# Patient Record
Sex: Male | Born: 1945 | Race: White | Hispanic: No | Marital: Single | Smoking: Former smoker
Health system: Southern US, Community
[De-identification: ages and names within clinical notes are randomized; demographics above are authoritative.]

## PROBLEM LIST (undated history)

## (undated) DIAGNOSIS — F329 Major depressive disorder, single episode, unspecified: Secondary | ICD-10-CM

## (undated) DIAGNOSIS — E785 Hyperlipidemia, unspecified: Secondary | ICD-10-CM

## (undated) DIAGNOSIS — R519 Headache, unspecified: Secondary | ICD-10-CM

## (undated) DIAGNOSIS — B1920 Unspecified viral hepatitis C without hepatic coma: Secondary | ICD-10-CM

## (undated) DIAGNOSIS — I639 Cerebral infarction, unspecified: Secondary | ICD-10-CM

## (undated) DIAGNOSIS — R51 Headache: Secondary | ICD-10-CM

## (undated) DIAGNOSIS — F32A Depression, unspecified: Secondary | ICD-10-CM

## (undated) HISTORY — PX: FOOT SURGERY: SHX648

## (undated) HISTORY — PX: CHOLECYSTECTOMY: SHX55

---

## 1997-06-30 ENCOUNTER — Ambulatory Visit (HOSPITAL_COMMUNITY): Admission: RE | Admit: 1997-06-30 | Discharge: 1997-06-30 | Payer: Self-pay | Admitting: Specialist

## 1997-07-12 ENCOUNTER — Ambulatory Visit (HOSPITAL_BASED_OUTPATIENT_CLINIC_OR_DEPARTMENT_OTHER): Admission: RE | Admit: 1997-07-12 | Discharge: 1997-07-12 | Payer: Self-pay | Admitting: Specialist

## 1998-02-04 ENCOUNTER — Emergency Department (HOSPITAL_COMMUNITY): Admission: EM | Admit: 1998-02-04 | Discharge: 1998-02-04 | Payer: Self-pay | Admitting: Emergency Medicine

## 1998-02-04 ENCOUNTER — Encounter: Payer: Self-pay | Admitting: Emergency Medicine

## 2005-08-29 ENCOUNTER — Encounter: Admission: RE | Admit: 2005-08-29 | Discharge: 2005-08-29 | Payer: Self-pay | Admitting: Orthopedic Surgery

## 2006-04-26 ENCOUNTER — Encounter: Admission: RE | Admit: 2006-04-26 | Discharge: 2006-04-26 | Payer: Self-pay | Admitting: Internal Medicine

## 2006-05-27 ENCOUNTER — Ambulatory Visit (HOSPITAL_COMMUNITY): Admission: RE | Admit: 2006-05-27 | Discharge: 2006-05-27 | Payer: Self-pay | Admitting: General Surgery

## 2006-06-26 ENCOUNTER — Ambulatory Visit: Payer: Self-pay | Admitting: Gastroenterology

## 2006-07-10 ENCOUNTER — Ambulatory Visit: Payer: Self-pay | Admitting: Gastroenterology

## 2006-08-01 ENCOUNTER — Encounter: Admission: RE | Admit: 2006-08-01 | Discharge: 2006-08-01 | Payer: Self-pay | Admitting: Internal Medicine

## 2007-06-17 ENCOUNTER — Encounter: Admission: RE | Admit: 2007-06-17 | Discharge: 2007-06-17 | Payer: Self-pay | Admitting: Internal Medicine

## 2008-07-14 ENCOUNTER — Ambulatory Visit (HOSPITAL_COMMUNITY): Admission: RE | Admit: 2008-07-14 | Discharge: 2008-07-14 | Payer: Self-pay | Admitting: General Surgery

## 2008-07-15 ENCOUNTER — Ambulatory Visit (HOSPITAL_COMMUNITY): Admission: RE | Admit: 2008-07-15 | Discharge: 2008-07-16 | Payer: Self-pay | Admitting: General Surgery

## 2008-07-15 ENCOUNTER — Encounter (INDEPENDENT_AMBULATORY_CARE_PROVIDER_SITE_OTHER): Payer: Self-pay | Admitting: General Surgery

## 2010-04-01 ENCOUNTER — Encounter (HOSPITAL_BASED_OUTPATIENT_CLINIC_OR_DEPARTMENT_OTHER): Payer: Self-pay | Admitting: Internal Medicine

## 2010-06-20 LAB — CBC
HCT: 42.3 % (ref 39.0–52.0)
Hemoglobin: 14.4 g/dL (ref 13.0–17.0)
MCHC: 34.1 g/dL (ref 30.0–36.0)
MCV: 83.9 fL (ref 78.0–100.0)
RBC: 5.04 MIL/uL (ref 4.22–5.81)

## 2010-06-20 LAB — DIFFERENTIAL
Basophils Absolute: 0 10*3/uL (ref 0.0–0.1)
Lymphocytes Relative: 23 % (ref 12–46)
Lymphs Abs: 1.8 10*3/uL (ref 0.7–4.0)
Neutro Abs: 4.8 10*3/uL (ref 1.7–7.7)
Neutrophils Relative %: 62 % (ref 43–77)

## 2010-06-20 LAB — COMPREHENSIVE METABOLIC PANEL
BUN: 7 mg/dL (ref 6–23)
CO2: 27 mEq/L (ref 19–32)
Calcium: 9.1 mg/dL (ref 8.4–10.5)
Chloride: 106 mEq/L (ref 96–112)
Creatinine, Ser: 0.89 mg/dL (ref 0.4–1.5)
GFR calc Af Amer: >60 mL/min (ref >60)
GFR calc non Af Amer: >60 mL/min (ref >60)
Glucose, Bld: 94 mg/dL (ref 70–99)
Total Bilirubin: 1.3 mg/dL — ABNORMAL HIGH (ref 0.3–1.2)

## 2010-06-20 LAB — PROTIME-INR
INR: 0.9 (ref 0.00–1.49)
Prothrombin Time: 12.5 seconds (ref 11.6–15.2)

## 2010-07-25 NOTE — Op Note (Signed)
NAME:  Neil Lambert, Neil Lambert NO.:  [id number]   MEDICAL RECORD NO.:  [id number]          PATIENT TYPE:  OUT   LOCATION:  CATS                         FACILITY:  [facility]   PHYSICIAN:  Neil Lambert, M.D.DATE OF BIRTH:  07-22-1945   DATE OF PROCEDURE:  07/15/2008  DATE OF DISCHARGE:  07/14/2008                               OPERATIVE REPORT   PREOPERATIVE DIAGNOSIS:  Symptomatic cholelithiasis.   POSTOPERATIVE DIAGNOSIS:  Symptomatic cholelithiasis.   PROCEDURE:  Laparoscopic cholecystectomy with intraoperative  cholangiogram.   SURGEON:  Neil Lambert, M.D.   ASSISTANT:  Neil Loron, MD   ANESTHESIA:  General.   INDICATIONS:  [name] is a 65 year old male who has known history of  gallstones.  He is been having intermittent episodes of right upper  quadrant pain at times.  He has mild elevation of some of his liver  function tests.  He does have a positive hepatitis C antibody.  Because  of this, it was felt that he may be symptomatic from his gallstones.  He  now presents for elective cholecystectomy.  We discussed the procedure,  risks, and aftercare preoperatively.   TECHNIQUE:  He was brought to the operating room, placed supine on the  operating table and general anesthetic was administered.  The abdominal  wall hair was clipped and the abdominal wall sterilely prepped and  draped.  Dilute Marcaine was infiltrated in the infraumbilical region.  A small infraumbilical incision was made through the skin, subcutaneous  tissue, fascia, and peritoneum entering the peritoneal cavity under  direct vision.  A  purse-string suture of 0 Vicryl was placed around the  fascial edges.  A Hasson trocar was introduced to the peritoneal cavity  and pneumoperitoneum was created by insufflation of CO2 gas.   Next laparoscope was introduced.  The liver did not appear to be  abnormal.  There were some adhesions between the omentum and the  gallbladder.  He  was placed in a reverse Trendelenburg position and  right side tilted slightly up.  An 11-mm trocar was placed through an  epigastric incision, and two 5-mm trocars were placed in the right upper  quadrant.  Using blunt dissection the omental adhesions were taken off  the gallbladder.  The fundus of the gallbladder was grasped and  retracted to the right shoulder.  Using blunt dissection, I mobilized  the infundibulum of the gallbladder.  I identified the cystic duct and a  cystic artery.  Using blunt dissection, windows were created around both  of these.  I then clipped the cystic duct at the cystic duct gallbladder  junction.  The cystic artery was clipped and divided.  A critical view  was achieved.  A small incision was made in the cystic duct and some  bile milked back from it.  A cholangiocath was passed to the anterior  abdominal wall and placed to the cystic duct and cholangiogram was  performed.   Under real-time fluoroscopy, dilute contrast was injected into the  cystic duct which was of moderate length.  The common hepatic, right  left hepatic, common bile ducts  were all filled and contrast drained  promptly into the duodenum.  I did not see any obvious obstruction  points.  Final reports pending radiologist's interpretation.   The cholangiocatheter was removed, the cystic duct was clipped 3 times  on the biliary side and divided.  Using electrocautery, the gallbladder  was dissected off from the liver intact and placed in Endopouch bag.   The gallbladder fossa was irrigated and bleeding points were controlled  with electrocautery.  Surgicel was placed in the gallbladder fossa.  The  gallbladder was then removed through the subumbilical port in the  Endopouch bag and the trocar replaced.   Then copiously irrigated out the pelvic area and hemostasis was  adequate.  The fluid was evacuated by way of suction.  I then removed  the Hasson trocar under direct vision, closed the  fascial defect by  tightening up and tying down the purse-string suture.  The remaining  trocars removed and pneumoperitoneum was released.   Skin incisions were closed with 4-0 Monocryl subcuticular stitches,  followed by Steri-Strips and sterile dressings.  He tolerated the  procedure without apparent complications and was taken to recovery in  satisfactory condition.      Neil Lambert, M.D.  Electronically Signed     TJR/MEDQ  D:  07/15/2008  T:  07/16/2008  Job:  [id number]   cc:   Neil Lambert. Neil Lambert, M.D.

## 2010-07-28 NOTE — Op Note (Signed)
NAME:  Neil Lambert, Neil Lambert              ACCOUNT NO.:  [id number]   MEDICAL RECORD NO.:  [id number]          PATIENT TYPE:  AMB   LOCATION:  DAY                          FACILITY:  [facility]   PHYSICIAN:  Timothy E. Earlene Plater, M.D. DATE OF BIRTH:  02-Aug-1945   DATE OF PROCEDURE:  05/27/2006  DATE OF DISCHARGE:                               OPERATIVE REPORT   PREOP DIAGNOSES:  1. Anal fissure.  2. Prolapsing hemorrhoid   PREOPERATIVE DIAGNOSES:  1. Anal fissure.  2. Prolapsing polyp.   OPERATIVE PROCEDURE:  1. Exam under anesthesia.  2. Proctoscopy.  3. Anoscopy with band ligation of polyp and repair of anal fissure.   SURGEON:  Timothy E. Earlene Plater, M.D.   ANESTHESIA:  General.   DESCRIPTION OF PROCEDURE:  Neil Lambert is 15.  He has a history of a  traumatic stroke.  He has recovered.  He is competent and fully  employed.  He lives alone.  He is stoic and takes care of his own  matters.  He has developed a painful anal condition, anal fissure, with  only partial response to diltiazem.  When seen and followed in the  office, he also had a prolapsing hemorrhoid.  His anorectal area was  tender, and difficult to examine; and so after careful explanation he  elected to proceed with this surgery.  He was seen, identified, and the  permit signed.   He was taken to the operating room.  He placed himself supine.  LMA  anesthesia provided.  He was gently raised to lithotomy.  The perianal  area was inspected, prepped and draped.  There was a prolapsing rectal  mucosal mass from the left posterior region of the anus.  The  proctoscope was introduced and advanced to 20 cm.  The rectal mucosa  being normal.  The scope removed.  Anoscope inserted and indeed the  prolapsing rectal mass was an inflammatory polyp, off to the tip and  left side of the anal fissure.   A band ligation device was applied on the rectal mucosa; and that  sufficiently reduced that mucosal mass.  The scope was turned and the  posterior anal fissure was cauterized.  It was minimal, at this point.  A left posterior internal sphincterotomy accomplished percutaneously  with a 15-blade.  Care taken to avoid the external sphincter which was  left intact.  Anal area injected round and about with Marcaine,  epinephrine, and Wydase, massaged in  well.  Gelfoam gauze and dry sterile dressing were applied.  He  tolerated it well; was removed to recovery room in good condition.  He  received a predrug counseling regards postop instructions, pain  medications, and printed information is given.  He will be followed by  phone and in the office as needed.      Timothy E. Earlene Plater, M.D.  Electronically Signed     TED/MEDQ  D:  05/27/2006  T:  05/27/2006  Job:  [id number]   cc:   Barry Dienes. Eloise Harman, M.D.  Fax: [phone]

## 2010-12-06 DIAGNOSIS — B182 Chronic viral hepatitis C: Secondary | ICD-10-CM | POA: Insufficient documentation

## 2011-03-27 ENCOUNTER — Encounter: Payer: Self-pay | Admitting: Gastroenterology

## 2013-12-15 DIAGNOSIS — G8190 Hemiplegia, unspecified affecting unspecified side: Secondary | ICD-10-CM | POA: Insufficient documentation

## 2015-10-27 ENCOUNTER — Ambulatory Visit: Payer: Self-pay | Admitting: Podiatry

## 2015-12-13 ENCOUNTER — Emergency Department (HOSPITAL_COMMUNITY): Payer: Medicare Other

## 2015-12-13 ENCOUNTER — Inpatient Hospital Stay (HOSPITAL_COMMUNITY)
Admission: EM | Admit: 2015-12-13 | Discharge: 2015-12-16 | DRG: 057 | Disposition: A | Discharge status: Skilled Nursing Facility | Payer: Medicare Other | Attending: Family Medicine | Admitting: Family Medicine

## 2015-12-13 ENCOUNTER — Encounter (HOSPITAL_COMMUNITY): Payer: Self-pay | Admitting: Emergency Medicine

## 2015-12-13 DIAGNOSIS — Z8673 Personal history of transient ischemic attack (TIA), and cerebral infarction without residual deficits: Secondary | ICD-10-CM | POA: Diagnosis not present

## 2015-12-13 DIAGNOSIS — M545 Low back pain: Secondary | ICD-10-CM | POA: Diagnosis present

## 2015-12-13 DIAGNOSIS — F32A Depression, unspecified: Secondary | ICD-10-CM

## 2015-12-13 DIAGNOSIS — G8191 Hemiplegia, unspecified affecting right dominant side: Secondary | ICD-10-CM

## 2015-12-13 DIAGNOSIS — R296 Repeated falls: Secondary | ICD-10-CM | POA: Diagnosis present

## 2015-12-13 DIAGNOSIS — F419 Anxiety disorder, unspecified: Secondary | ICD-10-CM | POA: Diagnosis present

## 2015-12-13 DIAGNOSIS — B1920 Unspecified viral hepatitis C without hepatic coma: Secondary | ICD-10-CM | POA: Diagnosis present

## 2015-12-13 DIAGNOSIS — R2681 Unsteadiness on feet: Secondary | ICD-10-CM | POA: Diagnosis not present

## 2015-12-13 DIAGNOSIS — E785 Hyperlipidemia, unspecified: Secondary | ICD-10-CM | POA: Diagnosis present

## 2015-12-13 DIAGNOSIS — Z885 Allergy status to narcotic agent status: Secondary | ICD-10-CM

## 2015-12-13 DIAGNOSIS — M4802 Spinal stenosis, cervical region: Secondary | ICD-10-CM | POA: Diagnosis present

## 2015-12-13 DIAGNOSIS — R29898 Other symptoms and signs involving the musculoskeletal system: Secondary | ICD-10-CM

## 2015-12-13 DIAGNOSIS — M25551 Pain in right hip: Secondary | ICD-10-CM | POA: Diagnosis present

## 2015-12-13 DIAGNOSIS — R740 Nonspecific elevation of levels of transaminase and lactic acid dehydrogenase [LDH]: Secondary | ICD-10-CM

## 2015-12-13 DIAGNOSIS — Z882 Allergy status to sulfonamides status: Secondary | ICD-10-CM

## 2015-12-13 DIAGNOSIS — R531 Weakness: Secondary | ICD-10-CM | POA: Diagnosis present

## 2015-12-13 DIAGNOSIS — Z66 Do not resuscitate: Secondary | ICD-10-CM | POA: Diagnosis present

## 2015-12-13 DIAGNOSIS — G8929 Other chronic pain: Secondary | ICD-10-CM | POA: Diagnosis present

## 2015-12-13 DIAGNOSIS — M479 Spondylosis, unspecified: Secondary | ICD-10-CM | POA: Diagnosis present

## 2015-12-13 DIAGNOSIS — R350 Frequency of micturition: Secondary | ICD-10-CM | POA: Diagnosis present

## 2015-12-13 DIAGNOSIS — R3915 Urgency of urination: Secondary | ICD-10-CM | POA: Diagnosis present

## 2015-12-13 DIAGNOSIS — S92514A Nondisplaced fracture of proximal phalanx of right lesser toe(s), initial encounter for closed fracture: Secondary | ICD-10-CM

## 2015-12-13 DIAGNOSIS — F329 Major depressive disorder, single episode, unspecified: Secondary | ICD-10-CM | POA: Diagnosis present

## 2015-12-13 DIAGNOSIS — Z9181 History of falling: Secondary | ICD-10-CM

## 2015-12-13 DIAGNOSIS — I69351 Hemiplegia and hemiparesis following cerebral infarction affecting right dominant side: Secondary | ICD-10-CM | POA: Diagnosis not present

## 2015-12-13 DIAGNOSIS — M21379 Foot drop, unspecified foot: Secondary | ICD-10-CM | POA: Diagnosis present

## 2015-12-13 DIAGNOSIS — Z87891 Personal history of nicotine dependence: Secondary | ICD-10-CM

## 2015-12-13 DIAGNOSIS — R7401 Elevation of levels of liver transaminase levels: Secondary | ICD-10-CM

## 2015-12-13 HISTORY — DX: Cerebral infarction, unspecified: I63.9

## 2015-12-13 HISTORY — DX: Headache, unspecified: R51.9

## 2015-12-13 HISTORY — DX: Headache: R51

## 2015-12-13 HISTORY — DX: Unspecified viral hepatitis C without hepatic coma: B19.20

## 2015-12-13 HISTORY — DX: Hyperlipidemia, unspecified: E78.5

## 2015-12-13 HISTORY — DX: Depression, unspecified: F32.A

## 2015-12-13 HISTORY — DX: Major depressive disorder, single episode, unspecified: F32.9

## 2015-12-13 LAB — COMPREHENSIVE METABOLIC PANEL
ALBUMIN: 3.2 g/dL — AB (ref 3.5–5.0)
ALT: 31 U/L (ref 17–63)
AST: 67 U/L — AB (ref 15–41)
Alkaline Phosphatase: 120 U/L (ref 38–126)
Anion gap: 12 (ref 5–15)
BUN: 6 mg/dL (ref 6–20)
CHLORIDE: 107 mmol/L (ref 101–111)
CO2: 19 mmol/L — ABNORMAL LOW (ref 22–32)
Calcium: 8.9 mg/dL (ref 8.9–10.3)
Creatinine, Ser: 0.69 mg/dL (ref 0.61–1.24)
GFR calc Af Amer: >60 mL/min (ref >60)
GFR calc non Af Amer: >60 mL/min (ref >60)
GLUCOSE: 85 mg/dL (ref 65–99)
POTASSIUM: 3.9 mmol/L (ref 3.5–5.1)
SODIUM: 138 mmol/L (ref 135–145)
Total Bilirubin: 3.2 mg/dL — ABNORMAL HIGH (ref 0.3–1.2)
Total Protein: 7.9 g/dL (ref 6.5–8.1)

## 2015-12-13 LAB — CBC
HCT: 45.6 % (ref 39.0–52.0)
HEMOGLOBIN: 15 g/dL (ref 13.0–17.0)
MCH: 32.9 pg (ref 26.0–34.0)
MCHC: 32.9 g/dL (ref 30.0–36.0)
MCV: 100 fL (ref 78.0–100.0)
Platelets: 134 10*3/uL — ABNORMAL LOW (ref 150–400)
RBC: 4.56 MIL/uL (ref 4.22–5.81)
RDW: 13.9 % (ref 11.5–15.5)
WBC: 7.9 10*3/uL (ref 4.0–10.5)

## 2015-12-13 LAB — I-STAT TROPONIN, ED: Troponin i, poc: 0 ng/mL (ref 0.00–0.08)

## 2015-12-13 LAB — DIFFERENTIAL
BASOS ABS: 0.1 10*3/uL (ref 0.0–0.1)
BASOS PCT: 1 %
EOS ABS: 0.2 10*3/uL (ref 0.0–0.7)
Eosinophils Relative: 3 %
Lymphocytes Relative: 33 %
Lymphs Abs: 2.6 10*3/uL (ref 0.7–4.0)
Monocytes Absolute: 1.2 10*3/uL — ABNORMAL HIGH (ref 0.1–1.0)
Monocytes Relative: 15 %
NEUTROS PCT: 48 %
Neutro Abs: 3.8 10*3/uL (ref 1.7–7.7)

## 2015-12-13 LAB — I-STAT CHEM 8, ED
BUN: 6 mg/dL (ref 6–20)
CHLORIDE: 106 mmol/L (ref 101–111)
Calcium, Ion: 1.04 mmol/L — ABNORMAL LOW (ref 1.15–1.40)
Creatinine, Ser: 0.7 mg/dL (ref 0.61–1.24)
GLUCOSE: 82 mg/dL (ref 65–99)
HEMATOCRIT: 49 % (ref 39.0–52.0)
Hemoglobin: 16.7 g/dL (ref 13.0–17.0)
POTASSIUM: 3.8 mmol/L (ref 3.5–5.1)
Sodium: 138 mmol/L (ref 135–145)
TCO2: 21 mmol/L (ref 0–100)

## 2015-12-13 LAB — PROTIME-INR
INR: 1.33
Prothrombin Time: 16.6 seconds — ABNORMAL HIGH (ref 11.4–15.2)

## 2015-12-13 LAB — APTT: APTT: 35 s (ref 24–36)

## 2015-12-13 MED ORDER — ESCITALOPRAM OXALATE 10 MG PO TABS
10.0000 mg | ORAL_TABLET | Freq: Every day | ORAL | Status: DC
Start: 2015-12-14 — End: 2015-12-16
  Administered 2015-12-14 – 2015-12-16 (×3): 10 mg via ORAL
  Filled 2015-12-13 (×4): qty 1

## 2015-12-13 MED ORDER — THIAMINE HCL 100 MG/ML IJ SOLN
100.0000 mg | Freq: Once | INTRAMUSCULAR | Status: AC
  Administered 2015-12-13: 100 mg via INTRAVENOUS
  Filled 2015-12-13: qty 2

## 2015-12-13 MED ORDER — ENOXAPARIN SODIUM 40 MG/0.4ML ~~LOC~~ SOLN
40.0000 mg | Freq: Every day | SUBCUTANEOUS | Status: DC
  Administered 2015-12-13 – 2015-12-15 (×3): 40 mg via SUBCUTANEOUS
  Filled 2015-12-13 (×3): qty 0.4

## 2015-12-13 MED ORDER — SODIUM CHLORIDE 0.9 % IV SOLN
INTRAVENOUS | Status: DC
  Administered 2015-12-13 – 2015-12-14 (×2): via INTRAVENOUS

## 2015-12-13 MED ORDER — ACETAMINOPHEN 650 MG RE SUPP: 650.0000 mg | Freq: Four times a day (QID) | RECTAL | Status: DC | PRN

## 2015-12-13 MED ORDER — ACETAMINOPHEN 325 MG PO TABS
650.0000 mg | ORAL_TABLET | Freq: Four times a day (QID) | ORAL | Status: DC | PRN
  Administered 2015-12-14: 650 mg via ORAL
  Filled 2015-12-13: qty 2

## 2015-12-13 NOTE — H&P (Signed)
Family Medicine [facility] Admission History and Physical Service Pager: [phone]  Patient name: Neil Lambert Medical record number: [id number] Date of birth: 08-12-1945 Age: 70 y.o. Gender: male  Primary Care Provider: Garlan FillersPATERSON,DANIEL G, MD Consultants: Neurology Code Status: DNR  Chief Complaint: weakness  Assessment and Plan: Neil Lambert is a 70 y.o. male presenting with increasing weakness. PMH is significant for depression, Hepatitis C, hyperlipidemia and stroke.    Weakness Worsening over past 3-4 months, with reported multiple falls recently. Residual R-sided weakness from prior CVA. With normal cranial nerve findings on physical exam.  2/5 weakness in right arm and right leg compared to left.  Decreased sensation over right LE. CT head with old infarcts but no acute intracranial abnormalities noted.  - Place in observation, attending Dr. Randolm IdolFletke -Neuro consulted, appreciate recs -Follow up UDS to r/o substance use that could be contributing to falls -Neuro checks Q2 x 12hours -PT eval and treat -OT eval and treat -Social work to see for possible placement given hx of falls at home/lives alone -Tylenol Q6 prn for mild pain -Vitals per unit routine  Depression -Cont home Lexapro -stable at current time  FEN/GI: Heart healthy, IVF @75cc /hr Prophylaxis: Lovenox  Disposition: Admit to med-surg for evaluation of weakness  History of Present Illness:  Neil Lambert is a 70 y.o. male presenting with increasing weakness and inability to walk over the past month.  Has been crawling in his home to get around and is afraid he will fall.  States he has residual weakness in right leg and right arm that is baseline from previous stroke 30 years ago.  Is able to ambulate but carefully/slowly with the help of holding onto objects. His fear of falling prevents him from entering his kitchen at times and patient states he fell multiple times in it.  Lives alone.  Girlfriend checks on him occasionally but does not live with him. Denies headache, blurry vision, and other focal neurologic findings. States recently he has some difficulty finding words and this is a new symptom for him so he came to ED.   Review Of Systems: Per HPI.     ROS  Patient Active Problem List   Diagnosis Date Noted  . Weakness 12/13/2015   Past Medical History: Past Medical History:  Diagnosis Date  . Depression   . HCV (hepatitis C virus)   . Headache   . Hyperlipidemia   . Stroke [facility](HCC)    Past Surgical History: Past Surgical History:  Procedure Laterality Date  . CHOLECYSTECTOMY    . FOOT SURGERY     with metal brace in but  not any more   worn  brace on rt foot for 6 yrs   Social History: Social History  Substance Use Topics  . Smoking status: Former Smoker    Types: Cigarettes  . Smokeless tobacco: Never Used  . Alcohol use 1.2 - 1.8 oz/week    2 - 3 Cans of beer per week     Comment: * packs a week   Family History: History reviewed. No pertinent family history.  Allergies and Medications: Allergies  Allergen Reactions  . Codeine Nausea And Vomiting and Other (See Comments)    Sick on my stomach  . Sulfa Antibiotics Nausea And Vomiting and Other (See Comments)    sick   No current facility-administered medications on file prior to encounter.    No current outpatient prescriptions on file prior to encounter.    Objective: BP  130/63 (BP Location: Left Arm)   Pulse 70   Temp 98 F (36.7 C) (Oral)   Resp 18   Ht 5\' 9"  (1.753 m)   Wt 145 lb 4.5 oz (65.9 kg)   SpO2 97%   BMI 21.45 kg/m  Exam: General: sitting upright in hospital bed in no acute distress Eyes: PERRLA, EOMI ENTM: MMM, tonsillar exudates Neck: supple, no lymphadenopathy Cardiovascular: RRR, no m/r/g Respiratory: CTA B/L, no wheezing noted Gastrointestinal: soft, NT, ND, no masses or organomegaly, +bs MSK: limited movement of right UE and right LE, right hand contracted  into a fist (states has bee Derm: no rashes or suspicious lesions, multiple scabs over bilateral knees secondary to recent falls Neuro: AOx3, able to follow commands, cranial nerves grossly intact, some difficulty expressing certain words, strength 5/5 on left UE/LE with good tone, strength 2/5 on right side LE/UE with slightly increased tone Psych: mood appropriate  Labs and Imaging: CBC BMET   Recent Labs Lab 12/13/15 1541 12/13/15 1556  WBC 7.9  --   HGB 15.0 16.7  HCT 45.6 49.0  PLT 134*  --     Recent Labs Lab 12/13/15 1541 12/13/15 1556  NA 138 138  K 3.9 3.8  CL 107 106  CO2 19*  --   BUN 6 6  CREATININE 0.69 0.70  GLUCOSE 85 82  CALCIUM 8.9  --      Marquette Saa, MD 12/14/2015, 12:16 AM PGY-1, [facility] FPTS Intern pager: [phone], text pages welcome  UPPER LEVEL ADDENDUM  I have read the above note and made revisions highlighted in orange.  Tarri Abernethy, MD, MPH PGY-2 [facility] Pager [phone]

## 2015-12-13 NOTE — ED Notes (Signed)
70 YO M with hx stroke with R sided deficits presents with weakness worsening over the last several months. LSW unknown. NIH 4, next neuro check due at 2200. Passed swallow screen.   [facility] Center[facility]ilie RN [phone]

## 2015-12-13 NOTE — ED Triage Notes (Signed)
Pt has been feeling weak, unable to walk over the past month- becoming increasingly weak. Pt has been crawling in his home to get around. Pt has hx of previous stroke. Pt states he has weakness in right leg and arm as baseline from previous stroke.

## 2015-12-13 NOTE — Progress Notes (Signed)
Dr Stewart at bedside.

## 2015-12-13 NOTE — ED Provider Notes (Signed)
[facility]ENCY DEPT Provider Note   CSN: [id number] Arrival date & time: 12/13/15  1450     History   Chief Complaint Chief Complaint  Patient presents with  . Weakness    HPI Neil Lambert is a 70 y.o. male.  The history is provided by the patient (girlfriend).  Weakness  Primary symptoms include focal weakness, loss of balance, speech change. The current episode started more than 1 week ago (x 1 month). The problem has not changed since onset.There was right lower extremity (worsened chronic RLE weakness to point of difficulty ambulating, now with LLE weakness as well) focality noted. There has been no fever. Associated symptoms include confusion. Pertinent negatives include no shortness of breath, no chest pain, no vomiting and no headaches. There were no medications administered prior to arrival. 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    Past Medical History:  Diagnosis Date  . Depression   . HCV (hepatitis C virus)   . Headache   . Hyperlipidemia   . Stroke [facility])     Patient Active Problem List   Diagnosis Date Noted  . Weakness 12/13/2015    Past Surgical History:  Procedure Laterality Date  . CHOLECYSTECTOMY    . FOOT SURGERY     with metal brace in but  not any more   worn  brace on rt foot for 6 yrs       Home Medications    Prior to Admission medications   Medication Sig Start Date End Date Taking? Authorizing Provider  aspirin-acetaminophen-caffeine (EXCEDRIN MIGRAINE) [id number] MG tablet Take 2 tablets by mouth every 6 (six) hours as needed for headache.   Yes Historical Provider, MD  diazepam (VALIUM) 5 MG tablet Take 5 mg by mouth every 12 (twelve) hours as needed for anxiety.    Yes Historical Provider, MD  escitalopram (LEXAPRO) 5 MG tablet Take 10 mg by mouth daily.   Yes Historical Provider, MD  oxyCODONE-acetaminophen (PERCOCET/ROXICET) 5-325 MG tablet Take 1 tablet  by mouth every 6 (six) hours as needed for severe pain.   Yes Historical Provider, MD  Probiotic Product (PROBIOTIC DAILY) CAPS Take 1 capsule by mouth daily.   Yes Historical Provider, MD    Family History History reviewed. No pertinent family history.  Social History Social History  Substance Use Topics  . Smoking status: Former Smoker    Types: Cigarettes  . Smokeless tobacco: Never Used  . Alcohol use 1.2 - 1.8 oz/week    2 - 3 Cans of beer per week     Comment: * packs a week     Allergies   Codeine and Sulfa antibiotics   Review of Systems Review of Systems  Constitutional: Positive for fatigue and unexpected weight change. Negative for diaphoresis and fever.       Lost 10 pounds in past 1 month, unable to get to kitchen for food as unable to walk. States only time he eats is when girlfriend brings him food  HENT: Negative for congestion.   Respiratory: Negative for chest tightness and shortness of breath.   Cardiovascular: Negative for chest pain.  Gastrointestinal: Negative for vomiting.  Genitourinary: Negative for flank pain.  Musculoskeletal: Positive for gait problem. Negative for back pain and neck pain.       Right hip, ankle, and foot pain  Skin: Negative for rash.  Neurological: Positive for speech change, focal weakness, speech difficulty, weakness and  loss of balance. Negative for facial asymmetry and headaches.  Psychiatric/Behavioral: Positive for confusion.     Physical Exam Updated Vital Signs BP 105/71 (BP Location: Left Arm)   Pulse 64   Temp 98.2 F (36.8 C) (Oral)   Resp 16   Ht 5\' 9"  (1.753 m)   Wt 65.9 kg   SpO2 97%   BMI 21.45 kg/m   Physical Exam  Constitutional: He is oriented to person, place, and time. He appears well-developed and well-nourished.  Disheveled appearance. Tremulous. Confused answers. Pauses mid-sentence forgetting what he was saying. Some expressive aphasia, but eventually finds correct words. Alert and oriented x  4. No slurring of speech.  HENT:  Head: Normocephalic and atraumatic.  Mildly dry mucous membranes  Eyes: Conjunctivae and EOM are normal. Pupils are equal, round, and reactive to light. No scleral icterus.  No nystagmus  Neck: Normal range of motion. Neck supple.  No c-spine TTP  Cardiovascular: Normal rate and regular rhythm.  Exam reveals no gallop and no friction rub.   Pulmonary/Chest: Effort normal and breath sounds normal. No respiratory distress. He exhibits no tenderness.  Abdominal: Soft. He exhibits no distension. There is no tenderness.  Musculoskeletal: He exhibits no edema, tenderness or deformity.  Neurological: He is alert and oriented to person, place, and time. No cranial nerve deficit. He exhibits normal muscle tone. Coordination abnormal.  Ataxia of LUE, unable to assess for ataxia of RUE 2/2 contracted RUE 2/2 old CVA. 2/5 strength RUE. 3/5 strength b/l Le's. Unable to assess dorsi and plantar flexion of RLE 2/2 orthopedic fixation from old CVA to aid ambulation.   Skin: Skin is warm and dry. Capillary refill takes less than 2 seconds. No rash noted. He is not diaphoretic.  Psychiatric: He has a normal mood and affect.  Nursing note and vitals reviewed.    ED Treatments / Results  Labs (all labs ordered are listed, but only abnormal results are displayed) Labs Reviewed  PROTIME-INR - Abnormal; Notable for the following:       Result Value   Prothrombin Time 16.6 (*)    All other components within normal limits  CBC - Abnormal; Notable for the following:    Platelets 134 (*)    All other components within normal limits  DIFFERENTIAL - Abnormal; Notable for the following:    Monocytes Absolute 1.2 (*)    All other components within normal limits  COMPREHENSIVE METABOLIC PANEL - Abnormal; Notable for the following:    CO2 19 (*)    Albumin 3.2 (*)    AST 67 (*)    Total Bilirubin 3.2 (*)    All other components within normal limits  I-STAT CHEM 8, ED -  Abnormal; Notable for the following:    Calcium, Ion 1.04 (*)    All other components within normal limits  APTT  URINE RAPID DRUG SCREEN, HOSP PERFORMED  URINALYSIS, ROUTINE W REFLEX MICROSCOPIC (NOT AT [facility])  I-STAT TROPOININ, ED  CBG MONITORING, ED    EKG  EKG Interpretation None       Radiology Dg Ankle Complete Right  Result Date: 12/13/2015 CLINICAL DATA:  Pain following recent falls EXAM: RIGHT ANKLE - COMPLETE 3+ VIEW COMPARISON:  None. FINDINGS: Frontal, oblique and lateral views obtained. There is soft tissue swelling, primarily laterally. No acute fracture or joint effusion evident. Ankle mortise appears intact. There is mild osteoarthritic change in the talonavicular joint region. No erosive change. IMPRESSION: Soft tissue swelling laterally. No fracture. Ankle mortise  appears intact. 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 Electronically Signed   By: Bretta Bang III M.D.   On: 12/13/2015 19:53   Ct Head Wo Contrast  Result Date: 12/13/2015 CLINICAL DATA:  Left-sided weakness, difficulty walking. EXAM: CT HEAD WITHOUT CONTRAST TECHNIQUE: Contiguous axial images were obtained from the base of the skull through the vertex without intravenous contrast. COMPARISON:  CT scan of Aug 01, 2006. FINDINGS: Brain: Old right frontal and left parietal infarctions are noted. No mass effect or midline shift is noted. Ventricular size is within normal limits. There is no evidence of mass lesion, hemorrhage or acute infarction. Vascular: Atherosclerosis of internal carotid arteries is noted. Skull: Bony calvarium appears intact. Sinuses/Orbits: Visualized paranasal sinuses are unremarkable. Other: None. IMPRESSION: Old right frontal and left parietal infarctions. No acute intracranial abnormality seen. Electronically Signed   By: Lupita Raider, M.D.   On: 12/13/2015 16:16   Dg Foot Complete Right  Result Date: 12/13/2015 CLINICAL DATA:  Pain following several recent  falls EXAM: RIGHT FOOT COMPLETE - 3+ VIEW COMPARISON:  None. FINDINGS: Frontal, oblique, and lateral views were obtained. There is a fracture of the proximal aspect of the fourth proximal phalanx with slight impaction at the fracture site. No other acute fracture is evident. No dislocation. There is evidence of remodeling in the cuboid bone consistent with prior trauma in this area. There is no dislocation. Joint spaces appear normal. No erosive change. IMPRESSION: Acute fracture proximal aspect fourth proximal phalanx with slight impaction at the fracture site. No other acute fracture evident. Old trauma with remodeling involving the cuboid bone. No dislocation. No appreciable joint space narrowing. Electronically Signed   By: Bretta Bang III M.D.   On: 12/13/2015 19:52   Dg Hip Unilat With Pelvis 2-3 Views Right  Result Date: 12/13/2015 CLINICAL DATA:  Several recent falls with pain EXAM: DG HIP (WITH OR WITHOUT PELVIS) 2-3V RIGHT COMPARISON:  None. FINDINGS: Frontal pelvis as well as frontal and lateral right hip images were obtained. There is no fracture or dislocation. There is mild narrowing of each hip joint. A lucency is noted in the superior left femoral head. IMPRESSION: No fracture or dislocation. Mild symmetric narrowing of both hip joints. Note that on the pelvic image, there is a lucency in the lateral left femoral head, potentially a focus of avascular necrosis. Note that MR is the imaging study of choice to assess for avascular necrosis. Electronically Signed   By: Bretta Bang III M.D.   On: 12/13/2015 19:50    Procedures Procedures (including critical care time)  Medications Ordered in ED Medications  escitalopram (LEXAPRO) tablet 10 mg (not administered)  enoxaparin (LOVENOX) injection 40 mg (40 mg Subcutaneous Given 12/13/15 2341)  0.9 %  sodium chloride infusion ( Intravenous Rate/Dose Verify 12/14/15 0014)  acetaminophen (TYLENOL) tablet 650 mg (not administered)    Or    acetaminophen (TYLENOL) suppository 650 mg (not administered)  thiamine (B-1) injection 100 mg (100 mg Intravenous Given 12/13/15 2009)     Initial Impression / Assessment and Plan / ED Course  I have reviewed the triage vital signs and the nursing notes.  Pertinent labs & imaging results that were available during my care of the patient were reviewed by me and considered in my medical decision making (see chart for details).  Clinical Course   LIEM COPENHAVER is a 70 y.o. 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 Pt had been crawling around home when his weakness of the RLE worsened beyond his baseline, but also started to experience new LLE weakness. Girlfriend states pt has had TNTC falls (daily) over the past 1 month. Pt's only pain is of right hip, ankle, and foot.   Unclear why pt waited so long for presentation, as he has been unable to crawl recently, no able to get food/water on his own without assistance. Pt has required for his girlfriend to come over to his home daily to feed him. Girlfriend reports an associated 10 pound weight loss. Noted to have h/o alcoholism, some word-finding difficulty and forgetting of speech mid-sentence on exam, some ataxia of LUE and diffuse tremors. Given thiamine IV empirically for ? Wernicke's encephalopathy. CT head without acute finding. Will need PT and OT assessment and further neurologic evaluation for new weakness and new mental status changes as an inpatient.  Pt condition, course, and admission were discussed with attending physician Dr. Margarita Grizzle.  Final Clinical Impressions(s) / ED Diagnoses   Final diagnoses:  Right hip pain  Generalized weakness    New Prescriptions Current Discharge Medication List       Horald Pollen, MD 12/14/15 1610    Margarita Grizzle, MD 12/15/15 [phone]

## 2015-12-13 NOTE — ED Notes (Signed)
Patient states his symptoms started x 3 months ago at check in.

## 2015-12-13 NOTE — Consult Note (Signed)
Admission H&P    Chief Complaint: Loss of ability to ambulate with the past 1-2 months.  HPI: Neil Lambert is an 71 y.o. 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 Patient attributes his increasing difficulty to worsening of weakness and control of his right lower extremity. He reportedly was walking independently as well as driving and able to do all of his ADLs until onset of his current problem. Patient reportedly lives alone. He has experienced some pain involving his left lower extremity but no weakness. Also experienced no weakness of his left upper extremity. His been no change in speech or swallowing. CT scan of his head showed old right frontal and left parietal infarctions. No acute changes were noted.  Past Medical History:  Diagnosis Date  . Hyperlipidemia   . Stroke [facility])     Past Surgical History:  Procedure Laterality Date  . CHOLECYSTECTOMY      History reviewed. No pertinent family history. Social History:  reports that he has quit smoking. His smoking use included Cigarettes. He has never used smokeless tobacco. He reports that he drinks alcohol. His drug history is not on file.  Allergies:  Allergies  Allergen Reactions  . Codeine Nausea And Vomiting and Other (See Comments)    Sick on my stomach  . Sulfa Antibiotics Nausea And Vomiting and Other (See Comments)    sick    Medications Prior to Admission  Medication Sig Dispense Refill  . aspirin-acetaminophen-caffeine (EXCEDRIN MIGRAINE) 250-250-65 MG tablet Take 2 tablets by mouth every 6 (six) hours as needed for headache.    . diazepam (VALIUM) 5 MG tablet Take 5 mg by mouth every 12 (twelve) hours as needed for anxiety.     Marland Kitchen escitalopram (LEXAPRO) 5 MG tablet Take 10 mg by mouth daily.    Marland Kitchen oxyCODONE-acetaminophen (PERCOCET/ROXICET) 5-325 MG  tablet Take 1 tablet by mouth every 6 (six) hours as needed for severe pain.    . Probiotic Product (PROBIOTIC DAILY) CAPS Take 1 capsule by mouth daily.      ROS: History obtained from the patient  General ROS: negative for - chills, fatigue, fever, night sweats, weight gain or weight loss Psychological ROS: negative for - behavioral disorder, hallucinations, memory difficulties, mood swings or suicidal ideation Ophthalmic ROS: negative for - blurry vision, double vision, eye pain or loss of vision ENT ROS: negative for - epistaxis, nasal discharge, oral lesions, sore throat, tinnitus or vertigo Allergy and Immunology ROS: negative for - hives or itchy/watery eyes Hematological and Lymphatic ROS: negative for - bleeding problems, bruising or swollen lymph nodes Endocrine ROS: negative for - galactorrhea, hair pattern changes, polydipsia/polyuria or temperature intolerance Respiratory ROS: negative for - cough, hemoptysis, shortness of breath or wheezing Cardiovascular ROS: negative for - chest pain, dyspnea on exertion, edema or irregular heartbeat Gastrointestinal ROS: negative for - abdominal pain, diarrhea, hematemesis, nausea/vomiting or stool incontinence Genito-Urinary ROS: negative for - dysuria, hematuria, incontinence or urinary frequency/urgency Musculoskeletal ROS: negative for - joint swelling or muscular weakness Neurological ROS: as noted in HPI Dermatological ROS: negative for rash and skin lesion changes  Physical Examination: Blood pressure 138/80, pulse 65, temperature 97.6 F (36.4 C), temperature source Oral, resp. rate 18, height '5\' 9"'$  (1.753 m), weight 65.9 kg (145 lb 4.5 oz), SpO2 100 %.  HEENT-  Normocephalic, no lesions, without obvious  abnormality.  Normal external eye and conjunctiva.  Normal TM's bilaterally.  Normal auditory canals and external ears. Normal external nose, mucus membranes and septum.  Normal pharynx. Neck supple with no masses, nodes, nodules  or enlargement. Cardiovascular - regular rate and rhythm, S1, S2 normal, no murmur, click, rub or gallop Lungs - chest clear, no wheezing, rales, normal symmetric air entry Abdomen - soft, non-tender; bowel sounds normal; no masses,  no organomegaly Extremities - no joint deformities, effusion, or inflammation  Neurologic Examination: Mental Status: Alert, oriented, thought content appropriate.  Speech fluent without evidence of aphasia. Able to follow commands without difficulty. Cranial Nerves: II-Visual fields were normal. III/IV/VI-Pupils were equal and reacted normally to light. Extraocular movements were full and conjugate.    V/VII-reduced perception of tactile sensation on the right side of the face compared to the left; mild right lower facial weakness. VIII-normal. X-normal speech and symmetrical palatal movement. XI: trapezius strength/neck flexion strength normal bilaterally XII-midline tongue extension with normal strength. Motor: Severe weakness proximally and distally of right upper and lower extremities with moderately increased tone throughout; normal strength and tone of left upper and lower extremities. Sensory: Normal throughout. Deep Tendon Reflexes: 2+ and symmetric. Plantars: Flexor on the left and mute on the right. Cerebellar: Normal finger-to-nose testing with use of left upper extremity. Carotid auscultation: Normal  Results for orders placed or performed during the hospital encounter of 12/13/15 (from the past 48 hour(s))  Protime-INR     Status: Abnormal   Collection Time: 12/13/15  3:41 PM  Result Value Ref Range   Prothrombin Time 16.6 (H) 11.4 - 15.2 seconds   INR 1.33   APTT     Status: None   Collection Time: 12/13/15  3:41 PM  Result Value Ref Range   aPTT 35 24 - 36 seconds  CBC     Status: Abnormal   Collection Time: 12/13/15  3:41 PM  Result Value Ref Range   WBC 7.9 4.0 - 10.5 K/uL   RBC 4.56 4.22 - 5.81 MIL/uL   Hemoglobin 15.0 13.0 -  17.0 g/dL   HCT 45.6 39.0 - 52.0 %   MCV 100.0 78.0 - 100.0 fL   MCH 32.9 26.0 - 34.0 pg   MCHC 32.9 30.0 - 36.0 g/dL   RDW 13.9 11.5 - 15.5 %   Platelets 134 (L) 150 - 400 K/uL  Differential     Status: Abnormal   Collection Time: 12/13/15  3:41 PM  Result Value Ref Range   Neutrophils Relative % 48 %   Neutro Abs 3.8 1.7 - 7.7 K/uL   Lymphocytes Relative 33 %   Lymphs Abs 2.6 0.7 - 4.0 K/uL   Monocytes Relative 15 %   Monocytes Absolute 1.2 (H) 0.1 - 1.0 K/uL   Eosinophils Relative 3 %   Eosinophils Absolute 0.2 0.0 - 0.7 K/uL   Basophils Relative 1 %   Basophils Absolute 0.1 0.0 - 0.1 K/uL  Comprehensive metabolic panel     Status: Abnormal   Collection Time: 12/13/15  3:41 PM  Result Value Ref Range   Sodium 138 135 - 145 mmol/L   Potassium 3.9 3.5 - 5.1 mmol/L   Chloride 107 101 - 111 mmol/L   CO2 19 (L) 22 - 32 mmol/L   Glucose, Bld 85 65 - 99 mg/dL   BUN 6 6 - 20 mg/dL   Creatinine, Ser 0.69 0.61 - 1.24 mg/dL   Calcium 8.9 8.9 - 10.3 mg/dL   Total Protein  7.9 6.5 - 8.1 g/dL   Albumin 3.2 (L) 3.5 - 5.0 g/dL   AST 67 (H) 15 - 41 U/L   ALT 31 17 - 63 U/L   Alkaline Phosphatase 120 38 - 126 U/L   Total Bilirubin 3.2 (H) 0.3 - 1.2 mg/dL   GFR calc non Af Amer >60 >60 mL/min   GFR calc Af Amer >60 >60 mL/min    Comment: (NOTE) The eGFR has been calculated using the CKD EPI equation. This calculation has not been validated in all clinical situations. eGFR's persistently <60 mL/min signify possible Chronic Kidney Disease.    Anion gap 12 5 - 15  I-stat troponin, ED     Status: None   Collection Time: 12/13/15  3:54 PM  Result Value Ref Range   Troponin i, poc 0.00 0.00 - 0.08 ng/mL   Comment 3            Comment: Due to the release kinetics of cTnI, a negative result within the first hours of the onset of symptoms does not rule out myocardial infarction with certainty. If myocardial infarction is still suspected, repeat the test at appropriate intervals.    I-Stat Chem 8, ED     Status: Abnormal   Collection Time: 12/13/15  3:56 PM  Result Value Ref Range   Sodium 138 135 - 145 mmol/L   Potassium 3.8 3.5 - 5.1 mmol/L   Chloride 106 101 - 111 mmol/L   BUN 6 6 - 20 mg/dL   Creatinine, Ser 2.80 0.61 - 1.24 mg/dL   Glucose, Bld 82 65 - 99 mg/dL   Calcium, Ion 7.22 (L) 1.15 - 1.40 mmol/L   TCO2 21 0 - 100 mmol/L   Hemoglobin 16.7 13.0 - 17.0 g/dL   HCT 69.1 98.0 - 48.7 %   Dg Ankle Complete Right  Result Date: 12/13/2015 CLINICAL DATA:  Pain following recent falls EXAM: RIGHT ANKLE - COMPLETE 3+ VIEW COMPARISON:  None. FINDINGS: Frontal, oblique and lateral views obtained. There is soft tissue swelling, primarily laterally. No acute fracture or joint effusion evident. Ankle mortise appears intact. There is mild osteoarthritic change in the talonavicular joint region. No erosive change. IMPRESSION: Soft tissue swelling laterally. No fracture. Ankle mortise appears intact. 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 Electronically Signed   By: Bretta Bang III M.D.   On: 12/13/2015 19:53   Ct Head Wo Contrast  Result Date: 12/13/2015 CLINICAL DATA:  Left-sided weakness, difficulty walking. EXAM: CT HEAD WITHOUT CONTRAST TECHNIQUE: Contiguous axial images were obtained from the base of the skull through the vertex without intravenous contrast. COMPARISON:  CT scan of Aug 01, 2006. FINDINGS: Brain: Old right frontal and left parietal infarctions are noted. No mass effect or midline shift is noted. Ventricular size is within normal limits. There is no evidence of mass lesion, hemorrhage or acute infarction. Vascular: Atherosclerosis of internal carotid arteries is noted. Skull: Bony calvarium appears intact. Sinuses/Orbits: Visualized paranasal sinuses are unremarkable. Other: None. IMPRESSION: Old right frontal and left parietal infarctions. No acute intracranial abnormality seen. Electronically Signed   By: Lupita Raider, M.D.   On:  12/13/2015 16:16   Dg Foot Complete Right  Result Date: 12/13/2015 CLINICAL DATA:  Pain following several recent falls EXAM: RIGHT FOOT COMPLETE - 3+ VIEW COMPARISON:  None. FINDINGS: Frontal, oblique, and lateral views were obtained. There is a fracture of the proximal aspect of the fourth proximal phalanx with slight impaction at the fracture site. No other  acute fracture is evident. No dislocation. There is evidence of remodeling in the cuboid bone consistent with prior trauma in this area. There is no dislocation. Joint spaces appear normal. No erosive change. IMPRESSION: Acute fracture proximal aspect fourth proximal phalanx with slight impaction at the fracture site. No other acute fracture evident. Old trauma with remodeling involving the cuboid bone. No dislocation. No appreciable joint space narrowing. Electronically Signed   By: Lowella Grip III M.D.   On: 12/13/2015 19:52   Dg Hip Unilat With Pelvis 2-3 Views Right  Result Date: 12/13/2015 CLINICAL DATA:  Several recent falls with pain EXAM: DG HIP (WITH OR WITHOUT PELVIS) 2-3V RIGHT COMPARISON:  None. FINDINGS: Frontal pelvis as well as frontal and lateral right hip images were obtained. There is no fracture or dislocation. There is mild narrowing of each hip joint. A lucency is noted in the superior left femoral head. IMPRESSION: No fracture or dislocation. Mild symmetric narrowing of both hip joints. Note that on the pelvic image, there is a lucency in the lateral left femoral head, potentially a focus of avascular necrosis. Note that MR is the imaging study of choice to assess for avascular necrosis. Electronically Signed   By: Lowella Grip III M.D.   On: 12/13/2015 19:50    Assessment/Plan 70 year old man with remote history of stroke with residual severe right hemiparesis admitted with progressive gait deterioration, etiology of which is unclear, but appears to involve increasing dysfunction of right lower extremity  primarily.  Recommendations: 1. MRI of the brain without contrast to rule out recurrent left cerebral infarction; stroke workup if acute stroke is illustrated on MRI study 2. Vitamin B-12 and folate levels 3. TSH and RPR 4. Physical therapy consult for gait evaluation and recommendations  We will continue to follow this patient with you.  C.R. Nicole Kindred, [place] [facility] [phone]  12/13/2015, 9:35 PM

## 2015-12-14 ENCOUNTER — Observation Stay (HOSPITAL_COMMUNITY): Payer: Medicare Other

## 2015-12-14 DIAGNOSIS — M21379 Foot drop, unspecified foot: Secondary | ICD-10-CM | POA: Diagnosis present

## 2015-12-14 DIAGNOSIS — M25551 Pain in right hip: Secondary | ICD-10-CM | POA: Diagnosis present

## 2015-12-14 DIAGNOSIS — G8929 Other chronic pain: Secondary | ICD-10-CM | POA: Diagnosis present

## 2015-12-14 DIAGNOSIS — E785 Hyperlipidemia, unspecified: Secondary | ICD-10-CM | POA: Diagnosis present

## 2015-12-14 DIAGNOSIS — R296 Repeated falls: Secondary | ICD-10-CM | POA: Diagnosis present

## 2015-12-14 DIAGNOSIS — R531 Weakness: Secondary | ICD-10-CM | POA: Diagnosis present

## 2015-12-14 DIAGNOSIS — R740 Nonspecific elevation of levels of transaminase and lactic acid dehydrogenase [LDH]: Secondary | ICD-10-CM

## 2015-12-14 DIAGNOSIS — R7401 Elevation of levels of liver transaminase levels: Secondary | ICD-10-CM

## 2015-12-14 DIAGNOSIS — B1920 Unspecified viral hepatitis C without hepatic coma: Secondary | ICD-10-CM | POA: Diagnosis present

## 2015-12-14 DIAGNOSIS — M545 Low back pain: Secondary | ICD-10-CM | POA: Diagnosis present

## 2015-12-14 DIAGNOSIS — G8191 Hemiplegia, unspecified affecting right dominant side: Secondary | ICD-10-CM

## 2015-12-14 DIAGNOSIS — M479 Spondylosis, unspecified: Secondary | ICD-10-CM | POA: Diagnosis present

## 2015-12-14 DIAGNOSIS — Z87891 Personal history of nicotine dependence: Secondary | ICD-10-CM | POA: Diagnosis not present

## 2015-12-14 DIAGNOSIS — F32A Depression, unspecified: Secondary | ICD-10-CM

## 2015-12-14 DIAGNOSIS — Z885 Allergy status to narcotic agent status: Secondary | ICD-10-CM | POA: Diagnosis not present

## 2015-12-14 DIAGNOSIS — M4802 Spinal stenosis, cervical region: Secondary | ICD-10-CM | POA: Diagnosis present

## 2015-12-14 DIAGNOSIS — Z66 Do not resuscitate: Secondary | ICD-10-CM | POA: Diagnosis present

## 2015-12-14 DIAGNOSIS — I69351 Hemiplegia and hemiparesis following cerebral infarction affecting right dominant side: Secondary | ICD-10-CM | POA: Diagnosis not present

## 2015-12-14 DIAGNOSIS — F329 Major depressive disorder, single episode, unspecified: Secondary | ICD-10-CM | POA: Diagnosis present

## 2015-12-14 DIAGNOSIS — Z9181 History of falling: Secondary | ICD-10-CM | POA: Diagnosis not present

## 2015-12-14 DIAGNOSIS — R350 Frequency of micturition: Secondary | ICD-10-CM | POA: Diagnosis present

## 2015-12-14 DIAGNOSIS — R3915 Urgency of urination: Secondary | ICD-10-CM | POA: Diagnosis present

## 2015-12-14 DIAGNOSIS — S92514A Nondisplaced fracture of proximal phalanx of right lesser toe(s), initial encounter for closed fracture: Secondary | ICD-10-CM

## 2015-12-14 DIAGNOSIS — Z8673 Personal history of transient ischemic attack (TIA), and cerebral infarction without residual deficits: Secondary | ICD-10-CM

## 2015-12-14 DIAGNOSIS — F419 Anxiety disorder, unspecified: Secondary | ICD-10-CM | POA: Diagnosis present

## 2015-12-14 DIAGNOSIS — Z882 Allergy status to sulfonamides status: Secondary | ICD-10-CM | POA: Diagnosis not present

## 2015-12-14 LAB — URINALYSIS, ROUTINE W REFLEX MICROSCOPIC
GLUCOSE, UA: NEGATIVE mg/dL
HGB URINE DIPSTICK: NEGATIVE
KETONES UR: NEGATIVE mg/dL
LEUKOCYTES UA: NEGATIVE
Nitrite: NEGATIVE
PROTEIN: NEGATIVE mg/dL
Specific Gravity, Urine: 1.019 (ref 1.005–1.030)
pH: 6 (ref 5.0–8.0)

## 2015-12-14 LAB — LIPID PANEL
Cholesterol: 126 mg/dL (ref 0–200)
HDL: 31 mg/dL — ABNORMAL LOW (ref >40)
LDL CALC: 85 mg/dL (ref 0–99)
TRIGLYCERIDES: 49 mg/dL (ref <150)
Total CHOL/HDL Ratio: 4.1 RATIO
VLDL: 10 mg/dL (ref 0–40)

## 2015-12-14 LAB — TSH: TSH: 2.523 u[IU]/mL (ref 0.350–4.500)

## 2015-12-14 LAB — FOLATE: FOLATE: 14.3 ng/mL (ref >5.9)

## 2015-12-14 LAB — VITAMIN B12: Vitamin B-12: 716 pg/mL (ref 180–914)

## 2015-12-14 LAB — RPR: RPR: NONREACTIVE

## 2015-12-14 MED ORDER — ENSURE ENLIVE PO LIQD
237.0000 mL | Freq: Three times a day (TID) | ORAL | Status: DC
  Administered 2015-12-14 – 2015-12-16 (×7): 237 mL via ORAL

## 2015-12-14 MED ORDER — SENNOSIDES-DOCUSATE SODIUM 8.6-50 MG PO TABS: 1.0000 | ORAL_TABLET | Freq: Once | ORAL | Status: DC

## 2015-12-14 MED ORDER — OXYCODONE-ACETAMINOPHEN 5-325 MG PO TABS
1.0000 | ORAL_TABLET | Freq: Four times a day (QID) | ORAL | Status: DC | PRN
  Administered 2015-12-14 – 2015-12-16 (×5): 1 via ORAL
  Filled 2015-12-14 (×5): qty 1

## 2015-12-14 MED ORDER — SENNOSIDES-DOCUSATE SODIUM 8.6-50 MG PO TABS
1.0000 | ORAL_TABLET | Freq: Two times a day (BID) | ORAL | Status: DC
  Administered 2015-12-14 – 2015-12-16 (×4): 1 via ORAL
  Filled 2015-12-14 (×4): qty 1

## 2015-12-14 MED ORDER — GADOBENATE DIMEGLUMINE 529 MG/ML IV SOLN
15.0000 mL | Freq: Once | INTRAVENOUS | Status: AC
  Administered 2015-12-14: 14 mL via INTRAVENOUS

## 2015-12-14 MED ORDER — ADULT MULTIVITAMIN W/MINERALS CH
1.0000 | ORAL_TABLET | Freq: Every day | ORAL | Status: DC
  Administered 2015-12-14 – 2015-12-16 (×3): 1 via ORAL
  Filled 2015-12-14 (×3): qty 1

## 2015-12-14 MED ORDER — ASPIRIN EC 81 MG PO TBEC
81.0000 mg | DELAYED_RELEASE_TABLET | Freq: Every day | ORAL | Status: DC
  Administered 2015-12-14 – 2015-12-16 (×3): 81 mg via ORAL
  Filled 2015-12-14 (×5): qty 1

## 2015-12-14 NOTE — Progress Notes (Signed)
Discussed patient with Dr Roda ShuttersXu, who agrees with post-op shoe.  Patient to follow up with him outpatient in about 1-2 weeks.  Appreciated recommendations.  Ashly M. Nadine CountsGottschalk, DO PGY-3, [facility]

## 2015-12-14 NOTE — Progress Notes (Signed)
RN spoke with admitting Resident who was informed of patients report of home medications, chronic pain and increased urinary frequency and intermittent burning with urination. U/A ordered. Patient will be given Tylenol 650 mg tab for chronic low back pain. RN will continue to monitor.

## 2015-12-14 NOTE — Progress Notes (Addendum)
Subjective: Patient continues to have complaints of weakness that has increased on his right lower extremity. He states that he has been falling frequently. He is to the point where he is crawling on the floor. He does admit to doing less activity secondary to his weakness. He is not specific and exactly what is weak but states he just feels unsteady.  Exam: Vitals:   12/14/15 0500 12/14/15 0732  BP: (!) 110/57 123/63  Pulse: (!) 57 67  Resp: 16 18  Temp: 98.5 F (36.9 C) 98.8 F (37.1 C)    HEENT-  Normocephalic, no lesions, without obvious abnormality.  Normal external eye and conjunctiva.  Normal TM's bilaterally.  Normal auditory canals and external ears. Normal external nose, mucus membranes and septum.  Normal pharynx. Cardiovascular- S1, S2 normal, pulses palpable throughout   Lungs- chest clear, no wheezing, rales, normal symmetric air entry, Heart exam - S1, S2 normal, no murmur, no gallop, rate regular Abdomen- normal findings: bowel sounds normal Extremities- no edema    Gen: In bed, NAD MS: Patient is alert and oriented follows all commands without difficulty. CN: Pupils are equal round reactive to light and accommodating. Patient has decreased sensation on the right side of his face. He has some left lower facial weakness. Tongue is midline. Motor: Patient has 5 out of 5 strength in his left upper and lower extremity. He has significant hemiparesis of his right upper extremity with flexion contracture of his hand along with 2/5 strength in his proximal right hip and knee flexion with no dorsi or plantar flexion of his ankle. Sensory: Decreased sensation along the right arm and leg.  Pertinent Labs/Diagnostics: MRI has been obtained awaiting formal reading. B12, folate, RPR, TSH are all pending.    Impression: 70 year old man with remote history of stroke with residual severe right hemiparesis admitted with progressive gait deterioration, etiology of which is unclear,  but appears to involve increasing dysfunction of right lower extremity primarily.  Recommendations: 1. MRI of the brain shows no acute infarct however awaiting formal reading.  We'll obtain MRI cervical spine as MRI brain does not show any significant etiology for his issues. 2. Vitamin B-12 and folate levels normal 3. TSH and RPR normal 4. Physical therapy consult for gait evaluation and recommendations  Felicie MornDavid Hettie Roselli PA-C [facility] [phone]   12/14/2015, 9:39 AM

## 2015-12-14 NOTE — Progress Notes (Signed)
Family Medicine Teaching Service Daily Progress Note Intern Pager: [phone]  Patient name: Neil Lambert Chapdelaine Medical record number: [id number] Date of birth: [birth year] Age: 70 y.o. Gender: male  Primary Care Provider: Garlan FillersPATERSON,DANIEL G, MD Consultants: Neurology Code Status: DNR  Pt Overview and Major Events to Date:  Admit 10/3  Assessment and Plan: Neil Lambert Molloy is a 70 y.o. male presenting with increasing weakness. PMH is significant for depression, Hepatitis Lambert, hyperlipidemia and stroke.    Weakness Worsening over past 3-4 months, with reported multiple falls recently. Reports able to walking independently as well as drive/other ADLs until onset of his current problem.  Has known residual R-sided weakness from prior CVA 30 years ago. With normal cranial nerve findings on physical exam.  2/5 weakness in right arm and right leg compared to left.  Decreased sensation over right LE. CT head with old infarcts but no acute intracranial abnormalities noted. Placed in obs. Neuro consulted, appreciate recs -- recommended labs (folate, RPR, lipid panel, vit b12) and MRI brain to r/o recurrent left cerebral infarct.  Consider stroke workup if acute stroke suspected on MRI. Patient getting MRI this AM.  -MRI negative, ordered Lambert-spine MRI per neurology reccs -Follow up UDS to r/o substance use that could be contributing to falls -Neuro checks Q2 x 12hours -PT eval & treat for gait evaluation and recommendations -OT eval and treat -Social work to see for possible placement given hx of falls at home/lives alone -Tylenol Q6 prn for mild pain -Vitals per unit routine -once stable for discharge, consider sending with aspirin and statin medication given prior history of CVA and hyperlipidemia.  Urinary frequency -Patient reports frequency/urgency of urination and intermittent burning with urination -UA ordered -continue to monitor  Chronic low back pain -at home on percoset/roxicet 5-325 and  valium 5 mg -Tylenol 650 mg tab -continue to monitor -consider adding back home pain medications if needed  Depression -Cont home Lexapro -stable at current time  FEN/GI: Heart healthy, IVF @75cc /hr Prophylaxis: Lovenox  Disposition: Admit to med-surg for evaluation of weakness  Subjective:  Patient states feeling the same weakness as last night. Appears anxious on exam.   Going for his MRI this AM.  Objective: Temp:  [97.4 F (36.3 Lambert)-98.8 F (37.1 Lambert)] 98.8 F (37.1 Lambert) (10/04 0732) Pulse Rate:  [57-71] 67 (10/04 0732) Resp:  [13-18] 18 (10/04 0732) BP: (105-144)/(52-81) 123/63 (10/04 0732) SpO2:  [94 %-100 %] 97 % (10/04 0732) Weight:  [145 lb 4.5 oz (65.9 kg)-150 lb (68 kg)] 145 lb 4.5 oz (65.9 kg) (10/03 2055)   Physical Exam: General: sitting upright in hospital bed in no acute distress Eyes: PERRLA, EOMI ENTM: MMM, tonsillar exudates Neck: supple, no LAD Cardiovascular: RRR, no m/r/g Respiratory: CTA B/L, no wheezing noted Gastrointestinal: soft, NT, ND, no masses or organomegaly, +bs MSK: limited movement of right UE and right LE, right hand contracted into a fist (states has always been this way) Derm: no rashes or suspicious lesions, multiple scabs over bilateral knees secondary to recent falls Neuro: AOx3, able to follow commands, cranial nerves grossly intact, some difficulty expressing certain words, strength 5/5 on left UE/LE with good tone, strength 2/5 on right side LE/UE with slightly increased tone Psych: mood appropriate  Laboratory:  Recent Labs Lab 12/13/15 1541 12/13/15 1556  WBC 7.9  --   HGB 15.0 16.7  HCT 45.6 49.0  PLT 134*  --     Recent Labs Lab 12/13/15 1541 12/13/15 1556  NA 138 138  K 3.9 3.8  CL 107 106  CO2 19*  --   BUN 6 6  CREATININE 0.69 0.70  CALCIUM 8.9  --   PROT 7.9  --   BILITOT 3.2*  --   ALKPHOS 120  --   ALT 31  --   AST 67*  --   GLUCOSE 85 82   Imaging/Diagnostic Tests:  Dg Ankle Complete  Right  Result Date: 12/13/2015 CLINICAL DATA:  Pain following recent falls EXAM: RIGHT ANKLE - COMPLETE 3+ VIEW COMPARISON:  None. FINDINGS: Frontal, oblique and lateral views obtained. There is soft tissue swelling, primarily laterally. No acute fracture or joint effusion evident. Ankle mortise appears intact. There is mild osteoarthritic change in the talonavicular joint region. No erosive change. IMPRESSION: Soft tissue swelling laterally. No fracture. Ankle mortise appears intact. 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 Electronically Signed   By: Bretta Bang III M.D.   On: 12/13/2015 19:53   Ct Head Wo Contrast  Result Date: 12/13/2015 CLINICAL DATA:  Left-sided weakness, difficulty walking. EXAM: CT HEAD WITHOUT CONTRAST TECHNIQUE: Contiguous axial images were obtained from the base of the skull through the vertex without intravenous contrast. COMPARISON:  CT scan of Aug 01, 2006. FINDINGS: Brain: Old right frontal and left parietal infarctions are noted. No mass effect or midline shift is noted. Ventricular size is within normal limits. There is no evidence of mass lesion, hemorrhage or acute infarction. Vascular: Atherosclerosis of internal carotid arteries is noted. Skull: Bony calvarium appears intact. Sinuses/Orbits: Visualized paranasal sinuses are unremarkable. Other: None. IMPRESSION: Old right frontal and left parietal infarctions. No acute intracranial abnormality seen. Electronically Signed   By: Lupita Raider, M.D.   On: 12/13/2015 16:16   Dg Foot Complete Right  Result Date: 12/13/2015 CLINICAL DATA:  Pain following several recent falls EXAM: RIGHT FOOT COMPLETE - 3+ VIEW COMPARISON:  None. FINDINGS: Frontal, oblique, and lateral views were obtained. There is a fracture of the proximal aspect of the fourth proximal phalanx with slight impaction at the fracture site. No other acute fracture is evident. No dislocation. There is evidence of remodeling in the cuboid  bone consistent with prior trauma in this area. There is no dislocation. Joint spaces appear normal. No erosive change. IMPRESSION: Acute fracture proximal aspect fourth proximal phalanx with slight impaction at the fracture site. No other acute fracture evident. Old trauma with remodeling involving the cuboid bone. No dislocation. No appreciable joint space narrowing. Electronically Signed   By: Bretta Bang III M.D.   On: 12/13/2015 19:52   Dg Hip Unilat With Pelvis 2-3 Views Right  Result Date: 12/13/2015 CLINICAL DATA:  Several recent falls with pain EXAM: DG HIP (WITH OR WITHOUT PELVIS) 2-3V RIGHT COMPARISON:  None. FINDINGS: Frontal pelvis as well as frontal and lateral right hip images were obtained. There is no fracture or dislocation. There is mild narrowing of each hip joint. A lucency is noted in the superior left femoral head. IMPRESSION: No fracture or dislocation. Mild symmetric narrowing of both hip joints. Note that on the pelvic image, there is a lucency in the lateral left femoral head, potentially a focus of avascular necrosis. Note that MR is the imaging study of choice to assess for avascular necrosis. Electronically Signed   By: Bretta Bang III M.D.   On: 12/13/2015 19:50   Freddrick March, MD 12/14/2015, 9:28 AM PGY-1, [facility]PT[facility] Intern pager: [phone], text pages welcome

## 2015-12-14 NOTE — Progress Notes (Signed)
Initial Nutrition Assessment   INTERVENTION:  Provide Ensure Enlive po TID, each supplement provides 350 kcal and 20 grams of protein Provide Multivitamin with minerals daily   NUTRITION DIAGNOSIS:   Inadequate oral intake related to social / environmental circumstances, acute illness as evidenced by per patient/family report.   GOAL:   Patient will meet greater than or equal to 90% of their needs   MONITOR:   PO intake, Supplement acceptance, Labs, Weight trends, Skin, I & O's  REASON FOR ASSESSMENT:   Malnutrition Screening Tool    ASSESSMENT:   70 y.o. male presenting with increasing weakness. PMH is significant for depression, Hepatitis C, hyperlipidemia and stroke.    Pt reports eating poorly for the past 3 months due to inability to prepare food. He reports inability to walk, requiring him to call to the kitchen. He reports eating mostly crackers with the occasional meal brought to him by his girlfriend. He thinks he has lost about 10 lbs in the past 3 months. Weight loss is not significant for time frame. Pt has some mild muscle wasting per nutrition-focused physical exam. He ate about 75% of a late breakfast, but was not hungry when lunch came. RD discussed the importance of adequate nutrition intake daily. Discussed a variety of healthful read-to-eat food options and recommended intake of nutritional supplements on days when PO intake is poor.   Labs reviewed.   Diet Order:  Diet vegetarian Room service appropriate? Yes; Fluid consistency: Thin  Skin:  Reviewed, no issues  Last BM:  10/4  Height:   Ht Readings from Last 1 Encounters:  12/13/15 5\' 9"  (1.753 m)    Weight:   Wt Readings from Last 1 Encounters:  12/13/15 145 lb 4.5 oz (65.9 kg)    Ideal Body Weight:  72.7 kg  BMI:  Body mass index is 21.45 kg/m.  Estimated Nutritional Needs:   Kcal:  1800-2000  Protein:  80-90 grams  Fluid:  1.8-2L/day  EDUCATION NEEDS:   No education needs  identified at this time  Dorothea Ogleeanne Macaria Bias RD, CSP, LDN Inpatient Clinical Dietitian Pager: [phone] After Hours Pager: [phone]

## 2015-12-14 NOTE — Progress Notes (Signed)
MRI ordered per Dr. Roseanne RenoStewart. RN will continue to monitor.

## 2015-12-14 NOTE — Evaluation (Signed)
Physical Therapy Evaluation Patient Details Name: Neil Lambert MRN: [id number] DOB: Oct 15, 1945 Today's Date: 12/14/2015   History of Present Illness  70 y.o. male presenting with increasing weakness and inability to walk over the past month.  Has been crawling in his home to get around and is afraid he will fall.  States he has residual weakness in right leg and right arm that is baseline from previous stroke 30 years ago. MRI on 10/4 negative for acute infarct. 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  Clinical Impression  .Pt admitted with/for increased weakness and inability to walk.  Pt currently limited functionally due to the problems listed. ( See problems list.)   Pt will benefit from PT to maximize function and safety in order to get ready for next venue listed below. Pt is very fearful of mobilizing on his feet and needs 2 person mod assist.     Follow Up Recommendations SNF    Equipment Recommendations  None recommended by PT (TBA)    Recommendations for Other Services       Precautions / Restrictions Precautions Precautions: Fall Restrictions Weight Bearing Restrictions: No      Mobility  Bed Mobility Overal bed mobility: Needs Assistance Bed Mobility: Supine to Sit     Supine to sit: Min guard;HOB elevated     General bed mobility comments: Close min guard for safety with increased time to perform bed mobility. HOB elevated with use of bed rails to assist with bed mobility.  Transfers Overall transfer level: Needs assistance Equipment used: Rolling walker (2 wheeled);2 person hand held assist Transfers: Sit to/from [employer] Sit to Stand: Mod assist;+2 physical assistance Stand pivot transfers: Mod assist;+2 safety/equipment       General transfer comment: Multiple trials of sit to stand performed; x2 with hand held assist and x1 with RW. Pt very fearful of movement; seems to be concerns of  falls.  Ambulation/Gait             General Gait Details: unable on evaluation  Stairs            Wheelchair Mobility    Modified Rankin (Stroke Patients Only)       Balance Overall balance assessment: Needs assistance Sitting-balance support: Feet supported;No upper extremity supported Sitting balance-Leahy Scale: Fair     Standing balance support: Bilateral upper extremity supported Standing balance-Leahy Scale: Poor Standing balance comment: heavy list to the left with difficulty shifting weight back to the R LE                             Pertinent Vitals/Pain Pain Assessment: Faces Faces Pain Scale: No hurt    Home Living Family/patient expects to be discharged to:: Private residence Living Arrangements: Alone Available Help at Discharge: Friend(s);Available PRN/intermittently (girlfriend checks in on him every couple days) Type of Home: House Home Access: Stairs to enter Entrance Stairs-Rails: None Entrance Stairs-Number of Steps: 2 Home Layout: One level Home Equipment: Walker - 2 wheels;Cane - single point;Cane - quad;Wheelchair - manual      Prior Function Level of Independence: Needs assistance         Comments: Pt reports he has been crawling in the house for mobility over the past ~1 month. Has been taking sponge baths.     Hand Dominance   Dominant Hand: Left (Was R handed prior to CVA 30 yrs ago)  Extremity/Trunk Assessment   Upper Extremity Assessment: RUE deficits/detail RUE Deficits / Details: Limited ROM but does initiate movement. Poor grip strength and limited finger extension/flexion. Unable to grip RW without hand over hand assist. Pt reports deficits present PTA from prior CVA. Unsure if symptoms worse now than PTA.         Lower Extremity Assessment: RLE deficits/detail RLE Deficits / Details: moves in synergy with little isolation of movements.  Can bear weight on R, but unable to shift his weight over  onto the R LE    Cervical / Trunk Assessment: Kyphotic  Communication   Communication: No difficulties  Cognition Arousal/Alertness: Awake/alert Behavior During Therapy: Anxious Overall Cognitive Status: Impaired/Different from baseline Area of Impairment: Attention;Memory;Safety/judgement;Problem solving;Awareness   Current Attention Level: Sustained Memory: Decreased short-term memory   Safety/Judgement: Decreased awareness of safety;Decreased awareness of deficits Awareness: Emergent Problem Solving: Decreased initiation;Requires verbal cues;Requires tactile cues General Comments: Pt extremely fearful of falling. Repeating himself multiple times throughout session.    General Comments General comments (skin integrity, edema, etc.): pt very fearful of mobilizing on his feet.    Exercises     Assessment/Plan    PT Assessment Patient needs continued PT services  PT Problem List Decreased strength;Decreased activity tolerance;Decreased balance;Decreased mobility;Decreased coordination;Decreased knowledge of use of DME          PT Treatment Interventions Gait training;DME instruction;Functional mobility training;Therapeutic activities;Balance training;Patient/family education;Neuromuscular re-education    PT Goals (Current goals can be found in the Care Plan section)  Acute Rehab PT Goals Patient Stated Goal: none stated PT Goal Formulation: With patient Time For Goal Achievement: 12/21/15 Potential to Achieve Goals: Good    Frequency Min 3X/week   Barriers to discharge Decreased caregiver support Is alone and fearful of mobilizing unless crawling on the floor.    Co-evaluation               End of Session   Activity Tolerance: Patient tolerated treatment well;Other (comment) (limited by weakness and fear.) Patient left: in chair;with call bell/phone within reach Nurse Communication: Mobility status    Functional Assessment Tool Used: clinical  judgement Functional Limitation: Mobility: Walking and moving around Mobility: Walking and Moving Around Current Status ([id number]): At least 40 percent but less than 60 percent impaired, limited or restricted Mobility: Walking and Moving Around Goal Status [phone]([id number]): At least 20 percent but less than 40 percent impaired, limited or restricted    Time: [id number] PT Time Calculation (min) (ACUTE ONLY): 35 min   Charges:   PT Evaluation $PT Eval Moderate Complexity: 1 Procedure     PT G Codes:   PT G-Codes **NOT FOR INPATIENT CLASS** Functional Assessment Tool Used: clinical judgement Functional Limitation: Mobility: Walking and moving around Mobility: Walking and Moving Around Current Status ([id number](G8978): At least 40 percent but less than 60 percent impaired, limited or restricted Mobility: Walking and Moving Around Goal Status [phone]([id number]): At least 20 percent but less than 40 percent impaired, limited or restricted    Kamen Hanken, Eliseo GumKenneth V 12/14/2015, 4:06 PM 12/14/2015  [place] BingKen Frank Novelo, PT [phone]  (pager)

## 2015-12-14 NOTE — Evaluation (Signed)
Occupational Therapy Evaluation Patient Details Name: Neil Lambert MRN: [id number] DOB: Mar 13, 1945 Today's Date: 12/14/2015    History of Present Illness 70 y.o. male presenting with increasing weakness and inability to walk over the past month.  Has been crawling in his home to get around and is afraid he will fall.  States he has residual weakness in right leg and right arm that is baseline from previous stroke 30 years ago. MRI on 10/4 negative for acute infarct. 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   Clinical Impression   Pt reports he was managing ADL independently PTA; he was sponge bathing and crawling to bathroom for toileting. Currently pt mod assist +2 for basic transfers and overall max assist for ADL. Pt presenting with cognitive impairments, RUE weakness/impaired coordination, balance deficits in standing impacting his independence and safety with ADL and functional mobility. Pt reports multiple falls at home PTA and appears very fearful of falls currently. Recommending SNF for follow up to maximize independence and safety with ADL and functional mobility prior to return home. Pt would benefit from continued skilled OT to address established goals.    Follow Up Recommendations  SNF;Supervision/Assistance - 24 hour    Equipment Recommendations  Other (comment) (TBD at next venue)    Recommendations for Other Services Speech consult     Precautions / Restrictions Precautions Precautions: Fall Restrictions Weight Bearing Restrictions: No      Mobility Bed Mobility Overal bed mobility: Needs Assistance Bed Mobility: Supine to Sit     Supine to sit: Min guard;HOB elevated     General bed mobility comments: Close min guard for safety with increased time to perform bed mobility. HOB elevated with use of bed rails to assist with bed mobility.  Transfers Overall transfer level: Needs assistance Equipment used: Rolling walker (2 wheeled);2  person hand held assist Transfers: Sit to/from [employer] Sit to Stand: Mod assist;+2 physical assistance Stand pivot transfers: Mod assist;+2 safety/equipment       General transfer comment: Multiple trials of sit to stand performed; x2 with hand held assist and x1 with RW. Pt very fearful of movement; seems to be concerns of falls.    Balance Overall balance assessment: Needs assistance;History of Falls Sitting-balance support: Feet supported;Single extremity supported Sitting balance-Leahy Scale: Fair     Standing balance support: Bilateral upper extremity supported Standing balance-Leahy Scale: Poor                              ADL Overall ADL's : Needs assistance/impaired Eating/Feeding: Set up;Sitting   Grooming: Minimal assistance;Sitting   Upper Body Bathing: Minimal assitance;Sitting   Lower Body Bathing: Maximal assistance;+2 for physical assistance;Sit to/from stand   Upper Body Dressing : Minimal assistance;Sitting   Lower Body Dressing: Maximal assistance;+2 for physical assistance;Sit to/from stand Lower Body Dressing Details (indicate cue type and reason): Pt able to pull up bil socks sitting EOB Toilet Transfer: Moderate assistance;+2 for physical assistance;+2 for safety/equipment;Stand-pivot;BSC Toilet Transfer Details (indicate cue type and reason): Simulated by sit to stand from EOB with stand pivot to chair. Toileting- Clothing Manipulation and Hygiene: Maximal assistance;+2 for physical assistance;Sit to/from stand       Functional mobility during ADLs: Moderate assistance;+2 for physical assistance;Rolling walker;+2 for safety/equipment;Cueing for sequencing (for 2 steps forward/backward and stand pivot) General ADL Comments: Pt very fearful of falls; seems to be limiting participation in functional mobility. Pt  with difficulty attending to tasks; requires max verbal and visual cues for sequencing and initiation.      Vision Additional Comments: Difficult to assess due to impaired cognition. Needs further assessment functionally.   Perception     Praxis      Pertinent Vitals/Pain Pain Assessment: Faces Faces Pain Scale: No hurt     Hand Dominance Left (Was R handed prior to CVA 30 yrs ago)   Extremity/Trunk Assessment Upper Extremity Assessment Upper Extremity Assessment: RUE deficits/detail RUE Deficits / Details: Limited ROM but does initiate movement. Poor grip strength and limited finger extension/flexion. Unable to grip RW without hand over hand assist. Pt reports deficits present PTA from prior CVA. Unsure if symptoms worse now than PTA. RUE Coordination: decreased fine motor;decreased gross motor   Lower Extremity Assessment Lower Extremity Assessment: Defer to PT evaluation   Cervical / Trunk Assessment Cervical / Trunk Assessment: Kyphotic   Communication Communication Communication: No difficulties   Cognition Arousal/Alertness: Awake/alert Behavior During Therapy: Anxious Overall Cognitive Status: Impaired/Different from baseline Area of Impairment: Attention;Memory;Safety/judgement;Problem solving;Awareness   Current Attention Level: Sustained Memory: Decreased short-term memory   Safety/Judgement: Decreased awareness of safety;Decreased awareness of deficits Awareness: Emergent Problem Solving: Decreased initiation;Requires verbal cues;Requires tactile cues General Comments: Pt extremely fearful of falling. Repeating himself multiple times throughout session.   General Comments       Exercises       Shoulder Instructions      Home Living Family/patient expects to be discharged to:: Private residence Living Arrangements: Alone Available Help at Discharge: Friend(s);Available PRN/intermittently (girlfriend checks in on him every couple days) Type of Home: House Home Access: Stairs to enter [employer] of Steps: 2 Entrance Stairs-Rails:  None Home Layout: One level     Bathroom Shower/Tub: Tub/shower unit Shower/tub characteristics: Curtain Firefighter: Standard     Home Equipment: Environmental consultant - 2 wheels;Cane - single point;Cane - quad;Wheelchair - manual          Prior Functioning/Environment Level of Independence: Needs assistance        Comments: Pt reports he has been crawling in the house for mobility over the past ~1 month. Has been taking sponge baths.        OT Problem List: Decreased strength;Decreased range of motion;Decreased activity tolerance;Impaired balance (sitting and/or standing);Decreased coordination;Decreased cognition;Decreased safety awareness;Decreased knowledge of use of DME or AE;Decreased knowledge of precautions;Impaired tone;Impaired UE functional use   OT Treatment/Interventions: Self-care/ADL training;Neuromuscular education;Therapeutic exercise;Energy conservation;DME and/or AE instruction;Therapeutic activities;Cognitive remediation/compensation;Patient/family education;Balance training    OT Goals(Current goals can be found in the care plan section) Acute Rehab OT Goals Patient Stated Goal: none stated OT Goal Formulation: With patient Time For Goal Achievement: 12/28/15 Potential to Achieve Goals: Good ADL Goals Pt Will Perform Grooming: with set-up;sitting Pt Will Transfer to Toilet: with min assist;ambulating;bedside commode Pt Will Perform Toileting - Clothing Manipulation and hygiene: with min assist;sit to/from stand Additional ADL Goal #1: Pt will demonstrate selective attention during ADL with min verbal cues in a minimally distracting environment,  OT Frequency: Min 2X/week   Barriers to D/C: Inaccessible home environment;Decreased caregiver support  2 steps into home, pt lives alone       Co-evaluation              End of Session Equipment Utilized During Treatment: Rolling walker  Activity Tolerance: Patient tolerated treatment well Patient left: in  chair;with call bell/phone within reach;with chair alarm set   Time: 1421-1453 OT Time Calculation (min): 32 min  Charges:  OT General Charges $OT Visit: 1 Procedure OT Evaluation $OT Eval Moderate Complexity: 1 Procedure G-Codes: OT G-codes **NOT FOR INPATIENT CLASS** Functional Assessment Tool Used: Clinical judgement Functional Limitation: Self care Self Care Current Status (Z6109(G8987): At least 40 percent but less than 60 percent impaired, limited or restricted Self Care Goal Status (U0454(G8988): At least 20 percent but less than 40 percent impaired, limited or restricted   Gaye AlkenBailey A Aalina Brege M.S., OTR/L Pager: [phone]  12/14/2015, 3:17 PM

## 2015-12-14 NOTE — Progress Notes (Signed)
Responded to consult for advanced directive. Left document with patient to review. Chaplain to return to answer questions.

## 2015-12-14 NOTE — Progress Notes (Signed)
RN spoke with Family medicine.On-call Neurologist paged for further instructions/orders.

## 2015-12-14 NOTE — Progress Notes (Signed)
Responded to consult for advanced directive. Patient to review document, chaplain to return later to answer any questions.    12/14/15 1100  Clinical Encounter Type  Visited With Patient  Visit Type Initial  Referral From Nurse  Spiritual Encounters  Spiritual Needs Literature;Emotional  Stress Factors  Patient Stress Factors Health changes

## 2015-12-15 ENCOUNTER — Inpatient Hospital Stay (HOSPITAL_COMMUNITY): Payer: Medicare Other

## 2015-12-15 DIAGNOSIS — R29898 Other symptoms and signs involving the musculoskeletal system: Secondary | ICD-10-CM

## 2015-12-15 DIAGNOSIS — R2681 Unsteadiness on feet: Secondary | ICD-10-CM

## 2015-12-15 LAB — CBC
HCT: 38.8 % — ABNORMAL LOW (ref 39.0–52.0)
Hemoglobin: 12.3 g/dL — ABNORMAL LOW (ref 13.0–17.0)
MCH: 31.9 pg (ref 26.0–34.0)
MCHC: 31.7 g/dL (ref 30.0–36.0)
MCV: 100.5 fL — ABNORMAL HIGH (ref 78.0–100.0)
PLATELETS: 124 10*3/uL — AB (ref 150–400)
RBC: 3.86 MIL/uL — AB (ref 4.22–5.81)
RDW: 14 % (ref 11.5–15.5)
WBC: 5.6 10*3/uL (ref 4.0–10.5)

## 2015-12-15 LAB — BASIC METABOLIC PANEL
ANION GAP: 3 — AB (ref 5–15)
BUN: 8 mg/dL (ref 6–20)
CALCIUM: 8.6 mg/dL — AB (ref 8.9–10.3)
CO2: 27 mmol/L (ref 22–32)
CREATININE: 0.69 mg/dL (ref 0.61–1.24)
Chloride: 105 mmol/L (ref 101–111)
Glucose, Bld: 102 mg/dL — ABNORMAL HIGH (ref 65–99)
Potassium: 4 mmol/L (ref 3.5–5.1)
SODIUM: 135 mmol/L (ref 135–145)

## 2015-12-15 MED ORDER — LORAZEPAM 1 MG PO TABS: 1.0000 mg | ORAL_TABLET | Freq: Four times a day (QID) | ORAL | Status: DC | PRN

## 2015-12-15 MED ORDER — DIAZEPAM 5 MG PO TABS
5.0000 mg | ORAL_TABLET | Freq: Every day | ORAL | Status: DC
  Administered 2015-12-15 – 2015-12-16 (×2): 5 mg via ORAL
  Filled 2015-12-15 (×2): qty 1

## 2015-12-15 MED ORDER — LORAZEPAM 2 MG/ML IJ SOLN: 1.0000 mg | Freq: Four times a day (QID) | INTRAMUSCULAR | Status: DC | PRN

## 2015-12-15 MED ORDER — THIAMINE HCL 100 MG/ML IJ SOLN
100.0000 mg | Freq: Every day | INTRAMUSCULAR | Status: DC
  Administered 2015-12-15 – 2015-12-16 (×2): 100 mg via INTRAVENOUS
  Filled 2015-12-15 (×2): qty 2

## 2015-12-15 NOTE — Progress Notes (Signed)
Chaplain provided follow up for completion of Advance Directive. The patient had not completed the form, however his girlfriend was assisting the patient, and was instructing to have staff contact Chaplains when completed. Chaplain Janell QuietAudrey Antwone Capozzoli [phone]

## 2015-12-15 NOTE — Clinical Social Work Placement (Signed)
   CLINICAL SOCIAL WORK PLACEMENT  NOTE  Date:  12/15/2015  Patient Details  Name: Neil Lambert MRN: [id number] Date of Birth: 1945/09/05  Clinical Social Work is seeking post-discharge placement for this patient at the Skilled  Nursing Facility level of care (*CSW will initial, date and re-position this form in  chart as items are completed):  Yes   Patient/family provided with [facility] Clinical Social Work Department's list of facilities offering this level of care within the geographic area requested by the patient (or if unable, by the patient's family).  Yes   Patient/family informed of their freedom to choose among providers that offer the needed level of care, that participate in Medicare, Medicaid or managed care program needed by the patient, have an available bed and are willing to accept the patient.  Yes   Patient/family informed of [facility]'s ownership interest in [facility] and [facility], as well as of the fact that they are under no obligation to receive care at these facilities.  PASRR submitted to EDS on 12/15/15     PASRR number received on 12/15/15     Existing PASRR number confirmed on       FL2 transmitted to all facilities in geographic area requested by pt/family on 12/15/15     FL2 transmitted to all facilities within larger geographic area on       Patient informed that his/her managed care company has contracts with or will negotiate with certain facilities, including the following:            Patient/family informed of bed offers received.  Patient chooses bed at       Physician recommends and patient chooses bed at      Patient to be transferred to   on  .  Patient to be transferred to facility by       Patient family notified on   of transfer.  Name of family member notified:        PHYSICIAN       Additional Comment:    _______________________________________________ Neil QuerySarah Riel Hirschman, LCSW 12/15/2015, 4:11 PM

## 2015-12-15 NOTE — NC FL2 (Signed)
[place] MEDICAID FL2 LEVEL OF CARE SCREENING TOOL     IDENTIFICATION  Patient Name: Neil Lambert Birthdate: April 02, 1945 Sex: male Admission Date (Current Location): 12/13/2015  [facility] and [place] Number:  Producer, television/film/video and Address:  [facility], [address], [place], [place] [address]      Provider Number: [id number]  Attending Physician Name and Address:  Uvaldo Rising, MD  Relative Name and Phone Number:       Current Level of Care: Hospital Recommended Level of Care: Skilled Nursing Facility Prior Approval Number:    Date Approved/Denied:   PASRR Number: [id number]  Discharge Plan: SNF    Current Diagnoses: Patient Active Problem List   Diagnosis Date Noted  . Elevated AST (SGOT) 12/14/2015  . Depression 12/14/2015  . History of CVA (cerebrovascular accident) 12/14/2015  . Right hemiplegia (HCC)   . Right hip pain   . Nondisplaced fracture of proximal phalanx of right lesser toe(s), initial encounter for closed fracture   . Weakness 12/13/2015  . Hemiparesis (HCC) 12/15/2013  . Hepatitis C, chronic (HCC) 12/06/2010    Orientation RESPIRATION BLADDER Height & Weight     Self, Time, Situation, Place  Normal   Weight: 145 lb 4.5 oz (65.9 kg) Height:  5\' 9"  (175.3 cm)  BEHAVIORAL SYMPTOMS/MOOD NEUROLOGICAL BOWEL NUTRITION STATUS        Diet (Vegetarian, Thin Liquids)  AMBULATORY STATUS COMMUNICATION OF NEEDS Skin   Extensive Assist Verbally Normal                       Personal Care Assistance Level of Assistance  Bathing, Feeding, Dressing Bathing Assistance: Limited assistance Feeding assistance: Independent Dressing Assistance: Limited assistance     Functional Limitations Info  Sight, Hearing, Speech Sight Info: Adequate Hearing Info: Adequate Speech Info: Adequate    SPECIAL CARE FACTORS FREQUENCY  PT (By licensed PT), OT (By licensed OT), Speech therapy     PT Frequency: 5 OT Frequency: 5      Speech Therapy Frequency: 5      Contractures Contractures Info: Not present    Additional Factors Info  Code Status, Psychotropic, Allergies Code Status Info: DNR Allergies Info: Codeine, Sulfa Antibiotics Psychotropic Info: Medications         Current Medications (12/15/2015):  This is the current hospital active medication list Current Facility-Administered Medications  Medication Dose Route Frequency Provider Last Rate Last Dose  . 0.9 %  sodium chloride infusion   Intravenous Continuous Freddrick March, MD   Stopped at 12/15/15 0701  . acetaminophen (TYLENOL) tablet 650 mg  650 mg Oral Q6H PRN Freddrick March, MD   650 mg at 12/14/15 0957   Or  . acetaminophen (TYLENOL) suppository 650 mg  650 mg Rectal Q6H PRN Freddrick March, MD      . aspirin EC tablet 81 mg  81 mg Oral Daily Marquette Saa, MD   81 mg at 12/15/15 1045  . enoxaparin (LOVENOX) injection 40 mg  40 mg Subcutaneous QHS Freddrick March, MD   40 mg at 12/14/15 2145  . escitalopram (LEXAPRO) tablet 10 mg  10 mg Oral Daily Freddrick March, MD   10 mg at 12/15/15 1045  . feeding supplement (ENSURE ENLIVE) (ENSURE ENLIVE) liquid 237 mL  237 mL Oral TID BM Uvaldo Rising, MD   237 mL at 12/15/15 1000  . multivitamin with minerals tablet 1 tablet  1 tablet Oral Daily Ronnell Freshwater  Randolm IdolFletke, MD   1 tablet at 12/15/15 1045  . oxyCODONE-acetaminophen (PERCOCET/ROXICET) 5-325 MG per tablet 1 tablet  1 tablet Oral Q6H PRN Uvaldo RisingKyle J Fletke, MD   1 tablet at 12/15/15 0716  . senna-docusate (Senokot-S) tablet 1 tablet  1 tablet Oral BID Lovena NeighboursAbdoulaye Diallo, MD   1 tablet at 12/15/15 1045     Discharge Medications: Please see discharge summary for a list of discharge medications.  Relevant Imaging Results:  Relevant Lab Results:   Additional Information SSN:  [id number]  Dede QuerySarah Cesar Rogerson, LCSW

## 2015-12-15 NOTE — Progress Notes (Signed)
Family Medicine Teaching Service Daily Progress Note Intern Pager: [phone]  Patient name: Neil Lambert Medical record number: [id number] Date of birth: 1945-11-21 Age: 70 y.o. Gender: male  Primary Care Provider: Garlan Fillers, MD Consultants: Neurology Code Status: DNR  Pt Overview and Major Events to Date:  Admit 10/3  Assessment and Plan: Neil Lambert is a 70 y.o. male presenting with increasing weakness. PMH is significant for depression, Hepatitis C, hyperlipidemia and stroke.    Weakness Worsening over past 3-4 months, with reported multiple falls recently. Reports able to walking independently as well as drive/other ADLs until onset of his current problem.  Has known residual R-sided weakness from prior CVA 30 years ago. With normal cranial nerve findings on physical exam.  2/5 weakness in right arm and right leg compared to left.  Decreased sensation over right LE. CT head with old infarcts but no acute intracranial abnormalities noted. Placed in obs. Neuro consulted, appreciate recs -- recommended labs (folate, RPR, lipid panel, vit b12) and MRI brain to r/o recurrent left cerebral infarct.  Consider stroke workup if acute stroke suspected on MRI. Patient getting MRI this AM.  -MRI brain negative, c-spine MRI ordered per neurology recs -c-spine MRI showing mmultilevel spondylosis predominantly results in foraminal narrowing. 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 -Neuro checks Q2 x 12hours -PT eval & treat for gait evaluation and recommendations--> rec SNF -OT eval and treat --> rec SNF and speech consult -Speech consult ordered -Social work to see for possible placement given hx of falls at home/lives alone -Tylenol Q6 prn for mild pain -Vitals per unit routine -once stable for discharge, consider sending with aspirin and statin medication given prior history of CVA and hyperlipidemia.  Acute fracture of fourth proximal phalanx on right foot -No  dislocation on XR -Ortho --> no surgery required -Follow with Dr. Roda Shutters for post-op shoe for support as it heals  Lucency found on lateral left femoral head -patient complains he has been in pain for some time on that side, not a new finding -on discharge from hospital, should obtain MRI L hip and follow up with Ortho  Anxiety -at home on Valium 5mg  bid -restart Valium while here on lower dose (5 mg po daily) -CIWA protocol in case of withdrawal  Urinary frequency -Patient reports frequency/urgency of urination and intermittent burning with urination -UA ordered--> Negative -continue to monitor  Chronic low back pain -at home on percoset/roxicet 5-325 and valium 5 mg -Tylenol 650 mg tab -continue to monitor -consider adding back home pain medications if needed  Depression -Cont home Lexapro -stable at current time   FEN/GI: Heart healthy, IVF @75cc /hr Prophylaxis: Lovenox  Disposition: Admit to med-surg for evaluation of weakness  Subjective:  Patient anxious after coming back from MRI this am.  No complaints at current time.  Agrees he would benefit from SNF placement and would like to know more information about which SNF facility he would go to.  Objective: Temp:  [97.7 F (36.5 C)-98.5 F (36.9 C)] 98.5 F (36.9 C) (10/05 1033) Pulse Rate:  [57-65] 65 (10/05 1033) Resp:  [17-18] 17 (10/05 1033) BP: (105-133)/(60-85) 122/62 (10/05 1033) SpO2:  [95 %-99 %] 95 % (10/05 1033)   Physical Exam: General: sitting upright in hospital bed in no acute distress Eyes: PERRLA, EOMI ENTM: MMM, tonsillar exudates Neck: supple, no LAD Cardiovascular: RRR, no m/r/g Respiratory: CTA B/L, no wheezing noted Gastrointestinal: soft, NT, ND, no masses or organomegaly, +bs MSK: limited movement of right  UE and right LE, right hand contracted into a fist (states has always been this way) Derm: no rashes or suspicious lesions, multiple scabs over bilateral knees secondary to recent  falls Neuro: AOx3, able to follow commands, cranial nerves grossly intact, some difficulty expressing certain words, strength 5/5 on left UE/LE with good tone, strength 2/5 on right side LE/UE with slightly increased tone Psych: mood appropriate  Laboratory:  Recent Labs Lab 12/13/15 1541 12/13/15 1556 12/15/15 0219  WBC 7.9  --  5.6  HGB 15.0 16.7 12.3*  HCT 45.6 49.0 38.8*  PLT 134*  --  124*    Recent Labs Lab 12/13/15 1541 12/13/15 1556 12/15/15 0219  NA 138 138 135  K 3.9 3.8 4.0  CL 107 106 105  CO2 19*  --  27  BUN 6 6 8   CREATININE 0.69 0.70 0.69  CALCIUM 8.9  --  8.6*  PROT 7.9  --   --   BILITOT 3.2*  --   --   ALKPHOS 120  --   --   ALT 31  --   --   AST 67*  --   --   GLUCOSE 85 82 102*   Imaging/Diagnostic Tests:  Mr Cervical Spine Wo Contrast  Result Date: 12/15/2015 CLINICAL DATA:  Frequent falls and marked weakness, particularly in the right lower extremity. EXAM: MRI CERVICAL SPINE WITHOUT CONTRAST TECHNIQUE: Multiplanar, multisequence MR imaging of the cervical spine was performed. No intravenous contrast was administered. COMPARISON:  None. FINDINGS: Alignment: There is mild reversal lordosis. Vertebrae: Height is maintained. Scattered hemangiomas are seen. No worrisome marrow lesion. Cord: Normal signal throughout. Posterior Fossa, vertebral arteries, paraspinal tissues: Unremarkable. Disc levels: C2-3:  Negative. C3-4: Disc osteophyte complex and bilateral uncovertebral disease are seen. There is flattening of the ventral cord and severe bilateral foraminal narrowing. C4-5: Shallow disc osteophyte complex and bilateral uncovertebral disease. There is mild flattening of the ventral cord. Moderately severe to severe foraminal narrowing appears worse on the left. C5-6: Shallow disc bulge with endplate spurring and uncovertebral disease. The ventral thecal sac is nearly effaced. Moderately severe to severe foraminal narrowing is worse on the left. C6-7:  Shallow disc bulge and uncovertebral disease are seen. The ventral thecal sac is narrowed but not effaced. Moderately severe to severe foraminal narrowing is worse on the right. C7-T1:  Negative. IMPRESSION: Normal appearing cervical cord. No finding to explain the patient's symptoms. 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 Please see above for descriptions of individual levels. Electronically Signed   By: Drusilla Kannerhomas  Dalessio M.D.   On: 12/15/2015 09:00   Freddrick MarchYashika Leylanie Woodmansee, MD 12/15/2015, 1:03 PM PGY-1, [facility] FPTS Intern pager: [phone], text pages welcome

## 2015-12-15 NOTE — Clinical Social Work Note (Signed)
Clinical Social Work Assessment  Patient Details  Name: Neil Lambert MRN: [id number] Date of Birth: 1945-07-25  Date of referral:  12/15/15               Reason for consult:  Facility Placement                Permission sought to share information with:  Family Supports Permission granted to share information::  Yes, Verbal Permission Granted  Name::     Cathlean Cower  Relationship::  Significant Other  Contact Information:  [phone]  Housing/Transportation Living arrangements for the past 2 months:  [place] of Information:  Patient Patient Interpreter Needed:  None Criminal Activity/Legal Involvement Pertinent to Current Situation/Hospitalization:  No - Comment as needed Significant Relationships:  Significant Other Lives with:  Self Do you feel safe going back to the place where you live?  Yes Need for family participation in patient care:  No (Coment)  Care giving concerns:  No care giving concerns identified.   Social Worker assessment / plan:  CSW met with pt and girlfriend to address consult for New SNF. CSW introduced herself and explained role of social work. CSW also explained process of discharging to SNF as recommended by PT. CSW explained the process of discharging to SNF. CSW initiated SNF search and will follow up with bed offers. CSW answered questions regarding placement. CSW will continue to follow.   Employment status:  Retired Nurse, adult PT Recommendations:  [place] / Referral to community resources:  [facility]  Patient/Family's Response to care:  Pt and girlfriend were appreciative of CSW support.   Patient/Family's Understanding of and Emotional Response to Diagnosis, Current Treatment, and Prognosis:  Pt understands that he would benefit from STR at SNF prior to returning home.   Emotional Assessment Appearance:  Appears stated age Attitude/Demeanor/Rapport:   Other (Appriopriate) Affect (typically observed):  Accepting, Adaptable, Pleasant Orientation:  Oriented to Self, Oriented to Place, Oriented to  Time, Oriented to Situation Alcohol / Substance use:  Never Used Psych involvement (Current and /or in the community):  No (Comment)  Discharge Needs  Concerns to be addressed:  Adjustment to Illness Readmission within the last 30 days:  No Current discharge risk:  Chronically ill Barriers to Discharge:  Continued Medical Work up   Darden Dates, LCSW 12/15/2015, 4:38 PM

## 2015-12-16 DIAGNOSIS — R2681 Unsteadiness on feet: Secondary | ICD-10-CM

## 2015-12-16 LAB — BASIC METABOLIC PANEL WITH GFR
Anion gap: 5 (ref 5–15)
BUN: 8 mg/dL (ref 6–20)
CO2: 26 mmol/L (ref 22–32)
Calcium: 8.6 mg/dL — ABNORMAL LOW (ref 8.9–10.3)
Chloride: 107 mmol/L (ref 101–111)
Creatinine, Ser: 0.7 mg/dL (ref 0.61–1.24)
GFR calc Af Amer: >60 mL/min
GFR calc non Af Amer: >60 mL/min
Glucose, Bld: 91 mg/dL (ref 65–99)
Potassium: 4.3 mmol/L (ref 3.5–5.1)
Sodium: 138 mmol/L (ref 135–145)

## 2015-12-16 MED ORDER — DIAZEPAM 5 MG PO TABS: 5.0000 mg | ORAL_TABLET | Freq: Every day | ORAL | 0 refills | Status: DC

## 2015-12-16 MED ORDER — VITAMIN B-1 100 MG PO TABS: 100.0000 mg | ORAL_TABLET | Freq: Every day | ORAL | Status: DC

## 2015-12-16 MED ORDER — OXYCODONE-ACETAMINOPHEN 5-325 MG PO TABS: 1.0000 | ORAL_TABLET | Freq: Four times a day (QID) | ORAL | 0 refills | Status: DC | PRN

## 2015-12-16 MED ORDER — ASPIRIN 81 MG PO TBEC: 81.0000 mg | DELAYED_RELEASE_TABLET | Freq: Every day | ORAL | 3 refills | Status: DC

## 2015-12-16 NOTE — Progress Notes (Signed)
Occupational Therapy Treatment Patient Details Name: Neil Lambert MRN: [id number] DOB: 08-09-1945 Today's Date: 12/16/2015    History of present illness 70 y.o. male presenting with increasing weakness and inability to walk over the past month.  Has been crawling in his home to get around and is afraid he will fall.  States he has residual weakness in right leg and right arm that is baseline from previous stroke 30 years ago. MRI on 10/4 negative for acute infarct. 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   OT comments  Focus of today's session on bil LE/UE weight bearing and performing sit to stand from chair with max assist +2 with bil knees blocked. Increase in R hand edema noted; educated pt and his girlfriend on edema management strategies-elevation, retrograde massage, self/PROM. D/c plan remains appropriate. Will continue to follow acutely.   Follow Up Recommendations  SNF;Supervision/Assistance - 24 hour    Equipment Recommendations  Other (comment) (TBD at next venue)    Recommendations for Other Services      Precautions / Restrictions Precautions Precautions: Fall Restrictions Weight Bearing Restrictions: No       Mobility Bed Mobility               General bed mobility comments: OOB in chair upon arrival  Transfers Overall transfer level: Needs assistance Equipment used: Rolling walker (2 wheeled);2 person hand held assist Transfers: Sit to/from Stand Sit to Stand: +2 physical assistance;Max assist         General transfer comment: assist to power up into standing with hand over hand cues for R hand placement; vc for technique and multimodal cues for posture upon standing with max A +2 to maintain balance for static standing; sit to stands X2; first trial no RW and HHA +2; second trial attempted using RW to maintain stand however did not decrease need for assistance; bilat knees blockes and R UE supported by therapist    Balance  Overall balance assessment: Needs assistance Sitting-balance support: Feet supported Sitting balance-Leahy Scale: Fair     Standing balance support: Bilateral upper extremity supported Standing balance-Leahy Scale: Poor                     ADL Overall ADL's : Needs assistance/impaired                                       General ADL Comments: Pt noted to have increased edema in RUE; educated pt and his girlfriend on edema management strategies-elevation, retrograde massage, self/passive ROM.      Vision                     Perception     Praxis      Cognition   Behavior During Therapy: Anxious Overall Cognitive Status: Impaired/Different from baseline Area of Impairment: Attention;Memory;Safety/judgement;Problem solving;Awareness   Current Attention Level: Sustained Memory: Decreased short-term memory    Safety/Judgement: Decreased awareness of safety;Decreased awareness of deficits Awareness: Emergent Problem Solving: Decreased initiation;Requires verbal cues;Requires tactile cues General Comments: pt continues to be fearful of falling    Extremity/Trunk Assessment               Exercises General Exercises - Lower Extremity Long Arc Quad: AROM;Right;5 reps;Seated Hip Flexion/Marching: AROM;Right;5 reps;Seated   Shoulder Instructions       General Comments  Pertinent Vitals/ Pain       Pain Assessment: Faces Faces Pain Scale: Hurts little more Pain Location: R hip Pain Descriptors / Indicators: Sore Pain Intervention(s): Limited activity within patient's tolerance;Monitored during session  Home Living     Available Help at Discharge: Friend(s);Available PRN/intermittently Type of Home: House                              Lives With: Alone    Prior Functioning/Environment              Frequency  Min 2X/week        Progress Toward Goals  OT Goals(current goals can now be found in the  care plan section)  Progress towards OT goals: Progressing toward goals  Acute Rehab OT Goals Patient Stated Goal: progress mobility OT Goal Formulation: With patient  Plan Discharge plan remains appropriate    Co-evaluation    PT/OT/SLP Co-Evaluation/Treatment: Yes Reason for Co-Treatment: For patient/therapist safety PT goals addressed during session: Mobility/safety with mobility;Strengthening/ROM OT goals addressed during session: Strengthening/ROM      End of Session Equipment Utilized During Treatment: Rolling walker;Gait belt   Activity Tolerance Patient tolerated treatment well   Patient Left in chair;with call bell/phone within reach;with family/visitor present   Nurse Communication Mobility status        Time: 1445-1511 OT Time Calculation (min): 26 min  Charges: OT General Charges $OT Visit: 1 Procedure OT Treatments $Therapeutic Activity: 8-22 mins  Gaye Alken M.S., OTR/L Pager: [phone]  12/16/2015, 5:38 PM

## 2015-12-16 NOTE — Progress Notes (Signed)
Family Medicine Teaching Service Daily Progress Note Intern Pager: [phone]  Patient name: Neil Lambert Medical record number: [id number] Date of birth: 1945-05-30 Age: 70 y.o. Gender: male  Primary Care Provider: Garlan FillersPATERSON,DANIEL G, MD Consultants: Neurology Code Status: DNR  Pt Overview and Major Events to Date:  Admit 10/3  Assessment and Plan: Neil Lambert is a 70 y.o. male presenting with increasing weakness. PMH is significant for depression, Hepatitis C, hyperlipidemia and stroke.    Weakness Worsening over past 3-4 months, with reported multiple falls recently. Reports able to walking independently as well as drive/other ADLs until onset of his current problem.  Has known residual R-sided weakness from prior CVA 30 years ago. With normal cranial nerve findings on physical exam.  2/5 weakness in right arm and right leg compared to left.  CT head with old infarcts but no acute intracranial abnormalities noted. Neuro consulted, appreciate recs. Folate, B12, TSH, RPR all within normal limits. Obtained head MRI brain to r/o recurrent left cerebral infarct as well as c-spine MRI per neuro recs. PT recommended SNF placement. OT recommended SLP consult.  -MRI brain negative, c-spine MRI showing multilevel spondylosis predominantly results in foraminal narrowing. 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 Consult neuro, appreciate further recs on this as MRI head and c-spine findings cannot explain symptoms, may suspect chronic deconditioning.  -Neuro signed off, recommend physical therapy for patient's foot drop -Speech consult pending -Social work for SNF given hx of falls at home/lives alone -once stable for discharge, send with aspirin and statin medication given prior history of CVA and hyperlipidemia  Acute fracture of fourth proximal phalanx on right foot -No dislocation on XR -Ortho --> no surgery required -Follow with Dr. Roda ShuttersXu for post-op shoe for support as it  heals  Lucency found on lateral left femoral head -patient complains he has been in pain for some time on that side, not a new finding -on discharge from hospital, should obtain MRI L hip and follow up with Ortho  Anxiety, stable. Patient reports feeling better now that Valium restarted. -at home on Valium 5mg  bid -restart Valium while here on lower dose (5 mg po daily) -CIWA protocol in case of withdrawal. CIWA scores have been stable at 1 overnight  Chronic low back pain -at home on percoset/roxicet 5-325 and valium 5 mg -Tylenol 650 mg tab -continue to monitor -consider adding back home pain medications if needed  Depression -Cont home Lexapro -stable at current time  FEN/GI: Heart healthy, IVF @75cc /hr Prophylaxis: Lovenox  Disposition: SNF placement pending SLP consult  Subjective:  Patient anxious about leaving before the weekend.  Explained to him SW working on SNF placement.  No complaints at current time.   Objective: Temp:  [97.8 F (36.6 C)-98.6 F (37 C)] 98.1 F (36.7 C) (10/06 0510) Pulse Rate:  [62-70] 65 (10/06 0510) Resp:  [16-18] 18 (10/06 0510) BP: (115-130)/(57-73) 130/73 (10/06 0510) SpO2:  [95 %-98 %] 95 % (10/06 0510)   Physical Exam: General: sitting upright in hospital bed in no acute distress Eyes: PERRLA, EOMI ENTM: MMM, tonsillar exudates Neck: supple, no LAD Cardiovascular: RRR, no m/r/g Respiratory: CTA B/L, no wheezing noted Gastrointestinal: soft, NT, ND, no masses or organomegaly, +bs MSK: limited movement of right UE and right LE Derm: scabs over bilateral knees secondary to falls Neuro: AOx3, able to follow commands, CN grossly intact, strength 5/5 on left UE/LE with good tone, strength 2/5 on right side LE/UE Psych: mood appropriate  Laboratory:  Recent  Labs Lab 12/13/15 1541 12/13/15 1556 12/15/15 0219  WBC 7.9  --  5.6  HGB 15.0 16.7 12.3*  HCT 45.6 49.0 38.8*  PLT 134*  --  124*    Recent Labs Lab 12/13/15 1541  12/13/15 1556 12/15/15 0219 12/16/15 0549  NA 138 138 135 138  K 3.9 3.8 4.0 4.3  CL 107 106 105 107  CO2 19*  --  27 26  BUN 6 6 8 8   CREATININE 0.69 0.70 0.69 0.70  CALCIUM 8.9  --  8.6* 8.6*  PROT 7.9  --   --   --   BILITOT 3.2*  --   --   --   ALKPHOS 120  --   --   --   ALT 31  --   --   --   AST 67*  --   --   --   GLUCOSE 85 82 102* 91   Imaging/Diagnostic Tests:  No results found. Freddrick March, MD 12/16/2015, 12:28 PM PGY-1, [facility] Family Medicine FPTS Intern pager: [phone], text pages welcome

## 2015-12-16 NOTE — Clinical Social Work Note (Signed)
Pt is ready for discharge today. CSW presented bed offers. Pt chose [facility]. Pt's girlfriend is aware and agreeable as well. Facility is able to accept pt today and they have received discharge information. RN will call report. [facility] will provide transportation. CSW is signing off as no further needs identified.   Dede QuerySarah Daryn Hicks, MSW, LCSW  Clinical Social Worker  [phone]

## 2015-12-16 NOTE — Discharge Summary (Signed)
Family Medicine Teaching [facility] Discharge Summary  Patient name: Neil Lambert C Fallert Medical record number: [id number] Date of birth: 1945/04/19 Age: 70 y.o. Gender: male Date of Admission: 12/13/2015  Date of Discharge: 12/16/2015 Admitting Physician: Uvaldo RisingKyle J Fletke, MD  Primary Care Provider: Garlan FillersPATERSON,DANIEL G, MD Consultants: Neuro  Indication for Hospitalization: weakness  Discharge Diagnoses/Problem List:  Patient Active Problem List   Diagnosis Date Noted  . Weakness of extremity   . Gait instability   . Elevated AST (SGOT) 12/14/2015  . Depression 12/14/2015  . History of CVA (cerebrovascular accident) 12/14/2015  . Right hemiplegia (HCC)   . Right hip pain   . Nondisplaced fracture of proximal phalanx of right lesser toe(s), initial encounter for closed fracture   . Generalized weakness 12/13/2015  . Hemiparesis (HCC) 12/15/2013  . Hepatitis C, chronic (HCC) 12/06/2010   Disposition: SNF  Discharge Condition: Stable  Discharge Exam:  General: sitting upright in hospital bed in no acute distress Eyes: PERRLA, EOMI ENTM: MMM, tonsillar exudates Neck: supple, no LAD Cardiovascular: RRR, no m/r/g Respiratory: CTA B/L, no wheezing noted Gastrointestinal: soft, NT, ND, no masses or organomegaly, +bs MSK: limited movement of right UE and right LE Derm: scabs over bilateral knees secondary to falls Neuro: AOx3, able to follow commands, CN grossly intact, strength 5/5 on left UE/LE with good tone, strength 2/5 on right side LE/UE Psych: mood appropriate  Brief Hospital Course:   Weakness Patient with worsening weakness over past 3-4 months, with reported multiple falls recently. Has known residual R-sided weakness from prior CVA 30 years ago. On admission, with normal cranial nerve findings on physical exam and 2/5 weakness in right arm and right leg compared to left.  CT head with old infarcts but no acute intracranial abnormalities noted. Neuro consulted. Folate,  B12, TSH, RPR all within normal limits. Obtained head MRI brain to rule out recurrent left cerebral infarct as well as c-spine MRI per neuro recs.  C-spine MRI showing multilevel spondylosis predominantly results in foraminal narrowing. 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 As findings do not reflect symptoms, suspect chronic deconditioning.  Neurology recommends PT upon discharge .   Acute fracture of fourth proximal phalanx on right foot Found on foot XR. Ortho seen and determined no surgery would be required at this time.  Patient will follow with Dr. Roda ShuttersXu for post-op shoe for support as it heals  Lucency found on lateral left femoral head Patient complains he has been in pain for some time on that side, not a new finding. Upon discharge from hospital, should obtain MRI L hip and follow up with Ortho  Issues for Follow Up:  1. Left hip MRI to be performed 2. Started on aspirin 3. Physical therapy for foot drop (recommended by neurology) 4. Post op shoe. Follow up with Dr. Roda ShuttersXu in 1-2 weeks.  Significant Procedures: None  Significant Labs and Imaging:   Recent Labs Lab 12/13/15 1541 12/13/15 1556 12/15/15 0219  WBC 7.9  --  5.6  HGB 15.0 16.7 12.3*  HCT 45.6 49.0 38.8*  PLT 134*  --  124*    Recent Labs Lab 12/13/15 1541 12/13/15 1556 12/15/15 0219 12/16/15 0549  NA 138 138 135 138  K 3.9 3.8 4.0 4.3  CL 107 106 105 107  CO2 19*  --  27 26  GLUCOSE 85 82 102* 91  BUN 6 6 8 8   CREATININE 0.69 0.70 0.69 0.70  CALCIUM 8.9  --  8.6* 8.6*  ALKPHOS 120  --   --   --  AST 67*  --   --   --   ALT 31  --   --   --   ALBUMIN 3.2*  --   --   --    Mr Cervical Spine Wo Contrast  Result Date: 12/15/2015 CLINICAL DATA:  Frequent falls and marked weakness, particularly in the right lower extremity. EXAM: MRI CERVICAL SPINE WITHOUT CONTRAST TECHNIQUE: Multiplanar, multisequence MR imaging of the cervical spine was performed. No intravenous contrast was administered.  COMPARISON:  None. FINDINGS: Alignment: There is mild reversal lordosis. Vertebrae: Height is maintained. Scattered hemangiomas are seen. No worrisome marrow lesion. Cord: Normal signal throughout. Posterior Fossa, vertebral arteries, paraspinal tissues: Unremarkable. Disc levels: C2-3:  Negative. C3-4: Disc osteophyte complex and bilateral uncovertebral disease are seen. There is flattening of the ventral cord and severe bilateral foraminal narrowing. C4-5: Shallow disc osteophyte complex and bilateral uncovertebral disease. There is mild flattening of the ventral cord. Moderately severe to severe foraminal narrowing appears worse on the left. C5-6: Shallow disc bulge with endplate spurring and uncovertebral disease. The ventral thecal sac is nearly effaced. Moderately severe to severe foraminal narrowing is worse on the left. C6-7: Shallow disc bulge and uncovertebral disease are seen. The ventral thecal sac is narrowed but not effaced. Moderately severe to severe foraminal narrowing is worse on the right. C7-T1:  Negative. IMPRESSION: Normal appearing cervical cord. No finding to explain the patient's symptoms. 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 Please see above for descriptions of individual levels. Electronically Signed   By: Drusilla Kanner M.D.   On: 12/15/2015 09:00   Results/Tests Pending at Time of Discharge: None  Discharge Medications:    Medication List    TAKE these medications   aspirin 81 MG EC tablet Take 1 tablet (81 mg total) by mouth daily. Start taking on:  12/17/2015   aspirin-acetaminophen-caffeine 250-250-65 MG tablet Commonly known as:  EXCEDRIN MIGRAINE Take 2 tablets by mouth every 6 (six) hours as needed for headache.   diazepam 5 MG tablet Commonly known as:  VALIUM Take 1 tablet (5 mg total) by mouth daily. What changed:  when to take this  reasons to take this    escitalopram 5 MG tablet Commonly known as:  LEXAPRO Take 10 mg by mouth daily.   oxyCODONE-acetaminophen 5-325 MG tablet Commonly known as:  PERCOCET/ROXICET Take 1 tablet by mouth every 6 (six) hours as needed (hip/low back pain). What changed:  reasons to take this   PROBIOTIC DAILY Caps Take 1 capsule by mouth daily.      Discharge Instructions: Please refer to Patient Instructions section of EMR for full details.  Patient was counseled important signs and symptoms that should prompt return to medical care, changes in medications, dietary instructions, activity restrictions, and follow up appointments.   Follow-Up Appointments: Please follow up with your PCP within 1 week.   Freddrick March, MD 12/16/2015, 1:05 PM PGY-1, [facility]

## 2015-12-16 NOTE — Care Management Important Message (Signed)
Important Message  Patient Details  Name: Ilona Sorrelrthur C Bergevin MRN: [id number] Date of Birth: [birth year]   Medicare Important Message Given:  Yes    Dorena BodoIris Rodolfo Notaro 12/16/2015, 11:49 AM

## 2015-12-16 NOTE — Care Management Note (Signed)
Case Management Note  Patient Details  Name: Neil Lambert MRN: [id number] Date of Birth: December 13, 1945  Subjective/Objective:                    Action/Plan: Recommendations are for SNF at discharge. CM following for further d/c needs.   Expected Discharge Date:                  Expected Discharge Plan:  Skilled Nursing Facility  In-House Referral:  Clinical Social Work  Discharge planning Services  CM Consult  Post Acute Care Choice:    Choice offered to:     DME Arranged:    DME Agency:     HH Arranged:    HH Agency:     Status of Service:  In process, will continue to follow  If discussed at Long Length of Stay Meetings, dates discussed:    Additional Comments:  Kermit BaloKelli F Toree Edling, RN 12/16/2015, 10:48 AM

## 2015-12-16 NOTE — Progress Notes (Signed)
Orthopedic Tech Progress Note Patient Details:  Neil Lambert [birth year] [id number]  Ortho Devices Type of Ortho Device: Postop shoe/boot Ortho Device/Splint Location: rle Ortho Device/Splint Interventions: Application   [name] 12/16/2015, 10:51 AM

## 2015-12-16 NOTE — Progress Notes (Signed)
Physical Therapy Treatment Patient Details Name: Neil Lambert MRN: [id number] DOB: 16-Sep-1945 Today's Date: 12/16/2015    History of Present Illness 70 y.o. male presenting with increasing weakness and inability to walk over the past month.  Has been crawling in his home to get around and is afraid he will fall.  States he has residual weakness in right leg and right arm that is baseline from previous stroke 30 years ago. MRI on 10/4 negative for acute infarct. 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    PT Comments    Patient required +2 assist for sit to stand transfers X2. Pt continues to demo decreased strength/mobility and fear of falling and will benefit from further skilled PT services. Current plan remains appropriate.   Follow Up Recommendations  SNF     Equipment Recommendations  None recommended by PT (TBA)    Recommendations for Other Services       Precautions / Restrictions Precautions Precautions: Fall Restrictions Weight Bearing Restrictions: No    Mobility  Bed Mobility               General bed mobility comments: OOB in chair upon arrival  Transfers Overall transfer level: Needs assistance Equipment used: Rolling walker (2 wheeled);2 person hand held assist Transfers: Sit to/from Stand Sit to Stand: +2 physical assistance;Max assist         General transfer comment: assist to power up into standing with hand over hand cues for R hand placement; vc for technique and multimodal cues for posture upon standing with max A +2 to maintain balance for static standing; sit to stands X2; first trial no RW and HHA +2; second trial attempted using RW to maintain stand however did not decrease need for assistance; bilat knees blockes and R UE supported by therapist  Ambulation/Gait                 Stairs            Wheelchair Mobility    Modified Rankin (Stroke Patients Only)       Balance     Sitting  balance-Leahy Scale: Fair       Standing balance-Leahy Scale: Poor                      Cognition Arousal/Alertness: Awake/alert Behavior During Therapy: Anxious Overall Cognitive Status: Impaired/Different from baseline Area of Impairment: Attention;Memory;Safety/judgement;Problem solving;Awareness   Current Attention Level: Sustained Memory: Decreased short-term memory   Safety/Judgement: Decreased awareness of safety;Decreased awareness of deficits Awareness: Emergent Problem Solving: Decreased initiation;Requires verbal cues;Requires tactile cues General Comments: pt continues to be fearful of falling    Exercises General Exercises - Lower Extremity Long Arc Quad: AROM;Right;5 reps;Seated Hip Flexion/Marching: AROM;Right;5 reps;Seated    General Comments General comments (skin integrity, edema, etc.): g/f present for session; post op shoe donned      Pertinent Vitals/Pain Pain Assessment: Faces Faces Pain Scale: Hurts little more Pain Location: R hip with standing Pain Descriptors / Indicators: Sore Pain Intervention(s): Limited activity within patient's tolerance;Monitored during session;Repositioned;Premedicated before session    Home Living                      Prior Function            PT Goals (current goals can now be found in the care plan section) Acute Rehab PT Goals Patient Stated Goal: progress mobility PT Goal Formulation: With  patient Time For Goal Achievement: 12/21/15 Potential to Achieve Goals: Good Progress towards PT goals: Progressing toward goals    Frequency    Min 3X/week      PT Plan Current plan remains appropriate    Co-evaluation PT/OT/SLP Co-Evaluation/Treatment: Yes Reason for Co-Treatment: For patient/therapist safety PT goals addressed during session: Mobility/safety with mobility;Strengthening/ROM       End of Session Equipment Utilized During Treatment: Gait belt Activity Tolerance: Patient  tolerated treatment well;Other (comment) (limited by fear of falling/weakness) Patient left: in chair;with call bell/phone within reach;with family/visitor present     Time: 1445-1511 PT Time Calculation (min) (ACUTE ONLY): 26 min  Charges:  $Therapeutic Activity: 8-22 mins                    G Codes:      Derek MoundKellyn R Deaglan Lile Cabrini Ruggieri, PTA Pager: [phone]   12/16/2015, 3:50 PM

## 2015-12-16 NOTE — Clinical Social Work Placement (Signed)
   CLINICAL SOCIAL WORK PLACEMENT  NOTE  Date:  12/16/2015  Patient Details  Name: Neil Lambert C Luczynski MRN: [id number] Date of Birth: 1946-03-10  Clinical Social Work is seeking post-discharge placement for this patient at the Skilled  Nursing Facility level of care (*CSW will initial, date and re-position this form in  chart as items are completed):  Yes   Patient/family provided with [facility] Social Work Department's list of facilities offering this level of care within the geographic area requested by the patient (or if unable, by the patient's family).  Yes   Patient/family informed of their freedom to choose among providers that offer the needed level of care, that participate in Medicare, Medicaid or managed care program needed by the patient, have an available bed and are willing to accept the patient.  Yes   Patient/family informed of [facility]'s ownership interest in [facility] and [facility], as well as of the fact that they are under no obligation to receive care at these facilities.  PASRR submitted to EDS on 12/15/15     PASRR number received on 12/15/15     Existing PASRR number confirmed on       FL2 transmitted to all facilities in geographic area requested by pt/family on 12/15/15     FL2 transmitted to all facilities within larger geographic area on       Patient informed that his/her managed care company has contracts with or will negotiate with certain facilities, including the following:        Yes   Patient/family informed of bed offers received.  [facility]     Physician recommends and patient chooses bed at      [facility] on 12/16/15.  Patient to be transferred to facility by [facility]     Patient family notified on 12/16/15 of transfer.  Name of family member notified:  pt's girlfrind, Larita FifeLynn     PHYSICIAN       Additional Comment:     _______________________________________________ Dede QuerySarah Laiyah Exline, LCSW 12/16/2015, 4:33 PM

## 2015-12-16 NOTE — Progress Notes (Signed)
PT Cancellation Note  Patient Details Name: Neil Lambert MRN: [name] DOB: 16-Feb-1946   Cancelled Treatment:    Reason Eval/Treat Not Completed: Other (comment) (waiting for post-op shoe arrival) PT will check on pt later as time allows.    Derek MoundKellyn R Atwood Adcock Auna Mikkelsen, PTA Pager: [phone]   12/16/2015, 10:23 AM

## 2015-12-16 NOTE — Evaluation (Signed)
Speech Language Pathology Evaluation Patient Details Name: Neil Lambert MRN: [id number] DOB: 31-Jan-1946 Today's Date: 12/16/2015 Time: [id number] SLP Time Calculation (min) (ACUTE ONLY): 20 min  Problem List:  Patient Active Problem List   Diagnosis Date Noted  . Weakness of extremity   . Gait instability   . Elevated AST (SGOT) 12/14/2015  . Depression 12/14/2015  . History of CVA (cerebrovascular accident) 12/14/2015  . Right hemiplegia (HCC)   . Right hip pain   . Nondisplaced fracture of proximal phalanx of right lesser toe(s), initial encounter for closed fracture   . Generalized weakness 12/13/2015  . Hemiparesis (HCC) 12/15/2013  . Hepatitis C, chronic (HCC) 12/06/2010   Past Medical History:  Past Medical History:  Diagnosis Date  . Depression   . HCV (hepatitis C virus)   . Headache   . Hyperlipidemia   . Stroke [facility](HCC)    Past Surgical History:  Past Surgical History:  Procedure Laterality Date  . CHOLECYSTECTOMY    . FOOT SURGERY     with metal brace in but  not any more   worn  brace on rt foot for 6 yrs   HPI:  70 y.o. male presenting with increasing weakness and inability to walk over the past month.  Has been crawling in his home to get around and is afraid he will fall.  States he has residual weakness in right leg and right arm that is baseline from previous stroke 30 years ago. MRI on 10/4 negative for acute infarct. 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   Assessment / Plan / Recommendation Clinical Impression  Pt scored in the moderate-severity range on memory, problem solving and calcuation subtest on the Cognistat (moderate-severe). Pt's speech noticeably dysfluent with decreased intelligibility and stated he feels that it is due to pain meds recently received. He required max assist with simple calculations. Pt would benefit from ST to increase cognitive independence.       SLP Assessment  Patient needs continued Speech  [place] Pathology Services    Follow Up Recommendations  Skilled Nursing facility    Frequency and Duration min 2x/week  2 weeks      SLP Evaluation Cognition  Overall Cognitive Status: Impaired/Different from baseline Arousal/Alertness: Awake/alert Orientation Level: Oriented X4 Attention: Sustained Sustained Attention: Appears intact Memory: Impaired Memory Impairment: Retrieval deficit (moderate deficit) Awareness: Impaired Awareness Impairment: Anticipatory impairment Problem Solving: Impaired Safety/Judgment: Impaired       Comprehension  Auditory Comprehension Overall Auditory Comprehension: Appears within functional limits for tasks assessed Visual Recognition/Discrimination Discrimination: Not tested Reading Comprehension Reading Status: Not tested    Expression Expression Primary Mode of Expression: Verbal Verbal Expression Overall Verbal Expression: Appears within functional limits for tasks assessed Written Expression Dominant Hand: Right (right initially, using left for 30 years) Written Expression: Not tested   Oral / Motor  Oral Motor/Sensory Function Overall Oral Motor/Sensory Function: Within functional limits Motor Speech Overall Motor Speech: Impaired Respiration: Within functional limits Phonation: Normal Resonance: Within functional limits Articulation: Within functional limitis Intelligibility: Intelligibility reduced Word: 75-100% accurate Phrase: 75-100% accurate Sentence: 75-100% accurate Conversation: 75-100% accurate   GO                    Royce MacadamiaLitaker, Myrtha Tonkovich Willis 12/16/2015, 4:31 PM  Breck CoonsLisa Willis Joni Colegrove M.Ed [employer]SL[employer] Pager [phone]

## 2015-12-16 NOTE — Progress Notes (Signed)
Pt discharge education and instructions completed with pt and spouse at bedside; pt discharge to [facility] and report called off to nurse Alcario DroughtErica at the facility. Pt telemetry and IV removed; pt waiting on [facility] to be transported off to disposition. Will continue to closely monitor pt till he's picked up by [facility]. Dionne BucyP. Amo Jeromie Gainor RN

## 2015-12-16 NOTE — Progress Notes (Signed)
OT Cancellation Note  Patient Details Name: Neil Lambert MRN: [id number] DOB: 03-26-1945   Cancelled Treatment:    Reason Eval/Treat Not Completed: Other (comment) (awaiting arrival of R post op shoe to mobilize pt). Will follow up as time allows.  Gaye AlkenBailey A Prisma Decarlo M.S., OTR/L Pager: [phone]   12/16/2015, 10:20 AM

## 2015-12-16 NOTE — Progress Notes (Signed)
Pt picked up by [facility] to be transported off to disposition. Pt transported off unit via stretcher with belongings and spouse to the side. Pt education completed. Dionne BucyP. Amo Quindon Denker RN

## 2015-12-19 ENCOUNTER — Non-Acute Institutional Stay (SKILLED_NURSING_FACILITY): Payer: Medicare Other | Admitting: Internal Medicine

## 2015-12-19 ENCOUNTER — Encounter: Payer: Self-pay | Admitting: Internal Medicine

## 2015-12-19 DIAGNOSIS — I679 Cerebrovascular disease, unspecified: Secondary | ICD-10-CM

## 2015-12-19 DIAGNOSIS — Z8673 Personal history of transient ischemic attack (TIA), and cerebral infarction without residual deficits: Secondary | ICD-10-CM

## 2015-12-19 DIAGNOSIS — M47812 Spondylosis without myelopathy or radiculopathy, cervical region: Secondary | ICD-10-CM

## 2015-12-19 DIAGNOSIS — S92514A Nondisplaced fracture of proximal phalanx of right lesser toe(s), initial encounter for closed fracture: Secondary | ICD-10-CM | POA: Diagnosis not present

## 2015-12-19 DIAGNOSIS — F4323 Adjustment disorder with mixed anxiety and depressed mood: Secondary | ICD-10-CM

## 2015-12-19 DIAGNOSIS — G8191 Hemiplegia, unspecified affecting right dominant side: Secondary | ICD-10-CM

## 2015-12-19 DIAGNOSIS — R2681 Unsteadiness on feet: Secondary | ICD-10-CM | POA: Diagnosis not present

## 2015-12-19 DIAGNOSIS — G894 Chronic pain syndrome: Secondary | ICD-10-CM

## 2015-12-19 NOTE — Progress Notes (Signed)
Patient ID: Neil Lambert, male   DOB: 1945/03/25, 70 y.o.   MRN: [id number]    HISTORY AND PHYSICAL   DATE: 12/19/2015  Location:    [facility] Room Number: 129 A Place of Service: SNF (31)   Extended Emergency Contact Information Primary Emergency Contact: OTT,CHARLES Address: [address] [place]  [address] Home Phone: [phone] Relation: None Secondary Emergency Contact: Johna Sheriff [place] of [place] Home Phone: [phone] Relation: Significant other  Advanced Directive information Does patient have an advance directive?: Yes, Type of Advance Directive: Out of facility DNR (pink MOST or yellow form), Does patient want to make changes to advanced directive?: No - Patient declined  Chief Complaint  Patient presents with  . New Admit To SNF    HPI:  70 yo male seen today as a new admission into SNF following hospital stay weakness, gait instability, elevated AST, left femoral head lucency by xray, depression, hx CVA with right hemiparesis, right 4th toe nondisplaced fx, chronic Hep C, hx frequent falls. MRI brain revealed no acute process; chronic infarcts left MCA, left ACA and right MCA territories; small 2 cm right anterior parasagittal calcified meningioma unchanged since 2008. C spine MRI showed multilevel spondylosis and central canal stenosis worse at C3-C4. Xray right foot (+) acute fx proximal 4th toe with slight impaction at fx site. B12 716; LDL 85; RPR NR; folate 14.3; TSH 2.523; Cr 0.69; Albumin 3.2; AST 67; HgB 12.3; Plts 124K. Ortho saw pt for toe fx and determined no surgical intervention req'd. He was given post-op boot for support. He presents to SNF for short term rehab.  He reports frustration and increased anxiety for what he perceives as facility concerns. No pain in right foot but does have low back pain.  Pain stable on percocet. Takes miralax for constipation. He would like better food choices as he sees himself as a  vegetarian but does eat chicken, fish and eggs. He is a poor historian due to psych d/o. Hx obtained from chart  Depression - takes lexapro and valium daily.  Chronic Hep C - untreated. He is followed by Dr Jarold Motto  Hx CVA 30 yrs ago- has right hemiparesis/hemiplegia and gait instability. He takes ASA daily  Chronic pain syndrome - has cervical spondylosis. He has a pain specialist Dr Vear Clock  Past Medical History:  Diagnosis Date  . Depression   . HCV (hepatitis C virus)   . Headache   . Hyperlipidemia   . Stroke [facility])     Past Surgical History:  Procedure Laterality Date  . CHOLECYSTECTOMY    . FOOT SURGERY     with metal brace in but  not any more   worn  brace on rt foot for 6 yrs    Patient Care Team: Jarome Matin, MD as PCP - General (Internal Medicine)  Social History   Social History  . Marital status: Single    Spouse name: N/A  . Number of children: N/A  . Years of education: N/A   Occupational History  . Not on file.   Social History Main Topics  . Smoking status: Former Smoker    Types: Cigarettes  . Smokeless tobacco: Never Used  . Alcohol use 1.2 - 1.8 oz/week    2 - 3 Cans of beer per week     Comment: * packs a week  . Drug use: No  . Sexual activity: Yes   Other Topics  Concern  . Not on file   Social History Narrative  . No narrative on file     reports that he has quit smoking. His smoking use included Cigarettes. He has never used smokeless tobacco. He reports that he drinks about 1.2 - 1.8 oz of alcohol per week . He reports that he does not use drugs.  History reviewed. No pertinent family history. No family status information on file.     There is no immunization history on file for this patient.  Allergies  Allergen Reactions  . Codeine Nausea And Vomiting and Other (See Comments)    Sick on my stomach  . Sulfa Antibiotics Nausea And Vomiting and Other (See Comments)    sick    Medications: Patient's Medications    New Prescriptions   No medications on file  Previous Medications   ASPIRIN EC 81 MG EC TABLET    Take 1 tablet (81 mg total) by mouth daily.   ASPIRIN-ACETAMINOPHEN-CAFFEINE (EXCEDRIN MIGRAINE) 250-250-65 MG TABLET    Take 2 tablets by mouth every 6 (six) hours as needed for headache.   DIAZEPAM (VALIUM) 5 MG TABLET    Take 1 tablet (5 mg total) by mouth daily.   ESCITALOPRAM (LEXAPRO) 5 MG TABLET    Take 10 mg by mouth daily.   OXYCODONE-ACETAMINOPHEN (PERCOCET/ROXICET) 5-325 MG TABLET    Take 1 tablet by mouth every 6 (six) hours as needed (hip/low back pain).   POLYETHYLENE GLYCOL (MIRALAX / GLYCOLAX) PACKET    Take 17 g by mouth daily.   SACCHAROMYCES BOULARDII (FLORASTOR) 250 MG CAPSULE    Take 250 mg by mouth daily.   SODIUM PHOSPHATES (ENEMA) 7-19 GM/118ML ENEM    Place 118 mLs rectally daily as needed.  Modified Medications   No medications on file  Discontinued Medications   PROBIOTIC PRODUCT (PROBIOTIC DAILY) CAPS    Take 1 capsule by mouth daily.    Review of Systems  Unable to perform ROS: Psychiatric disorder (expressive aphasia)    Vitals:   12/19/15 1016  BP: 126/62  Pulse: 70  Resp: 18  Temp: 98 F (36.7 C)  TempSrc: Oral  SpO2: 98%  Weight: 144 lb 12.8 oz (65.7 kg)  Height: '5\' 9"'$  (1.753 m)   Body mass index is 21.38 kg/m.  Physical Exam  Constitutional: He is oriented to person, place, and time. He appears well-developed and well-nourished.  Frail appearing in NAD sitting up in bed  HENT:  Mouth/Throat: Oropharynx is clear and moist.  MMM  Eyes: Pupils are equal, round, and reactive to light. No scleral icterus.  Neck: Neck supple. Carotid bruit is not present. No thyromegaly present.  Cardiovascular: Normal rate, regular rhythm, normal heart sounds and intact distal pulses.  Exam reveals no gallop and no friction rub.   No murmur heard. no distal LE swelling. No calf TTP  Pulmonary/Chest: Effort normal and breath sounds normal. He has no wheezes. He  has no rales. He exhibits no tenderness.  Abdominal: Soft. Bowel sounds are normal. He exhibits no distension, no abdominal bruit, no pulsatile midline mass and no mass. There is no hepatomegaly. There is no tenderness. There is no rebound and no guarding.  Musculoskeletal: He exhibits edema (RLE>RUE), tenderness and deformity (right 4th toe; NT).  RUE contracture - flexion at wrist  Lymphadenopathy:    He has no cervical adenopathy.  Neurological: He is alert and oriented to person, place, and time. He has normal reflexes.  Right hemiplegia and right hemiparesis  with right foot drop  Skin: Skin is warm and dry. No rash noted.  Psychiatric: Thought content normal. His mood appears anxious. His speech is slurred. He is agitated.     Labs reviewed: Admission on 12/13/2015, Discharged on 12/16/2015  Component Date Value Ref Range Status  . Prothrombin Time 12/13/2015 16.6* 11.4 - 15.2 seconds Final  . INR 12/13/2015 1.33   Final  . aPTT 12/13/2015 35  24 - 36 seconds Final  . WBC 12/13/2015 7.9  4.0 - 10.5 K/uL Final  . RBC 12/13/2015 4.56  4.22 - 5.81 MIL/uL Final  . Hemoglobin 12/13/2015 15.0  13.0 - 17.0 g/dL Final  . HCT 12/13/2015 45.6  39.0 - 52.0 % Final  . MCV 12/13/2015 100.0  78.0 - 100.0 fL Final  . MCH 12/13/2015 32.9  26.0 - 34.0 pg Final  . MCHC 12/13/2015 32.9  30.0 - 36.0 g/dL Final  . RDW 12/13/2015 13.9  11.5 - 15.5 % Final  . Platelets 12/13/2015 134* 150 - 400 K/uL Final  . Neutrophils Relative % 12/13/2015 48  % Final  . Neutro Abs 12/13/2015 3.8  1.7 - 7.7 K/uL Final  . Lymphocytes Relative 12/13/2015 33  % Final  . Lymphs Abs 12/13/2015 2.6  0.7 - 4.0 K/uL Final  . Monocytes Relative 12/13/2015 15  % Final  . Monocytes Absolute 12/13/2015 1.2* 0.1 - 1.0 K/uL Final  . Eosinophils Relative 12/13/2015 3  % Final  . Eosinophils Absolute 12/13/2015 0.2  0.0 - 0.7 K/uL Final  . Basophils Relative 12/13/2015 1  % Final  . Basophils Absolute 12/13/2015 0.1  0.0 - 0.1  K/uL Final  . Sodium 12/13/2015 138  135 - 145 mmol/L Final  . Potassium 12/13/2015 3.9  3.5 - 5.1 mmol/L Final  . Chloride 12/13/2015 107  101 - 111 mmol/L Final  . CO2 12/13/2015 19* 22 - 32 mmol/L Final  . Glucose, Bld 12/13/2015 85  65 - 99 mg/dL Final  . BUN 12/13/2015 6  6 - 20 mg/dL Final  . Creatinine, Ser 12/13/2015 0.69  0.61 - 1.24 mg/dL Final  . Calcium 12/13/2015 8.9  8.9 - 10.3 mg/dL Final  . Total Protein 12/13/2015 7.9  6.5 - 8.1 g/dL Final  . Albumin 12/13/2015 3.2* 3.5 - 5.0 g/dL Final  . AST 12/13/2015 67* 15 - 41 U/L Final  . ALT 12/13/2015 31  17 - 63 U/L Final  . Alkaline Phosphatase 12/13/2015 120  38 - 126 U/L Final  . Total Bilirubin 12/13/2015 3.2* 0.3 - 1.2 mg/dL Final  . GFR calc non Af Amer 12/13/2015 >60  >60 mL/min Final  . GFR calc Af Amer 12/13/2015 >60  >60 mL/min Final   Comment: (NOTE) The eGFR has been calculated using the CKD EPI equation. This calculation has not been validated in all clinical situations. eGFR's persistently <60 mL/min signify possible Chronic Kidney Disease.   . Anion gap 12/13/2015 12  5 - 15 Final  . Troponin i, poc 12/13/2015 0.00  0.00 - 0.08 ng/mL Final  . Comment 3 12/13/2015          Final   Comment: Due to the release kinetics of cTnI, a negative result within the first hours of the onset of symptoms does not rule out myocardial infarction with certainty. If myocardial infarction is still suspected, repeat the test at appropriate intervals.   . Sodium 12/13/2015 138  135 - 145 mmol/L Final  . Potassium 12/13/2015 3.8  3.5 - 5.1 mmol/L  Final  . Chloride 12/13/2015 106  101 - 111 mmol/L Final  . BUN 12/13/2015 6  6 - 20 mg/dL Final  . Creatinine, Ser 12/13/2015 0.70  0.61 - 1.24 mg/dL Final  . Glucose, Bld 12/13/2015 82  65 - 99 mg/dL Final  . Calcium, Ion 12/13/2015 1.04* 1.15 - 1.40 mmol/L Final  . TCO2 12/13/2015 21  0 - 100 mmol/L Final  . Hemoglobin 12/13/2015 16.7  13.0 - 17.0 g/dL Final  . HCT 12/13/2015  49.0  39.0 - 52.0 % Final  . Color, Urine 12/14/2015 AMBER* YELLOW Final  . APPearance 12/14/2015 CLEAR  CLEAR Final  . Specific Gravity, Urine 12/14/2015 1.019  1.005 - 1.030 Final  . pH 12/14/2015 6.0  5.0 - 8.0 Final  . Glucose, UA 12/14/2015 NEGATIVE  NEGATIVE mg/dL Final  . Hgb urine dipstick 12/14/2015 NEGATIVE  NEGATIVE Final  . Bilirubin Urine 12/14/2015 SMALL* NEGATIVE Final  . Ketones, ur 12/14/2015 NEGATIVE  NEGATIVE mg/dL Final  . Protein, ur 12/14/2015 NEGATIVE  NEGATIVE mg/dL Final  . Nitrite 12/14/2015 NEGATIVE  NEGATIVE Final  . Leukocytes, UA 12/14/2015 NEGATIVE  NEGATIVE Final  . Vitamin B-12 12/14/2015 716  180 - 914 pg/mL Final   Comment: (NOTE) This assay is not validated for testing neonatal or myeloproliferative syndrome specimens for Vitamin B12 levels.   . Cholesterol 12/14/2015 126  0 - 200 mg/dL Final  . Triglycerides 12/14/2015 49  <150 mg/dL Final  . HDL 12/14/2015 31* >40 mg/dL Final  . Total CHOL/HDL Ratio 12/14/2015 4.1  RATIO Final  . VLDL 12/14/2015 10  0 - 40 mg/dL Final  . LDL Cholesterol 12/14/2015 85  0 - 99 mg/dL Final   Comment:        Total Cholesterol/HDL:CHD Risk Coronary Heart Disease Risk Table                     Men   Women  1/2 Average Risk   3.4   3.3  Average Risk       5.0   4.4  2 X Average Risk   9.6   7.1  3 X Average Risk  23.4   11.0        Use the calculated Patient Ratio above and the CHD Risk Table to determine the patient's CHD Risk.        ATP III CLASSIFICATION (LDL):  <100     mg/dL   Optimal  100-129  mg/dL   Near or Above                    Optimal  130-159  mg/dL   Borderline  160-189  mg/dL   High  >190     mg/dL   Very High   . RPR Ser Ql 12/14/2015 Non Reactive  Non Reactive Final   Comment: (NOTE) Performed At: [facility] [place], [place] [id number] Lindon Romp MD ML:[id number]   . Folate 12/14/2015 14.3  >5.9 ng/mL Final  . TSH 12/14/2015 2.523  0.350 - 4.500 uIU/mL  Final  . Sodium 12/15/2015 135  135 - 145 mmol/L Final  . Potassium 12/15/2015 4.0  3.5 - 5.1 mmol/L Final  . Chloride 12/15/2015 105  101 - 111 mmol/L Final  . CO2 12/15/2015 27  22 - 32 mmol/L Final  . Glucose, Bld 12/15/2015 102* 65 - 99 mg/dL Final  . BUN 12/15/2015 8  6 - 20 mg/dL Final  . Creatinine, Ser 12/15/2015  0.69  0.61 - 1.24 mg/dL Final  . Calcium 12/15/2015 8.6* 8.9 - 10.3 mg/dL Final  . GFR calc non Af Amer 12/15/2015 >60  >60 mL/min Final  . GFR calc Af Amer 12/15/2015 >60  >60 mL/min Final   Comment: (NOTE) The eGFR has been calculated using the CKD EPI equation. This calculation has not been validated in all clinical situations. eGFR's persistently <60 mL/min signify possible Chronic Kidney Disease.   . Anion gap 12/15/2015 3* 5 - 15 Final  . WBC 12/15/2015 5.6  4.0 - 10.5 K/uL Final  . RBC 12/15/2015 3.86* 4.22 - 5.81 MIL/uL Final  . Hemoglobin 12/15/2015 12.3* 13.0 - 17.0 g/dL Final   Comment: REPEATED TO VERIFY DELTA CHECK NOTED   . HCT 12/15/2015 38.8* 39.0 - 52.0 % Final  . MCV 12/15/2015 100.5* 78.0 - 100.0 fL Final  . MCH 12/15/2015 31.9  26.0 - 34.0 pg Final  . MCHC 12/15/2015 31.7  30.0 - 36.0 g/dL Final  . RDW 12/15/2015 14.0  11.5 - 15.5 % Final  . Platelets 12/15/2015 124* 150 - 400 K/uL Final  . Sodium 12/16/2015 138  135 - 145 mmol/L Final  . Potassium 12/16/2015 4.3  3.5 - 5.1 mmol/L Final  . Chloride 12/16/2015 107  101 - 111 mmol/L Final  . CO2 12/16/2015 26  22 - 32 mmol/L Final  . Glucose, Bld 12/16/2015 91  65 - 99 mg/dL Final  . BUN 12/16/2015 8  6 - 20 mg/dL Final  . Creatinine, Ser 12/16/2015 0.70  0.61 - 1.24 mg/dL Final  . Calcium 12/16/2015 8.6* 8.9 - 10.3 mg/dL Final  . GFR calc non Af Amer 12/16/2015 >60  >60 mL/min Final  . GFR calc Af Amer 12/16/2015 >60  >60 mL/min Final   Comment: (NOTE) The eGFR has been calculated using the CKD EPI equation. This calculation has not been validated in all clinical situations. eGFR's  persistently <60 mL/min signify possible Chronic Kidney Disease.   . Anion gap 12/16/2015 5  5 - 15 Final    Dg Ankle Complete Right  Result Date: 12/13/2015 CLINICAL DATA:  Pain following recent falls EXAM: RIGHT ANKLE - COMPLETE 3+ VIEW COMPARISON:  None. FINDINGS: Frontal, oblique and lateral views obtained. There is soft tissue swelling, primarily laterally. No acute fracture or joint effusion evident. Ankle mortise appears intact. There is mild osteoarthritic change in the talonavicular joint region. No erosive change. IMPRESSION: Soft tissue swelling laterally. No fracture. Ankle mortise appears intact. 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 Electronically Signed   By: Lowella Grip III M.D.   On: 12/13/2015 19:53   Ct Head Wo Contrast  Result Date: 12/13/2015 CLINICAL DATA:  Left-sided weakness, difficulty walking. EXAM: CT HEAD WITHOUT CONTRAST TECHNIQUE: Contiguous axial images were obtained from the base of the skull through the vertex without intravenous contrast. COMPARISON:  CT scan of Aug 01, 2006. FINDINGS: Brain: Old right frontal and left parietal infarctions are noted. No mass effect or midline shift is noted. Ventricular size is within normal limits. There is no evidence of mass lesion, hemorrhage or acute infarction. Vascular: Atherosclerosis of internal carotid arteries is noted. Skull: Bony calvarium appears intact. Sinuses/Orbits: Visualized paranasal sinuses are unremarkable. Other: None. IMPRESSION: Old right frontal and left parietal infarctions. No acute intracranial abnormality seen. Electronically Signed   By: Marijo Conception, M.D.   On: 12/13/2015 16:16   Mr Jeri Cos GM Contrast  Result Date: 12/14/2015 CLINICAL DATA:  69 year old male with residual  right side weakness from prior stroke. Multiple falls recently. Right extremity weakness and decreased sensation. Initial encounter. EXAM: MRI HEAD WITHOUT AND WITH CONTRAST TECHNIQUE: Multiplanar,  multiecho pulse sequences of the brain and surrounding structures were obtained without and with intravenous contrast. CONTRAST:  55m MULTIHANCE GADOBENATE DIMEGLUMINE 529 MG/ML IV SOLN COMPARISON:  Head CT without contrast 12/13/2015 and earlier. FINDINGS: Brain: No restricted diffusion or evidence of acute infarction. Chronic infarcts in the middle and posterior left MCA, distal left ACA, and anterior right MCA vascular territories with cystic encephalomalacia. Associated white matter gliosis. No chronic cerebral blood products. Along the right anterior convexity abutting the anterior dura and anterior interhemispheric fissure there is a chronic calcified extra-axial lesion with mild enhancement encompassing 15 x 11 x 20 mm (AP by transverse by CC). This appears not significantly change since 2008. Mild adjacent dural thickening. No significant associated mass effect. No associated cerebral edema. No other abnormal intracranial enhancement or dural thickening. No midline shift, mass effect, ventriculomegaly, extra-axial collection or acute intracranial hemorrhage. Cervicomedullary junction and pituitary are within normal limits. Vascular: Absent left ICA flow void in the visualized upper neck and throughout the left siphon. Evidence of reconstituted flow at the left ICA terminus, probably in part from the left posterior communicating artery which is visible. Other Major intracranial vascular flow voids are preserved, the distal right vertebral artery is dominant and dolichoectatic. Skull and upper cervical spine: Normal bone marrow signal. Mild disc degeneration partially visible at C3-C4. Sinuses/Orbits: Negative orbit soft tissues. Scattered ethmoid sinus mucosal thickening and opacification. Other paranasal sinuses are clear. Other: Visible internal auditory structures appear normal. Mastoids are clear. Negative scalp soft tissues, incidental left posterior scalp lipoma (series 11, image 6). IMPRESSION: 1.  No  acute intracranial abnormality. 2. 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 3. 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 Electronically Signed   By: HGenevie AnnM.D.   On: 12/14/2015 10:03   Mr Cervical Spine Wo Contrast  Result Date: 12/15/2015 CLINICAL DATA:  Frequent falls and marked weakness, particularly in the right lower extremity. EXAM: MRI CERVICAL SPINE WITHOUT CONTRAST TECHNIQUE: Multiplanar, multisequence MR imaging of the cervical spine was performed. No intravenous contrast was administered. COMPARISON:  None. FINDINGS: Alignment: There is mild reversal lordosis. Vertebrae: Height is maintained. Scattered hemangiomas are seen. No worrisome marrow lesion. Cord: Normal signal throughout. Posterior Fossa, vertebral arteries, paraspinal tissues: Unremarkable. Disc levels: C2-3:  Negative. C3-4: Disc osteophyte complex and bilateral uncovertebral disease are seen. There is flattening of the ventral cord and severe bilateral foraminal narrowing. C4-5: Shallow disc osteophyte complex and bilateral uncovertebral disease. There is mild flattening of the ventral cord. Moderately severe to severe foraminal narrowing appears worse on the left. C5-6: Shallow disc bulge with endplate spurring and uncovertebral disease. The ventral thecal sac is nearly effaced. Moderately severe to severe foraminal narrowing is worse on the left. C6-7: Shallow disc bulge and uncovertebral disease are seen. The ventral thecal sac is narrowed but not effaced. Moderately severe to severe foraminal narrowing is worse on the right. C7-T1:  Negative. IMPRESSION: Normal appearing cervical cord. No finding to explain the patient's symptoms. 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 Please see above for descriptions of individual  levels. Electronically Signed   By: TInge RiseM.D.   On: 12/15/2015 09:00   Dg Foot  Complete Right  Result Date: 12/13/2015 CLINICAL DATA:  Pain following several recent falls EXAM: RIGHT FOOT COMPLETE - 3+ VIEW COMPARISON:  None. FINDINGS: Frontal, oblique, and lateral views were obtained. There is a fracture of the proximal aspect of the fourth proximal phalanx with slight impaction at the fracture site. No other acute fracture is evident. No dislocation. There is evidence of remodeling in the cuboid bone consistent with prior trauma in this area. There is no dislocation. Joint spaces appear normal. No erosive change. IMPRESSION: Acute fracture proximal aspect fourth proximal phalanx with slight impaction at the fracture site. No other acute fracture evident. Old trauma with remodeling involving the cuboid bone. No dislocation. No appreciable joint space narrowing. Electronically Signed   By: Lowella Grip III M.D.   On: 12/13/2015 19:52   Dg Hip Unilat With Pelvis 2-3 Views Right  Result Date: 12/13/2015 CLINICAL DATA:  Several recent falls with pain EXAM: DG HIP (WITH OR WITHOUT PELVIS) 2-3V RIGHT COMPARISON:  None. FINDINGS: Frontal pelvis as well as frontal and lateral right hip images were obtained. There is no fracture or dislocation. There is mild narrowing of each hip joint. A lucency is noted in the superior left femoral head. IMPRESSION: No fracture or dislocation. Mild symmetric narrowing of both hip joints. Note that on the pelvic image, there is a lucency in the lateral left femoral head, potentially a focus of avascular necrosis. Note that MR is the imaging study of choice to assess for avascular necrosis. Electronically Signed   By: Lowella Grip III M.D.   On: 12/13/2015 19:50     Assessment/Plan   ICD-9-CM ICD-10-CM   1. Adjustment reaction with anxiety and depression 309.28 F43.23   2. Gait instability 781.2 R26.81   3. History of CVA (cerebrovascular accident)  V12.54 Z86.73   4. Hemiparesis of right dominant side due to cerebrovascular disease, unspecified cerebrovascular disease type ([place]) 438.21 I67.9     G81.91   5. Nondisplaced fracture of proximal phalanx of right lesser toe(s), initial encounter for closed fracture 826.0 S92.514A   6. Chronic pain syndrome 338.4 G89.4   7. Osteoarthritis of cervical spine, unspecified spinal osteoarthritis complication status 443.1 M47.812      Increase diazepam to BID  Bedside commode if able to transfer with assist  PT/OT/ST as ordered  MRI left hip without gad to further assess left femur head lucency seen on pelvic xray  Fall precautions  F/u with ortho and pain mx  Will have dietary help him with food choices  Cont other meds as ordered  GOAL: short term rehab and d/c home when medically appropriate. Communicated with pt and nursing.  Will follow  Kahil Agner S. Perlie Gold  [facility] [address] [place], [place] [address] [phone] Cell (Monday-Friday 8 AM - 5 PM) [phone] After 5 PM and follow prompts

## 2015-12-23 ENCOUNTER — Other Ambulatory Visit: Payer: Self-pay | Admitting: Internal Medicine

## 2015-12-23 DIAGNOSIS — M87052 Idiopathic aseptic necrosis of left femur: Secondary | ICD-10-CM

## 2015-12-28 ENCOUNTER — Ambulatory Visit (HOSPITAL_COMMUNITY)
Admission: RE | Admit: 2015-12-28 | Discharge: 2015-12-28 | Disposition: A | Discharge status: Home / Self Care | Payer: Medicare Other | Source: Ambulatory Visit | Attending: Internal Medicine | Admitting: Internal Medicine

## 2015-12-28 ENCOUNTER — Ambulatory Visit (HOSPITAL_COMMUNITY): Admission: RE | Admit: 2015-12-28 | Payer: Medicare Other | Source: Ambulatory Visit

## 2015-12-28 DIAGNOSIS — S73102A Unspecified sprain of left hip, initial encounter: Secondary | ICD-10-CM | POA: Diagnosis not present

## 2015-12-28 DIAGNOSIS — X58XXXA Exposure to other specified factors, initial encounter: Secondary | ICD-10-CM | POA: Diagnosis not present

## 2015-12-28 DIAGNOSIS — M87052 Idiopathic aseptic necrosis of left femur: Secondary | ICD-10-CM | POA: Insufficient documentation

## 2015-12-28 MED ORDER — GADOBENATE DIMEGLUMINE 529 MG/ML IV SOLN
10.0000 mL | Freq: Once | INTRAVENOUS | Status: AC | PRN
  Administered 2015-12-28: 10 mL via INTRAVENOUS

## 2015-12-30 ENCOUNTER — Encounter: Payer: Self-pay | Admitting: Adult Health

## 2015-12-30 ENCOUNTER — Non-Acute Institutional Stay (SKILLED_NURSING_FACILITY): Payer: Medicare Other | Admitting: Adult Health

## 2015-12-30 DIAGNOSIS — M87052 Idiopathic aseptic necrosis of left femur: Secondary | ICD-10-CM

## 2015-12-30 NOTE — Progress Notes (Signed)
Patient ID: Neil Lambert, male   DOB: October 17, 1945, 70 y.o.   MRN: [id number]   Location:   [facility] Room Number: 129-A Place of Service:  SNF (31)   CODE STATUS:   Allergies  Allergen Reactions  . Codeine Nausea And Vomiting and Other (See Comments)    Sick on my stomach  . Sulfa Antibiotics Nausea And Vomiting and Other (See Comments)    sick    Chief Complaint  Patient presents with  . 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 He does complain of left hip pain. We discussed the results and will setup an orthopedic consult for him. I have spoken with his family; they are in agreement with the plan.    Past Medical History:  Diagnosis Date  . Depression   . HCV (hepatitis C virus)   . Headache   . Hyperlipidemia   . Stroke [facility](HCC)     Past Surgical History:  Procedure Laterality Date  . CHOLECYSTECTOMY    . FOOT SURGERY     with metal brace in but  not any more   worn  brace on rt foot for 6 yrs    Social History   Social History  . Marital status: Single    Spouse name: N/A  . Number of children: N/A  . Years of education: N/A   Occupational History  . Not on file.   Social History Main Topics  . Smoking status: Former Smoker    Types: Cigarettes  . Smokeless tobacco: Never Used  . Alcohol use 1.2 - 1.8 oz/week    2 - 3 Cans of beer per week     Comment: * packs a week  . Drug use: No  . Sexual activity: Yes   Other Topics Concern  . Not on file   Social History Narrative  . No narrative on file   History reviewed. No pertinent family history.    VITAL SIGNS BP 125/70   Pulse 70   Temp 97.8 F (36.6 C) (Oral)   Resp 20   Ht 5\' 9"  (1.753 m)   Wt 155 lb 2 oz (70.4 kg)   SpO2 95%   BMI 22.91 kg/m   Patient's Medications  New Prescriptions   No medications on file  Previous Medications   ASPIRIN EC 81 MG EC TABLET     Take 1 tablet (81 mg total) by mouth daily.   ASPIRIN-ACETAMINOPHEN-CAFFEINE (EXCEDRIN MIGRAINE) 250-250-65 MG TABLET    Take 2 tablets by mouth every 6 (six) hours as needed for headache.   DIAZEPAM (VALIUM) 5 MG TABLET    Take 1 tablet (5 mg total) by mouth daily.   ESCITALOPRAM (LEXAPRO) 5 MG TABLET    Take 10 mg by mouth daily.   OXYCODONE-ACETAMINOPHEN (PERCOCET/ROXICET) 5-325 MG TABLET    Take 1 tablet by mouth every 6 (six) hours as needed (hip/low back pain).   POLYETHYLENE GLYCOL (MIRALAX / GLYCOLAX) PACKET    Take 17 g by mouth daily.   SACCHAROMYCES BOULARDII (FLORASTOR) 250 MG CAPSULE    Take 250 mg by mouth daily.   SODIUM PHOSPHATES (ENEMA) 7-19 GM/118ML ENEM    Place 118 mLs rectally daily as needed.  Modified Medications   No medications on file  Discontinued Medications   No medications on file     SIGNIFICANT DIAGNOSTIC  EXAMS  12-13-15: right ankle x-ray: Soft tissue swelling laterally. No fracture. Ankle mortise appears intact. 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  12-13-15: right foot x-ray: Acute fracture proximal aspect fourth proximal phalanx with slight impaction at the fracture site. No other acute fracture evident. Old trauma with remodeling involving the cuboid bone. No dislocation. No appreciable joint space narrowing.   12-13-15: pelvic x-ray: No fracture or dislocation. Mild symmetric narrowing of both hip joints. Note that on the pelvic image, there is a lucency in the lateral left femoral head, potentially a focus of avascular necrosis. Note that MR is the imaging study of choice to assess for avascular necrosis.  12-14-15: MRI of brain: 1.  No acute intracranial abnormality. 2. 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 3. 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  12-15-15: MRI of cervical spine: Normal appearing cervical  cord. No finding to explain the patient's symptoms.  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 Please see above for descriptions of individual levels. Normal appearing cervical cord. No finding to explain the patient's symptoms. 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 Please see above for descriptions of individual levels.   12-29-15: MRI of left hip 1. 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 There is internal enhancement in a trace amount of surrounding marrow edema. 2. Mild-to-moderate degenerative findings in both hips. 3. Small tear of the anterior superior acetabular labrum on the left. 4. There is some patchy muscular edema especially on the right side involving the quadriceps, gluteus minimus, and gluteus medius. This is nonspecific but could reflect a low-grade myositis or muscular strains. Some of this edema tracks deep to the right iliotibial band. 5. Marrow heterogeneity is present. Although this can be caused by marrow infiltrative processes, the most common causes include anemia, smoking, obesity, or advancing age.    LABS REVIEWED:   12-14-15: vit B 12: 716; folate 14.3; tsh 2.523; RPR; nr; chol 126; ldl 85; trig 49; hdl 31 12-15-15: wbc 5.6; hgb 12.3; hct 38.8; mcv 100.5; plt 124; glucose 102; bun 8; creat 0.69; k+ 4.0; na++ 135    Review of Systems  Constitutional: Negative for malaise/fatigue.  Respiratory: Negative for cough and shortness of breath.   Cardiovascular: Negative for chest pain, palpitations and leg swelling.  Gastrointestinal: Negative for abdominal pain, constipation and heartburn.  Musculoskeletal: Positive for joint pain. Negative for back pain and myalgias.       Left hip pain   Skin: Negative.   Neurological: Negative for dizziness.    Psychiatric/Behavioral: The patient is not nervous/anxious.     Physical Exam  Constitutional: He is oriented to person, place, and time. No distress.  Eyes: Conjunctivae are normal.  Neck: Neck supple. No JVD present. No thyromegaly present.  Cardiovascular: Normal rate, regular rhythm and intact distal pulses.   Respiratory: Effort normal and breath sounds normal. No respiratory distress. He has no wheezes.  GI: Soft. Bowel sounds are normal. He exhibits no distension. There is no tenderness.  Musculoskeletal: He exhibits no edema.  Has right hemiparesis   Lymphadenopathy:    He has no cervical adenopathy.  Neurological: He is alert and oriented to person, place, and time.  Skin: Skin is warm and dry. He  is not diaphoretic.  Psychiatric: He has a normal mood and affect.     ASSESSMENT/ PLAN:  1. Left hip avascular necrosis: 1.5 x 2.4 x 0.8 cm on left hip on 12-29-15 .  Will setup an orthopedic consult and will begin fosamax 70 mg weekly and will monitor    Time spent with patient  30  minutes >50% time spent counseling; reviewing medical record; tests; labs; and developing future plan of care   MD is aware of resident's narcotic use and is in agreement with current plan of care. We will attempt to wean resident as appropriate.     Synthia Innocent NP [facility]  Contact [phone] Monday through Friday 8am- 5pm  After hours call [phone]

## 2016-01-03 ENCOUNTER — Non-Acute Institutional Stay (SKILLED_NURSING_FACILITY): Payer: Medicare Other | Admitting: Internal Medicine

## 2016-01-03 ENCOUNTER — Encounter: Payer: Self-pay | Admitting: Adult Health

## 2016-01-03 ENCOUNTER — Encounter: Payer: Self-pay | Admitting: Internal Medicine

## 2016-01-03 DIAGNOSIS — M791 Myalgia: Secondary | ICD-10-CM

## 2016-01-03 DIAGNOSIS — M8700 Idiopathic aseptic necrosis of unspecified bone: Secondary | ICD-10-CM

## 2016-01-03 DIAGNOSIS — M7918 Myalgia, other site: Secondary | ICD-10-CM

## 2016-01-03 NOTE — Progress Notes (Signed)
Patient ID: Neil Lambert, male   DOB: 03/10/1946, 70 y.o.   MRN: [id number]   Location:  [facility] Room Number: 129-A Place of Service:  SNF (31) Provider:  Edmon Crape, PA-C  Garlan Fillers, MD  Patient Care Team: Jarome Matin, MD as PCP - General (Internal Medicine)  Extended Emergency Contact Information Primary Emergency Contact: OTT,CHARLES Address: [address] [place]  [address] Home Phone: [phone] Relation: None Secondary Emergency Contact: Johna Sheriff [place] of [place] Home Phone: [phone] Relation: Significant other  Code Status:  DNR Goals of care: Advanced Directive information Advanced Directives 12/19/2015  Does patient have an advance directive? Yes  Type of Advance Directive Out of facility DNR (pink MOST or yellow form)  Does patient want to make changes to advanced directive? No - Patient declined  Copy of advanced directive(s) in chart? Yes  Would patient like information on creating an advanced directive? -     Chief Complaint  Patient presents with  . Acute Visit    Acute  Acute visit secondary to right buttocks pain  HPI:  Pt is a 70 y.o. male seen today for an acute visit for follow-up of complaints of rightbuttocks pain.  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  Says it does not really itch-- just feels uncomfortable at times     Past Medical History:  Diagnosis Date  . Depression   . HCV (hepatitis C virus)   . Headache   . Hyperlipidemia   . Stroke [facility])    Past Surgical History:  Procedure Laterality Date  .  CHOLECYSTECTOMY    . FOOT SURGERY     with metal brace in but  not any more   worn  brace on rt foot for 6 yrs    Allergies  Allergen Reactions  . Codeine Nausea And Vomiting and Other (See Comments)    Sick on my stomach  . Sulfa Antibiotics Nausea And Vomiting and Other (See Comments)    sick      Medication List       Accurate as of 01/03/16 10:56 AM. Always use your most recent med list.          alendronate 70 MG tablet Commonly known as:  FOSAMAX Take 70 mg by mouth once a week. Take with a full glass of water on an empty stomach.   aspirin 81 MG EC tablet Take 1 tablet (81 mg total) by mouth daily.   aspirin-acetaminophen-caffeine 250-250-65 MG tablet Commonly known as:  EXCEDRIN MIGRAINE Take 2 tablets by mouth every 6 (six) hours as needed for headache.   diazepam 5 MG tablet Commonly known as:  VALIUM Take 1 tablet (5 mg total) by mouth daily.   Enema 7-19 GM/118ML Enem Place 118 mLs rectally daily as needed.   escitalopram 5 MG tablet Commonly known as:  LEXAPRO Take 10 mg by mouth daily.   oxyCODONE-acetaminophen 5-325 MG tablet Commonly known as:  PERCOCET/ROXICET Take 1 tablet by mouth every 6 (six) hours as needed (hip/low back pain).   polyethylene glycol packet  Commonly known as:  MIRALAX / GLYCOLAX Take 17 g by mouth daily.   saccharomyces boulardii 250 MG capsule Commonly known as:  FLORASTOR Take 250 mg by mouth daily.       Review of Systems   Somewhat limited secondary to patient being somewhat of a poor strain supplied by nursing as well.  In general does not complaining of any fever or chills.  Skin does complain of some right buttocks discomfort patient attributes to more skin issues.  . Ears eyes nose mouth and throat does not complain of acute visual changes has prescription lenses does not complain of sore throat.  Respiratory does not complain of shortness breath or cough.  Cardiac no chest pain has some mild lower  extremity edema.  GI is not complaining of abdominal discomfort nausea vomiting diarrhea or constipation. CAT well at lunch.  Muscle skeletal complains at times of joint pain appears to be somewhat diffuse and more so in his legs.  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  Psych-does not complain of overt anxiety or depression but does at times apparently appear frustrated   There is no immunization history on file for this patient. Pertinent  Health Maintenance Due  Topic Date Due  . INFLUENZA VACCINE  10/11/2015  . PNA vac Low Risk Adult (1 of 2 - PCV13) 12/29/2016 (Originally 05/28/2009)  . COLONOSCOPY  07/09/2016   No flowsheet data found. Functional Status Survey:    Vitals:   01/03/16 1053  BP: 138/82  Pulse: (!) 59  Resp: 20  Temp: 97.6 F (36.4 C)  TempSrc: Oral  SpO2: 96%  Weight: 155 lb 2 oz (70.4 kg)  Height: 5\' 9"  (1.753 m)   Body mass index is 22.91 kg/m. Physical Exam Constitutional: He is oriented to person, place, and time. He appears well-developed and well-nourished.  Frail appearing in NAD sitting up in bed  HENT:  Mouth/Throat: Oropharynx is clear and moist.  MMM  Eyes: Visual acuity appears grossly intact he has prescription lenses   .  Cardiovascular: Normal rate, regular rhythm, normal heart sounds and intact distal pulses.  Exam reveals no gallop and no friction rub.   No murmur heard.  minimal LE swelling. More so on the right No calf TTP  Pulmonary/Chest: Effort normal and breath sounds normal. He has no wheezes. He has no rales. He exhibits no tenderness.  Abdominal: Soft. Bowel sounds are normal. He exhibits no distension, no abdominal bruit, no pulsatile midline mass and no mass. There is no hepatomegaly. There is no tenderness. There is no rebound and no guarding.  Musculoskeletal: He exhibits edema (RLE>RUE), tenderness and deformity (right 4th toe; NT).  RUE  contracture - flexion at wrist   .  Neurological: He appears largely alert and oriented although appears to have somecognitive  deficits.  Right hemiplegia and right hemiparesis with right foot drop  Skin: Skin is warm and dry. No rash noted.-Buttocks on the right lower area has some erythema but this does not appear to be significant rash or cellulitis appears possibly to be more of a irritation contact-it is not really tender to palpation or warm  Psychiatric:  he is largely pleasant and appropriate-appears to have some confusion at times d.    Labs reviewed:  Recent Labs  12/13/15 1541 12/13/15 1556 12/15/15 0219 12/16/15 0549  NA 138 138 135 138  K 3.9 3.8 4.0 4.3  CL 107 106 105 107  CO2 19*  --  27 26  GLUCOSE 85 82 102* 91  BUN 6 6 8 8   CREATININE 0.69 0.70 0.69 0.70  CALCIUM 8.9  --  8.6* 8.6*    Recent Labs  12/13/15 1541  AST 67*  ALT 31  ALKPHOS 120  BILITOT 3.2*  PROT 7.9  ALBUMIN 3.2*    Recent Labs  12/13/15 1541 12/13/15 1556 12/15/15 0219  WBC 7.9  --  5.6  NEUTROABS 3.8  --   --   HGB 15.0 16.7 12.3*  HCT 45.6 49.0 38.8*  MCV 100.0  --  100.5*  PLT 134*  --  124*   Lab Results  Component Value Date   TSH 2.523 12/14/2015   No results found for: HGBA1C Lab Results  Component Value Date   CHOL 126 12/14/2015   HDL 31 (L) 12/14/2015   LDLCALC 85 12/14/2015   TRIG 49 12/14/2015   CHOLHDL 4.1 12/14/2015    Significant Diagnostic Results in last 30 days:  Dg Ankle Complete Right  Result Date: 12/13/2015 CLINICAL DATA:  Pain following recent falls EXAM: RIGHT ANKLE - COMPLETE 3+ VIEW COMPARISON:  None. FINDINGS: Frontal, oblique and lateral views obtained. There is soft tissue swelling, primarily laterally. No acute fracture or joint effusion evident. Ankle mortise appears intact. There is mild osteoarthritic change in the talonavicular joint region. No erosive change. IMPRESSION: Soft tissue swelling laterally. No fracture. Ankle  mortise appears intact. 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 Electronically Signed   By: Bretta Bang III M.D.   On: 12/13/2015 19:53   Ct Head Wo Contrast  Result Date: 12/13/2015 CLINICAL DATA:  Left-sided weakness, difficulty walking. EXAM: CT HEAD WITHOUT CONTRAST TECHNIQUE: Contiguous axial images were obtained from the base of the skull through the vertex without intravenous contrast. COMPARISON:  CT scan of Aug 01, 2006. FINDINGS: Brain: Old right frontal and left parietal infarctions are noted. No mass effect or midline shift is noted. Ventricular size is within normal limits. There is no evidence of mass lesion, hemorrhage or acute infarction. Vascular: Atherosclerosis of internal carotid arteries is noted. Skull: Bony calvarium appears intact. Sinuses/Orbits: Visualized paranasal sinuses are unremarkable. Other: None. IMPRESSION: Old right frontal and left parietal infarctions. No acute intracranial abnormality seen. Electronically Signed   By: Lupita Raider, M.D.   On: 12/13/2015 16:16   Mr Laqueta Jean ZO Contrast  Result Date: 12/14/2015 CLINICAL DATA:  70 year old male with residual right side weakness from prior stroke. Multiple falls recently. Right extremity weakness and decreased sensation. Initial encounter. EXAM: MRI HEAD WITHOUT AND WITH CONTRAST TECHNIQUE: Multiplanar, multiecho pulse sequences of the brain and surrounding structures were obtained without and with intravenous contrast. CONTRAST:  14mL MULTIHANCE GADOBENATE DIMEGLUMINE 529 MG/ML IV SOLN COMPARISON:  Head CT without contrast 12/13/2015 and earlier. FINDINGS: Brain: No restricted diffusion or evidence of acute infarction. Chronic infarcts in the middle and posterior left MCA, distal left ACA, and anterior right MCA vascular territories with cystic encephalomalacia. Associated white matter gliosis. No chronic cerebral blood products. Along the right anterior convexity abutting the anterior dura and  anterior interhemispheric fissure there is a chronic calcified extra-axial lesion with mild enhancement encompassing 15 x 11 x 20 mm (AP by transverse by CC). This appears not significantly change since 2008. Mild adjacent dural thickening. No significant associated mass effect. No associated cerebral edema. No other abnormal intracranial enhancement or dural thickening. No midline shift, mass effect, ventriculomegaly, extra-axial collection or acute intracranial hemorrhage. Cervicomedullary  junction and pituitary are within normal limits. Vascular: Absent left ICA flow void in the visualized upper neck and throughout the left siphon. Evidence of reconstituted flow at the left ICA terminus, probably in part from the left posterior communicating artery which is visible. Other Major intracranial vascular flow voids are preserved, the distal right vertebral artery is dominant and dolichoectatic. Skull and upper cervical spine: Normal bone marrow signal. Mild disc degeneration partially visible at C3-C4. Sinuses/Orbits: Negative orbit soft tissues. Scattered ethmoid sinus mucosal thickening and opacification. Other paranasal sinuses are clear. Other: Visible internal auditory structures appear normal. Mastoids are clear. Negative scalp soft tissues, incidental left posterior scalp lipoma (series 11, image 6). IMPRESSION: 1.  No acute intracranial abnormality. 2. 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 3. 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 Electronically Signed   By: Odessa Fleming M.D.   On: 12/14/2015 10:03   Mr Cervical Spine Wo Contrast  Result Date: 12/15/2015 CLINICAL DATA:  Frequent falls and marked weakness, particularly in the right lower extremity. EXAM: MRI CERVICAL SPINE WITHOUT CONTRAST TECHNIQUE: Multiplanar, multisequence MR imaging of the cervical spine was performed. No  intravenous contrast was administered. COMPARISON:  None. FINDINGS: Alignment: There is mild reversal lordosis. Vertebrae: Height is maintained. Scattered hemangiomas are seen. No worrisome marrow lesion. Cord: Normal signal throughout. Posterior Fossa, vertebral arteries, paraspinal tissues: Unremarkable. Disc levels: C2-3:  Negative. C3-4: Disc osteophyte complex and bilateral uncovertebral disease are seen. There is flattening of the ventral cord and severe bilateral foraminal narrowing. C4-5: Shallow disc osteophyte complex and bilateral uncovertebral disease. There is mild flattening of the ventral cord. Moderately severe to severe foraminal narrowing appears worse on the left. C5-6: Shallow disc bulge with endplate spurring and uncovertebral disease. The ventral thecal sac is nearly effaced. Moderately severe to severe foraminal narrowing is worse on the left. C6-7: Shallow disc bulge and uncovertebral disease are seen. The ventral thecal sac is narrowed but not effaced. Moderately severe to severe foraminal narrowing is worse on the right. C7-T1:  Negative. IMPRESSION: Normal appearing cervical cord. No finding to explain the patient's symptoms. 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 Please see above for descriptions of individual levels. Electronically Signed   By: Drusilla Kanner M.D.   On: 12/15/2015 09:00   Mr Hip Left W Wo Contrast  Result Date: 12/29/2015 CLINICAL DATA:  Right hip pain. Multiple falls. AVN of the left femoral head. EXAM: MRI OF THE LEFT HIP WITHOUT AND WITH CONTRAST TECHNIQUE: Multiplanar, multisequence MR imaging was performed both before and after administration of intravenous contrast. CONTRAST:  10mL MULTIHANCE GADOBENATE DIMEGLUMINE 529 MG/ML IV SOLN COMPARISON:  12/13/2015 FINDINGS: Bones: MRI confirms day 1.5 by 2.4 cm focus of subcortical increased T2 and low T1 signal with a very  low T1 signal rim in the subcortical portion of the left femoral head, favoring focal avascular necrosis. No AVN of the right hip. Subtle marrow edema in the left femoral head. Enhancement of the involved region of AVN without overt flattening. Small degenerative subcortical cystic lesions along the left posterior inferior acetabulum, as on image 10/6. Similar degenerative lesion along the right anterior superior acetabulum. Symmetric head accentuated T2 and low T1 signal is patchy throughout the lower lumbar vertebra, sacrum, and iliac bones, and to a lesser degree in the lower bony pelvis, causing marrow heterogeneity.  Disc desiccation at L5-S1 with loss of disc height. Articular cartilage and labrum Articular cartilage: Moderate left mild right degenerative axial loss of articular cartilage thickness. Labrum: Small tear of the anterior superior acetabular labrum on the left, images 14-15 series 7 Oma no paralabral cyst identified. Joint or bursal effusion Joint effusion:  Absent Bursae:  No regional bursitis Muscles and tendons Muscles and tendons: Low-level abnormal edema in the right quadriceps musculature without a significant degree of enhancement. There is also some mild right gluteus minimus and gluteus medius edema. Other findings Miscellaneous: Subcutaneous edema tracks along both hips. Small amount of fluid signal deep to the right iliotibial band. IMPRESSION: 1. 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 There is internal enhancement in a trace amount of surrounding marrow edema. 2. Mild-to-moderate degenerative findings in both hips. 3. Small tear of the anterior superior acetabular labrum on the left. 4. There is some patchy muscular edema especially on the right side involving the quadriceps, gluteus minimus, and gluteus medius. This is nonspecific but could reflect a low-grade myositis or muscular strains. Some of this edema tracks deep to the right iliotibial band. 5. Marrow  heterogeneity is present. Although this can be caused by marrow infiltrative processes, the most common causes include anemia, smoking, obesity, or advancing age. Electronically Signed   By: Gaylyn Rong M.D.   On: 12/29/2015 11:42   Dg Foot Complete Right  Result Date: 12/13/2015 CLINICAL DATA:  Pain following several recent falls EXAM: RIGHT FOOT COMPLETE - 3+ VIEW COMPARISON:  None. FINDINGS: Frontal, oblique, and lateral views were obtained. There is a fracture of the proximal aspect of the fourth proximal phalanx with slight impaction at the fracture site. No other acute fracture is evident. No dislocation. There is evidence of remodeling in the cuboid bone consistent with prior trauma in this area. There is no dislocation. Joint spaces appear normal. No erosive change. IMPRESSION: Acute fracture proximal aspect fourth proximal phalanx with slight impaction at the fracture site. No other acute fracture evident. Old trauma with remodeling involving the cuboid bone. No dislocation. No appreciable joint space narrowing. Electronically Signed   By: Bretta Bang III M.D.   On: 12/13/2015 19:52   Dg Hip Unilat With Pelvis 2-3 Views Right  Result Date: 12/13/2015 CLINICAL DATA:  Several recent falls with pain EXAM: DG HIP (WITH OR WITHOUT PELVIS) 2-3V RIGHT COMPARISON:  None. FINDINGS: Frontal pelvis as well as frontal and lateral right hip images were obtained. There is no fracture or dislocation. There is mild narrowing of each hip joint. A lucency is noted in the superior left femoral head. IMPRESSION: No fracture or dislocation. Mild symmetric narrowing of both hip joints. Note that on the pelvic image, there is a lucency in the lateral left femoral head, potentially a focus of avascular necrosis. Note that MR is the imaging study of choice to assess for avascular necrosis. Electronically Signed   By: Bretta Bang III M.D.   On: 12/13/2015 19:50    Assessment/Plan Right buttocks  discomfort-physical exam was quite benign he does have a small erythematous area but does not appear to be in acute type of erythema as noted above I have spoken with nursing about this and will try possibly a topical barrier cream or jelly to help with any possible discomfort and monitor.  He again does not appear to be complaining of significant joint pain or bone pain here.  #2 avascular necrosis again orthopedic consult  is pending-he does have oxycodone as needed for pain he appeared to be comfortable today but this will have to be watched.  I also note patient stated he would like some larger sock says sock currently wearing appear to be too tight for his feet I have spoken with nursing with about this and they will try to obtain a larger size  Also will need orthopedic consult in addition to avascular necrosis for his right foot issues with a history of a fracture of the proximal aspect of the fourth proximal phalanx with slight impaction  [id number]-of note greater than 25 minutes spent assessing patient-reviewing his chart-discussing his status with nursing staff as well as discussion with patient about his concerns.

## 2016-01-03 NOTE — Progress Notes (Signed)
This encounter was created in error - please disregard.

## 2016-01-17 ENCOUNTER — Non-Acute Institutional Stay (SKILLED_NURSING_FACILITY): Payer: Medicare Other | Admitting: Internal Medicine

## 2016-01-17 ENCOUNTER — Ambulatory Visit (INDEPENDENT_AMBULATORY_CARE_PROVIDER_SITE_OTHER): Payer: Medicare Other | Admitting: Orthopaedic Surgery

## 2016-01-17 DIAGNOSIS — Z8673 Personal history of transient ischemic attack (TIA), and cerebral infarction without residual deficits: Secondary | ICD-10-CM | POA: Diagnosis not present

## 2016-01-17 DIAGNOSIS — G8191 Hemiplegia, unspecified affecting right dominant side: Secondary | ICD-10-CM

## 2016-01-17 DIAGNOSIS — R7989 Other specified abnormal findings of blood chemistry: Secondary | ICD-10-CM | POA: Diagnosis not present

## 2016-01-17 DIAGNOSIS — F329 Major depressive disorder, single episode, unspecified: Secondary | ICD-10-CM | POA: Diagnosis not present

## 2016-01-17 DIAGNOSIS — F32A Depression, unspecified: Secondary | ICD-10-CM

## 2016-01-17 DIAGNOSIS — R531 Weakness: Secondary | ICD-10-CM | POA: Diagnosis not present

## 2016-01-17 DIAGNOSIS — R945 Abnormal results of liver function studies: Secondary | ICD-10-CM

## 2016-01-17 NOTE — Progress Notes (Signed)
This is a routine visit.  Level care skilled.  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  History of present illness.  Patient is a pleasant 70 year old male with the above diagnosis he is here for rehabilitation after hospital admission for weakness gait instability and elevated AST and left femoral head did not see.  In the hospital MRI did not show any acute process did show chronic infarcts.  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  X-ray of the right foot did show a fracture of the proximal fourth toe with slight impaction.  No surgical intervention was pursued secondary to orthopedic evaluation.  Patient does work with therapy here although according patient at times he does refuse this.  And an MRI did show a possible avascular necrosis of the left femoral head small tear of the anterior acetabular labrum  --- orthopedic consult was arranged actually for this morning however patient opted not to go saying he just did not feel up to it appears-although talking with him this morning he is open to rescheduling this  I did tell him that we really need to evaluate this hip situation and he did express understanding  At times he complains of constipation however speaking with nursing this is intermittent and at times will complain of diarrhea as well they do not report consistent diarrhea or constipation but this will have to be watched.  Currently he is resting in bed comfortably-- Vital signs appear to be stable  Past Medical History:  Diagnosis Date  . Depression   . HCV (hepatitis C virus)   . Headache    . Hyperlipidemia   . Stroke [facility])         Past Surgical History:  Procedure Laterality Date  . CHOLECYSTECTOMY    . FOOT SURGERY     with metal brace in but  not any more   worn  brace on rt foot for 6 yrs         Allergies  Allergen Reactions  . Codeine Nausea And Vomiting and Other (See Comments)    Sick on my stomach  . Sulfa Antibiotics Nausea And Vomiting and Other (See Comments)    sick          Medication List               alendronate 70 MG tablet Commonly known as:  FOSAMAX Take 70 mg by mouth once a week. Take with a full glass of water on an empty stomach.  aspirin 81 MG EC tablet Take 1 tablet (81 mg total) by mouth daily.  aspirin-acetaminophen-caffeine 250-250-65 MG tablet Commonly known as:  EXCEDRIN MIGRAINE Take 2 tablets by mouth every 6 (six) hours as needed for headache.  diazepam 5 MG tablet Commonly known as:  VALIUM Take 1 tablet (5 mg total) by mouth daily.  Enema 7-19 GM/118ML Enem Place 118 mLs rectally daily as needed.  escitalopram 5 MG tablet Commonly known as:  LEXAPRO Take 10 mg by mouth daily.  oxyCODONE-acetaminophen 5-325 MG tablet Commonly known as:  PERCOCET/ROXICET Take 1 tablet by mouth every 6 (six) hours as needed (hip/low back pain).  polyethylene glycol packet Commonly known as:  MIRALAX / GLYCOLAX Take 17 g by mouth  daily.  saccharomyces boulardii 250 MG capsule Commonly known as:  FLORASTOR Take 250 mg by mouth daily.      Review of Systems   .  In general does not complaining of any fever or chills.   Skin does not complain of increased bruising or rashes  . Ears eyes nose mouth and throat does not complain of acute visual changes has prescription lenses does not complain of sore throat.  Respiratory does not complain of shortness breath or cough.  Cardiac no chest pain very minimal lower extremity edema does have some edema of his right hand--with history of  hemiparesis  GI is not complaining of abdominal discomfort nausea vomiting diarrhea -does complain of constipation at times  Muscle skeletal  At times will complain of leg and hip discomfort although this appears relatively well controlled presently on current pain medication.  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  Psych-does not complain of overt anxiety or depression but does at times apparently appear frustrated--said he felt a bit down this morning but appears to be in better spirits now --- says he does not really feel significantly depressed   There is no immunization history on file for this patient.     Pertinent  Health Maintenance Due  Topic Date Due  . INFLUENZA VACCINE  10/11/2015  . PNA vac Low Risk Adult (1 of 2 - PCV13) 12/29/2016 (Originally 05/28/2009)  . COLONOSCOPY  07/09/2016   No flowsheet data found. Functional Status Survey:  Physical exam.  T- 98.8 pulse 76 respirations 18 blood pressure recently 114/57-142/76-weight appears to be relatively stable at 155.2     Physical Exam Constitutional: He is oriented to person, place, and time. He appears well-developedand well-nourished.  Frail appearing in NAD sitting up in bed HENT:  Mouth/Throat: Oropharynx is clear and moist.  Eyes: Visual acuity appears grossly intact he has prescription lenses   .  Cardiovascular: Normal rate, regular rhythm, normal heart soundsand intact distal pulses. Exam reveals no gallopand no friction rub.  No murmurheard.  scant LE swelling. More so on the right No calf TTP Pulmonary/Chest: Effort normaland breath sounds normal. He has no wheezes. He has no rales. He exhibits no tenderness.  Abdominal: Soft. Bowel sounds are normal. He exhibits no distension, no abdominal bruit, no pulsatile midline massand no mass. There is no hepatomegaly. There is no tenderness. There is no reboundand no  guarding.  Musculoskeletal: He exhibits edema( rIGHT UPPER EXTREMITY.  RUE contracture - flexion at wrist  .  Neurological: He appears largely alert and oriented although appears to have somecognitive  deficits.  Right hemiplegia and right hemiparesis with right foot drop Skin: Skin is warmand dry. No rashnoted.  Psychiatric:  he is largely pleasant and appropriate-appears to have some confusion at times dappeared to be in recently good spirits when I talked to him said he felt a bit down earlier today and dd notgo tohis orthopedic appointment but isis willing to reschedule    Labs reviewed:  Recent Labs (within last 365 days)   Recent Labs  12/13/15 1541 12/13/15 1556 12/15/15 0219 12/16/15 0549  NA 138 138 135 138  K 3.9 3.8 4.0 4.3  CL 107 106 105 107  CO2 19*  --  27 26  GLUCOSE 85 82 102* 91  BUN 6 6 8 8   CREATININE 0.69 0.70 0.69 0.70  CALCIUM 8.9  --  8.6* 8.6*  Recent Labs (within last 365 days)   Recent Labs  12/13/15 1541  AST 67*  ALT 31  ALKPHOS 120  BILITOT 3.2*  PROT 7.9  ALBUMIN 3.2*      Recent Labs (within last 365 days)   Recent Labs  12/13/15 1541 12/13/15 1556 12/15/15 0219  WBC 7.9  --  5.6  NEUTROABS 3.8  --   --   HGB 15.0 16.7 12.3*  HCT 45.6 49.0 38.8*  MCV 100.0  --  100.5*  PLT 134*  --  124*     Recent Labs       Lab Results  Component Value Date   TSH 2.523 12/14/2015     Recent Labs  No results found for: HGBA1C   Recent Labs       Lab Results  Component Value Date   CHOL 126 12/14/2015   HDL 31 (L) 12/14/2015   LDLCALC 85 12/14/2015   TRIG 49 12/14/2015   CHOLHDL 4.1 12/14/2015      Significant Diagnostic Results in last 30 days:   Imaging Results    Assessment and plan.  History of weakness thought to be multifactorial with history of CVA with right-sided hemiparalysis-also hip pain-with questionable avascular necalso receives Percocet as well as Excedrin as needed  for pain.  He does have orthopedic follow-up scheduled unfortunately he did not go this morning but is willing to have this rescheduled I feel this is quite important  did discuss this with him  Also encouraged to have therapy which apparently he refuses at times.  #2 history of proximal right fourth toe fracture again he willneed orthopedic follow-up clinically appears to be stable in this regards.  #3 history chronic hepatitis C untreated  He is followed by Dr. Cecil CobbsPatterson-I do note he had elevated bilirubin on lab last month of 3.2 as well as a AST 67 will update liver function tests he is not complaining of any abdominal pain or discomfort at this time.  #4 history of CVA he continues on aspirin with right-sided hemiparesis.  #5 history of depression he is on Lexapro 10 mg a day says he felt a bit down this morning but feeling better now this will have to batched he does not really want aggressive psychtric follow-up.  #6 history of anxiety he continues on Valium this appears to be relatively stable.  #7 history thrombocytopenia with platelets of 124,000 on lab done last month will update this as well.  #8-hypertension?-Blood pressures appear to be stable reading today was 142/76 at this point will monitor it does not appear the systolic elevations are consistent.  #9 chronic pain syndrome-as noted above this appears to be relatively well-controlled-I feel he would benefit from orthopedic follow-up  [id number]-of note greater than 40 minutes spent assessing patient-reviewing his chart-reviewing his labs-and coordinating and formulating a plan to care for numerous diagnoses-of note greater than 50% of time spent coordinating plan of care

## 2016-01-29 DIAGNOSIS — M87052 Idiopathic aseptic necrosis of left femur: Secondary | ICD-10-CM | POA: Insufficient documentation

## 2016-01-30 ENCOUNTER — Ambulatory Visit (INDEPENDENT_AMBULATORY_CARE_PROVIDER_SITE_OTHER): Payer: Medicare Other | Admitting: Orthopaedic Surgery

## 2016-01-30 ENCOUNTER — Encounter (INDEPENDENT_AMBULATORY_CARE_PROVIDER_SITE_OTHER): Payer: Self-pay | Admitting: Orthopaedic Surgery

## 2016-01-30 ENCOUNTER — Ambulatory Visit (INDEPENDENT_AMBULATORY_CARE_PROVIDER_SITE_OTHER): Payer: Medicare Other

## 2016-01-30 DIAGNOSIS — M25551 Pain in right hip: Secondary | ICD-10-CM

## 2016-01-30 NOTE — Progress Notes (Signed)
Office Visit Note   Patient: Neil Lambert           Date of Birth: 1946/02/07           MRN: [id number] Visit Date: 01/30/2016              Requested by: Jarome Matinaniel Paterson, MD [address] [place], [place] [id number] PCP: Garlan FillersPATERSON,DANIEL G, MD   Assessment & Plan: Visit Diagnoses:  1. Pain in right hip     Plan: Would recommend Voltaren gel and oral NSAIDs as needed. Patient is not a surgical candidate and certainly not symptomatic enough to warrant any sort of surgery. Follow-up as needed. Today's encounter was made more complex given the patient's difficulty with recounting events and details and poor historian.  Follow-Up Instructions: Return if symptoms worsen or fail to improve.   Orders:  Orders Placed This Encounter  Procedures  . XR Pelvis 1-2 Views   No orders of the defined types were placed in this encounter.     Procedures: No procedures performed   Clinical Data: No additional findings.   Subjective: Chief Complaint  Patient presents with  . Left Hip - Pain    Mr. Neil Lambert is a 70 year old gentleman who presents with chronic right hip pain and AVN found of the left femoral head on MRI. Details of the history of present illness for limited given the fact that the patient has a very poor historian. He does have a history of a stroke 33 years ago. He is currently doing physical therapy. He feels that the right hip is getting better. His pain is mainly laterally. He says that that he may have fallen 2 months ago when he had a bag groceries. He is minimally ambulatory. He mainly is in a wheelchair    Review of Systems  Unable to perform ROS: Other  Patient is a poor historian and has difficulty answering questions appropriately   Objective: Vital Signs: There were no vitals taken for this visit.  Physical Exam  Constitutional: He is oriented to person, place, and time. He appears well-developed and well-nourished.  HENT:  Head: Normocephalic and  atraumatic.  Eyes: EOM are normal.  Neck: Neck supple.  Cardiovascular: Intact distal pulses.   Pulmonary/Chest: Effort normal.  Abdominal: Soft.  Neurological: He is alert and oriented to person, place, and time.  Skin: Skin is warm.  Psychiatric: He has a normal mood and affect. His behavior is normal. Judgment and thought content normal.  Nursing note and vitals reviewed.   Ortho Exam Examination of the left hip shows that he has no pain with any range of motion. He has no pain with palpation. There are no skin changes. Right hip exam shows mild tenderness over the lateral hip. No pain with rotation of the hip. Unable to perform a central side weakness of the quadriceps and hip flexors. Specialty Comments:  No specialty comments available.  Imaging: Xr Pelvis 1-2 Views  Result Date: 01/30/2016 No significant degenerative joint disease of the right hip. 2 focal areas of AVN on x-ray of the left femoral head without any collapse or degenerative joint disease.    PMFS History: Patient Active Problem List   Diagnosis Date Noted  . Avascular necrosis of hip, left (HCC) 01/29/2016  . Weakness of extremity   . Gait instability   . Elevated AST (SGOT) 12/14/2015  . Depression 12/14/2015  . History of CVA (cerebrovascular accident) 12/14/2015  . Right hemiplegia (HCC)   . Right hip  pain   . Nondisplaced fracture of proximal phalanx of right lesser toe(s), initial encounter for closed fracture   . Generalized weakness 12/13/2015  . Hemiparesis (HCC) 12/15/2013  . Hepatitis C, chronic (HCC) 12/06/2010   Past Medical History:  Diagnosis Date  . Depression   . HCV (hepatitis C virus)   . Headache   . Hyperlipidemia   . Stroke [facility](HCC)     No family history on file.  Past Surgical History:  Procedure Laterality Date  . CHOLECYSTECTOMY    . FOOT SURGERY     with metal brace in but  not any more   worn  brace on rt foot for 6 yrs   Social History   Occupational History  .  Not on file.   Social History Main Topics  . Smoking status: Former Smoker    Types: Cigarettes  . Smokeless tobacco: Never Used  . Alcohol use 1.2 - 1.8 oz/week    2 - 3 Cans of beer per week     Comment: * packs a week  . Drug use: No  . Sexual activity: Yes

## 2016-02-01 ENCOUNTER — Telehealth (INDEPENDENT_AMBULATORY_CARE_PROVIDER_SITE_OTHER): Payer: Self-pay | Admitting: Orthopaedic Surgery

## 2016-02-01 NOTE — Telephone Encounter (Signed)
Facility called requesting that Dr. Roda ShuttersXu dictation from Monday be faxed so they can put in the order for Voltaren Gel and NSAIDS. Fax number [phone] attn Alcario DroughtErica

## 2016-02-08 NOTE — Telephone Encounter (Signed)
Faxed to [phone]

## 2016-02-14 ENCOUNTER — Encounter: Payer: Self-pay | Admitting: Adult Health

## 2016-02-14 ENCOUNTER — Non-Acute Institutional Stay (SKILLED_NURSING_FACILITY): Payer: Medicare Other | Admitting: Adult Health

## 2016-02-14 DIAGNOSIS — F05 Delirium due to known physiological condition: Secondary | ICD-10-CM | POA: Diagnosis not present

## 2016-02-14 DIAGNOSIS — R410 Disorientation, unspecified: Secondary | ICD-10-CM

## 2016-02-14 NOTE — Progress Notes (Signed)
Patient ID: Neil Lambert, male   DOB: 12-26-1945, 70 y.o.   MRN: [id number]   Location:   [facility] Room Number: 129-A Place of Service:  SNF (31)   CODE STATUS: DNR  Allergies  Allergen Reactions  . Codeine Nausea And Vomiting and Other (See Comments)    Sick on my stomach  . Sulfa Antibiotics Nausea And Vomiting and Other (See Comments)    sick    Chief Complaint  Patient presents with  . Acute Visit    HPI:    Past Medical History:  Diagnosis Date  . Depression   . HCV (hepatitis C virus)   . Headache   . Hyperlipidemia   . Stroke [facility](HCC)     Past Surgical History:  Procedure Laterality Date  . CHOLECYSTECTOMY    . FOOT SURGERY     with metal brace in but  not any more   worn  brace on rt foot for 6 yrs    Social History   Social History  . Marital status: Single    Spouse name: N/A  . Number of children: N/A  . Years of education: N/A   Occupational History  . Not on file.   Social History Main Topics  . Smoking status: Former Smoker    Types: Cigarettes  . Smokeless tobacco: Never Used  . Alcohol use 1.2 - 1.8 oz/week    2 - 3 Cans of beer per week     Comment: * packs a week  . Drug use: No  . Sexual activity: Yes   Other Topics Concern  . Not on file   Social History Narrative  . No narrative on file   History reviewed. No pertinent family history.    VITAL SIGNS BP 130/60   Pulse 91   Temp 98.1 F (36.7 C) (Oral)   Resp 20   Ht 5\' 9"  (1.753 m)   Wt 146 lb (66.2 kg)   SpO2 97%   BMI 21.56 kg/m   Patient's Medications  New Prescriptions   No medications on file  Previous Medications   ALENDRONATE (FOSAMAX) 70 MG TABLET    Take 70 mg by mouth once a week. Take with a full glass of water on an empty stomach.   ASPIRIN EC 81 MG EC TABLET    Take 1 tablet (81 mg total) by mouth daily.   ASPIRIN-ACETAMINOPHEN-CAFFEINE (EXCEDRIN MIGRAINE) 250-250-65 MG TABLET    Take 2 tablets by mouth every 6 (six) hours as  needed for headache.   DIAZEPAM (VALIUM) 5 MG TABLET    Take 1 tablet (5 mg total) by mouth daily.   ESCITALOPRAM (LEXAPRO) 10 MG TABLET    Take 10 mg by mouth daily.   OXYCODONE-ACETAMINOPHEN (PERCOCET/ROXICET) 5-325 MG TABLET    Take 1 tablet by mouth every 6 (six) hours as needed (hip/low back pain).   POLYETHYLENE GLYCOL (MIRALAX / GLYCOLAX) PACKET    Take 17 g by mouth daily.   SACCHAROMYCES BOULARDII (FLORASTOR) 250 MG CAPSULE    Take 250 mg by mouth daily.   SODIUM PHOSPHATES (ENEMA) 7-19 GM/118ML ENEM    Place 118 mLs rectally daily as needed.  Modified Medications   No medications on file  Discontinued Medications   ESCITALOPRAM (LEXAPRO) 5 MG TABLET    Take 10 mg by mouth daily.     SIGNIFICANT DIAGNOSTIC EXAMS  12-13-15: right ankle x-ray: Soft tissue swelling laterally. No fracture. Ankle mortise appears intact. Osteoarthritic change noted in  the talonavicular joint.  12-13-15: right foot x-ray: Acute fracture proximal aspect fourth proximal phalanx with slight impaction at the fracture site. No other acute fracture evident. Old trauma with remodeling involving the cuboid bone. No dislocation. No appreciable joint space narrowing.   12-13-15: pelvic x-ray: No fracture or dislocation. Mild symmetric narrowing of both hip joints. Note that on the pelvic image, there is a lucency in the lateral left femoral head, potentially a focus of avascular necrosis. Note that MR is the imaging study of choice to assess for avascular necrosis.  12-14-15: MRI of brain: 1.  No acute intracranial abnormality. 2. 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 3. 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  12-15-15: MRI of cervical spine: Normal appearing cervical cord. No finding to explain the patient's symptoms.  Multilevel spondylosis predominantly results in foraminal narrowing as  detailed above. 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 Please see above for descriptions of individual levels. Normal appearing cervical cord. No finding to explain the patient's symptoms. 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 Please see above for descriptions of individual levels.   12-29-15: MRI of left hip 1. 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 There is internal enhancement in a trace amount of surrounding marrow edema. 2. Mild-to-moderate degenerative findings in both hips. 3. Small tear of the anterior superior acetabular labrum on the left. 4. There is some patchy muscular edema especially on the right side involving the quadriceps, gluteus minimus, and gluteus medius. This is nonspecific but could reflect a low-grade myositis or muscular strains. Some of this edema tracks deep to the right iliotibial band. 5. Marrow heterogeneity is present. Although this can be caused by marrow infiltrative processes, the most common causes include anemia, smoking, obesity, or advancing age.    LABS REVIEWED:   12-14-15: vit B 12: 716; folate 14.3; tsh 2.523; RPR; nr; chol 126; ldl 85; trig 49; hdl 31 12-15-15: wbc 5.6; hgb 12.3; hct 38.8; mcv 100.5; plt 124; glucose 102; bun 8; creat 0.69; k+ 4.0; na++ 135    Review of Systems  Constitutional: Negative for malaise/fatigue.  Respiratory: Negative for cough and shortness of breath.   Cardiovascular: Negative for chest pain, palpitations and leg swelling.  Gastrointestinal: Negative for abdominal pain, constipation and heartburn.  Musculoskeletal: negative joint pain. Negative for back pain and myalgias.   Skin: Negative.   Neurological: Negative for dizziness.  Psychiatric/Behavioral: he is slightly disoriented; and is paranoid about being robbed.     Physical Exam  Constitutional: He is oriented to person, place, and time.    Eyes: Conjunctivae are normal.  Neck: Neck supple. No JVD present. No thyromegaly present.  Cardiovascular: Normal rate, regular rhythm and intact distal pulses.   Respiratory: Effort normal and breath sounds normal. No respiratory distress. He has no wheezes.  GI: Soft. Bowel sounds are normal. He exhibits no distension. There is no tenderness.  Musculoskeletal: He exhibits no edema.  Has right hemiparesis   Lymphadenopathy:    He has no cervical adenopathy.  Neurological: He is alert and oriented to person, place, and time.  Skin: Skin is warm and dry. He is not diaphoretic.  Psychiatric: he is upset and worried     ASSESSMENT/ PLAN:  1. Confusion with paranoia; the nursing staff has not noticed  a change in his behaviors. His family has notcied some changes; will check cbc; cmp to look for possible infection and electrolyte disturbance.    MD is aware of resident's narcotic use and is in agreement with current plan of care. We will attempt to wean resident as apropriate   Synthia Innocenteborah Alayna Mabe NP [facility]  Contact [phone] Monday through Friday 8am- 5pm  After hours call [phone]

## 2016-02-15 LAB — BASIC METABOLIC PANEL
BUN: 9 mg/dL (ref 4–21)
CREATININE: 0.6 mg/dL (ref <=1.3)
Glucose: 95 mg/dL
Potassium: 4.4 mmol/L (ref 3.4–5.3)
Sodium: 136 mmol/L — AB (ref 137–147)

## 2016-02-15 LAB — CBC AND DIFFERENTIAL
HCT: 39 % — AB (ref 41–53)
HEMOGLOBIN: 12.7 g/dL — AB (ref 13.5–17.5)
NEUTROS ABS: 35 /uL
PLATELETS: 133 10*3/uL — AB (ref 150–399)
WBC: 7.6 10^3/mL

## 2016-02-15 LAB — HEPATIC FUNCTION PANEL
ALK PHOS: 97 U/L (ref 25–125)
ALT: 18 U/L (ref 10–40)
AST: 45 U/L — AB (ref 14–40)
Bilirubin, Total: 1.7 mg/dL

## 2016-02-17 ENCOUNTER — Encounter: Payer: Self-pay | Admitting: Adult Health

## 2016-02-17 ENCOUNTER — Non-Acute Institutional Stay (SKILLED_NURSING_FACILITY): Payer: Medicare Other | Admitting: Adult Health

## 2016-02-17 DIAGNOSIS — F329 Major depressive disorder, single episode, unspecified: Secondary | ICD-10-CM

## 2016-02-17 DIAGNOSIS — M87052 Idiopathic aseptic necrosis of left femur: Secondary | ICD-10-CM | POA: Diagnosis not present

## 2016-02-17 DIAGNOSIS — B182 Chronic viral hepatitis C: Secondary | ICD-10-CM

## 2016-02-17 DIAGNOSIS — R2681 Unsteadiness on feet: Secondary | ICD-10-CM | POA: Diagnosis not present

## 2016-02-17 DIAGNOSIS — R29898 Other symptoms and signs involving the musculoskeletal system: Secondary | ICD-10-CM | POA: Diagnosis not present

## 2016-02-17 DIAGNOSIS — Z8673 Personal history of transient ischemic attack (TIA), and cerebral infarction without residual deficits: Secondary | ICD-10-CM | POA: Diagnosis not present

## 2016-02-17 DIAGNOSIS — G8191 Hemiplegia, unspecified affecting right dominant side: Secondary | ICD-10-CM | POA: Diagnosis not present

## 2016-02-17 DIAGNOSIS — F32A Depression, unspecified: Secondary | ICD-10-CM

## 2016-02-17 NOTE — Progress Notes (Signed)
Patient ID: Neil Lambert, male   DOB: 1945/07/14, 70 y.o.   MRN: [id number]   Location:   [facility] Room Number: 129-A Place of Service:  SNF (31)   CODE STATUS: DNR  Allergies  Allergen Reactions  . Codeine Nausea And Vomiting and Other (See Comments)    Sick on my stomach  . Sulfa Antibiotics Nausea And Vomiting and Other (See Comments)    sick    Chief Complaint  Patient presents with  . Medical Management of Chronic Issues    follow up    HPI:  He is a resident of this facility being seen for the management of his chronic illnesses. Overall there is little change in his status. He continues to participate with therapy. He is not voicing any complaints today. There are no nursing concerns at this time.    Past Medical History:  Diagnosis Date  . Depression   . HCV (hepatitis C virus)   . Headache   . Hyperlipidemia   . Stroke [facility](HCC)     Past Surgical History:  Procedure Laterality Date  . CHOLECYSTECTOMY    . FOOT SURGERY     with metal brace in but  not any more   worn  brace on rt foot for 6 yrs    Social History   Social History  . Marital status: Single    Spouse name: N/A  . Number of children: N/A  . Years of education: N/A   Occupational History  . Not on file.   Social History Main Topics  . Smoking status: Former Smoker    Types: Cigarettes  . Smokeless tobacco: Never Used  . Alcohol use 1.2 - 1.8 oz/week    2 - 3 Cans of beer per week     Comment: * packs a week  . Drug use: No  . Sexual activity: Yes   Other Topics Concern  . Not on file   Social History Narrative  . No narrative on file   History reviewed. No pertinent family history.    VITAL SIGNS BP 110/70   Pulse 80   Temp 97.6 F (36.4 C) (Oral)   Resp 20   Ht 5\' 9"  (1.753 m)   Wt 146 lb (66.2 kg)   SpO2 98%   BMI 21.56 kg/m   Patient's Medications  New Prescriptions   No medications on file  Previous Medications   ALENDRONATE (FOSAMAX) 70 MG  TABLET    Take 70 mg by mouth once a week. Take with a full glass of water on an empty stomach.   ASPIRIN EC 81 MG EC TABLET    Take 1 tablet (81 mg total) by mouth daily.   ASPIRIN-ACETAMINOPHEN-CAFFEINE (EXCEDRIN MIGRAINE) 250-250-65 MG TABLET    Take 2 tablets by mouth every 6 (six) hours as needed for headache.   DIAZEPAM (VALIUM) 5 MG TABLET    Take 1 tablet (5 mg total) by mouth daily.   ESCITALOPRAM (LEXAPRO) 10 MG TABLET    Take 10 mg by mouth daily.   OXYCODONE-ACETAMINOPHEN (PERCOCET/ROXICET) 5-325 MG TABLET    Take 1 tablet by mouth every 6 (six) hours as needed (hip/low back pain).   POLYETHYLENE GLYCOL (MIRALAX / GLYCOLAX) PACKET    Take 17 g by mouth daily.   SACCHAROMYCES BOULARDII (FLORASTOR) 250 MG CAPSULE    Take 250 mg by mouth daily.   SODIUM PHOSPHATES (ENEMA) 7-19 GM/118ML ENEM    Place 118 mLs rectally daily as needed.  Modified Medications   No medications on file  Discontinued Medications   No medications on file     SIGNIFICANT DIAGNOSTIC EXAMS  12-13-15: right ankle x-ray: Soft tissue swelling laterally. No fracture. Ankle mortise appears intact. 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  12-13-15: right foot x-ray: Acute fracture proximal aspect fourth proximal phalanx with slight impaction at the fracture site. No other acute fracture evident. Old trauma with remodeling involving the cuboid bone. No dislocation. No appreciable joint space narrowing.   12-13-15: pelvic x-ray: No fracture or dislocation. Mild symmetric narrowing of both hip joints. Note that on the pelvic image, there is a lucency in the lateral left femoral head, potentially a focus of avascular necrosis. Note that MR is the imaging study of choice to assess for avascular necrosis.  12-14-15: MRI of brain: 1.  No acute intracranial abnormality. 2. 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 3. Small 2 cm right anterior parasagittal calcified meningioma, not  significantly changed since 2008 and most likely clinically silent.  12-15-15: MRI of cervical spine: Normal appearing cervical cord. No finding to explain the patient's symptoms.  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 Please see above for descriptions of individual levels. Normal appearing cervical cord. No finding to explain the patient's symptoms. 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 Please see above for descriptions of individual levels.   12-29-15: MRI of left hip 1. 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 There is internal enhancement in a trace amount of surrounding marrow edema. 2. Mild-to-moderate degenerative findings in both hips. 3. Small tear of the anterior superior acetabular labrum on the left. 4. There is some patchy muscular edema especially on the right side involving the quadriceps, gluteus minimus, and gluteus medius. This is nonspecific but could reflect a low-grade myositis or muscular strains. Some of this edema tracks deep to the right iliotibial band. 5. Marrow heterogeneity is present. Although this can be caused by marrow infiltrative processes, the most common causes include anemia, smoking, obesity, or advancing age.    LABS REVIEWED:   12-14-15: vit B 12: 716; folate 14.3; tsh 2.523; RPR; nr; chol 126; ldl 85; trig 49; hdl 31 12-15-15: wbc 5.6; hgb 12.3; hct 38.8; mcv 100.5; plt 124; glucose 102; bun 8; creat 0.69; k+ 4.0; na++ 135    Review of Systems  Constitutional: Negative for malaise/fatigue.  Respiratory: Negative for cough and shortness of breath.   Cardiovascular: Negative for chest pain, palpitations and leg swelling.  Gastrointestinal: Negative for abdominal pain, constipation and heartburn.  Musculoskeletal: Positive for joint pain. Negative  for back pain and myalgias.       Left hip pain   Skin: Negative.   Neurological: Negative for dizziness.  Psychiatric/Behavioral: The patient is not nervous/anxious.     Physical Exam  Constitutional: He is oriented to person, place, and time. No distress.  Eyes: Conjunctivae are normal.  Neck: Neck supple. No JVD present. No thyromegaly present.  Cardiovascular: Normal rate, regular rhythm and intact distal pulses.   Respiratory: Effort normal and breath sounds normal. No respiratory distress. He has no wheezes.  GI: Soft. Bowel sounds are normal. He exhibits no distension. There is no tenderness.  Musculoskeletal: He exhibits no edema.  Has right hemiparesis   Lymphadenopathy:    He has no  cervical adenopathy.  Neurological: He is alert and oriented to person, place, and time.  Speech is slurred Skin: Skin is warm and dry. He is not diaphoretic.  Psychiatric: He has a normal mood and affect.     ASSESSMENT/ PLAN:  1. Left hip avascular necrosis: 1.5 x 2.4 x 0.8 cm on left hip on 12-29-15 .  Will continue  fosamax 70 mg weekly and will monitor  2. 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  Chronic hepatitis C: will continue to monitor   4. Depression: will continue lexapro 10 mg daily   5. Constipation: will continue miralax daily   6. Gait instability and weakness: will continue therapy as directed and will monitor    Time spent with patient  30  minutes >50% time spent counseling; reviewing medical record; tests; labs; and developing future plan of care          MD is aware of resident's narcotic use and is in agreement with current plan of care. We will attempt to wean resident as apropriate   Synthia Innocent NP [facility]  Contact [phone] Monday through Friday 8am- 5pm  After hours call [phone]

## 2016-02-22 LAB — VITAMIN B12: VITAMIN B 12: 1037

## 2016-03-21 ENCOUNTER — Non-Acute Institutional Stay (SKILLED_NURSING_FACILITY): Payer: Medicare Other | Admitting: Internal Medicine

## 2016-03-21 DIAGNOSIS — M87052 Idiopathic aseptic necrosis of left femur: Secondary | ICD-10-CM

## 2016-03-21 DIAGNOSIS — F329 Major depressive disorder, single episode, unspecified: Secondary | ICD-10-CM | POA: Diagnosis not present

## 2016-03-21 DIAGNOSIS — F32A Depression, unspecified: Secondary | ICD-10-CM

## 2016-03-21 DIAGNOSIS — Z8673 Personal history of transient ischemic attack (TIA), and cerebral infarction without residual deficits: Secondary | ICD-10-CM

## 2016-03-21 DIAGNOSIS — B182 Chronic viral hepatitis C: Secondary | ICD-10-CM

## 2016-03-21 DIAGNOSIS — G8191 Hemiplegia, unspecified affecting right dominant side: Secondary | ICD-10-CM

## 2016-03-21 NOTE — Progress Notes (Signed)
This is a routine visit.  Level of care skilled.  Facility is [facility]  Chief complaint-medical management of chronic medical issues--   Today he is still somewhat agitated fact would only allow limited exam.  Vital signs appear to be stable his weight appears to be stable he actually appeared to have a fairly good appetite when I was in the room he was eating his lunch.  Regards to left hip avascular necrosis he is on Fosamax.  Pain management continues on OxyIR 5 mg every 8 hours when necessary.  For muscle spasms he is on diazepam 5 mg twice a day as well.      Past Medical History:  Diagnosis Date  . Depression   . HCV (hepatitis C virus)   . Headache   . Hyperlipidemia   . Stroke [facility](HCC)          Past Surgical History:  Procedure Laterality Date  . CHOLECYSTECTOMY    . FOOT SURGERY     with metal brace in but  not any more   worn  brace on rt foot for 6 yrs    Social History        Social History  . Marital status: Single    Spouse name: N/A  . Number of children: N/A  . Years of education: N/A      Occupational History  . Not on file.         Social History Main Topics  . Smoking status: Former Smoker    Types: Cigarettes  . Smokeless tobacco: Never Used  . Alcohol use 1.2 - 1.8 oz/week    2 - 3 Cans of beer per week     Comment: * packs a week  . Drug use: No  . Sexual activity: Yes       Other Topics Concern  . Not on file      Social History Narrative  . No narrative on file   History reviewed. No pertinent family history.    VITAL SIGNS  Temperature is 97.6 pulse 76 respirations 20 blood pressure 134/70-weight is stable at 147 pounds      Patient's Medications  New Prescriptions   No medications on file  Previous Medications   ALENDRONATE (FOSAMAX) 70 MG TABLET    Take 70 mg by mouth once a week. Take with a full glass of water on an empty stomach.   ASPIRIN EC 81 MG EC  TABLET    Take 1 tablet (81 mg total) by mouth daily.   ASPIRIN-ACETAMINOPHEN-CAFFEINE (EXCEDRIN MIGRAINE) 250-250-65 MG TABLET    Take 2 tablets by mouth every 6 (six) hours as needed for headache.   DIAZEPAM (VALIUM) 5 MG TABLET    Take 1 tablet (5 mg total) by mouth daily.   ESCITALOPRAM (LEXAPRO) 10 MG TABLET    Take 10 mg by mouth daily.   OXYCODONE-ACETAMINOPHEN (PERCOCET/ROXICET) 5-325 MG TABLET    Take 1 tablet by mouth every 6 (six) hours as needed (hip/low back pain).   POLYETHYLENE GLYCOL (MIRALAX / GLYCOLAX) PACKET    Take 17 g by mouth daily.   SACCHAROMYCES BOULARDII (FLORASTOR) 250 MG CAPSULE    Take 250 mg by mouth daily.   SODIUM PHOSPHATES (ENEMA) 7-19 GM/118ML ENEM    Place 118 mLs rectally daily as needed.  Modified Medications   No medications on file  Discontinued Medications   No medications on file  OF NOTE---he is now on Seroquel 100 mg daily at  bedtime   SIGNIFICANT DIAGNOSTIC EXAMS  12-13-15: right ankle x-ray: Soft tissue swelling laterally. No fracture. Ankle mortise appears intact. 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  12-13-15: right foot x-ray: Acute fracture proximal aspect fourth proximal phalanx with slight impaction at the fracture site. No other acute fracture evident. Old trauma with remodeling involving the cuboid bone. No dislocation. No appreciable joint space narrowing.  12-13-15: pelvic x-ray: No fracture or dislocation. Mild symmetric narrowing of both hip joints. Note that on the pelvic image, there is a lucency in the lateral left femoral head, potentially a focus of avascular necrosis. Note that MR is the imaging study of choice to assess for avascular necrosis.  12-14-15: MRI of brain: 1. No acute intracranial abnormality. 2. 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 3. Small 2 cm right anterior parasagittal calcified meningioma, not significantly changed since 2008 and most likely  clinically silent.  12-15-15: MRI of cervical spine: Normal appearing cervical cord. No finding to explain the patient's symptoms. 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 Please see above for descriptions of individual levels. Normal appearing cervical cord. No finding to explain the patient's symptoms. 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 Please see above for descriptions of individual levels.  12-29-15: MRI of left hip 1. 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 There is internal enhancement in a trace amount of surrounding marrow edema. 2. Mild-to-moderate degenerative findings in both hips. 3. Small tear of the anterior superior acetabular labrum on the left. 4. There is some patchy muscular edema especially on the right side involving the quadriceps, gluteus minimus, and gluteus medius. This is nonspecific but could reflect a low-grade myositis or muscular strains. Some of this edema tracks deep to the right iliotibial band. 5. Marrow heterogeneity is present. Although this can be caused by marrow infiltrative processes, the most common causes include anemia, smoking, obesity, or advancing age.    LABS REVIEWED:   12-14-15: vit B 12: 716; folate 14.3; tsh 2.523; RPR; nr; chol 126; ldl 85; trig 49; hdl 31 12-15-15: wbc 5.6; hgb 12.3; hct 38.8; mcv 100.5; plt 124; glucose 102; bun 8; creat 0.69; k+ 4.0; na++ 135   02/22/2016.  B12 level is 1037.  Folic acid is greater than 20.  02/15/2016.  Sodium 136 potassium 4.4 BUN 8.7 creatinine 0.59.  WBC 7.6 hemoglobin 12.5 platelets 133.  AST 45-total bilirubin 1.7-otherwise liver function tests within normal limits.     Review of Systems   This is limited secondary to patient being agitated with exam with limited  conversation-he is not really complaining of any significant pain however or discomfort although again he is somewhat agitated with any attempt to converse   Physical Exam --Would only allow a limited exam  Elderly male who looks younger than his stated age. The resting in bed comfortably but is somewhat agitated with any attempt to talk with him or examine.  His skin is warm and dry.  Chest is clear to auscultation although limited exam.  Heart is regular rate and rhythm without murmur gallop or rub.  He does not have significant edema.  Neurologic continues with right-sided hemiparalysis.  Further exam was unattainable    t.     ASSESSMENT/ PLAN:  1. Left hip avascular necrosis: Will continue  fosamax 70  mg weekly and will monitor  2. 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  Chronic hepatitis C: will continue to monitor   4. Depression: will continue lexapro 10 mg daily--he is followed by psychiatric services and has been started recently on Seroquel 100 mg daily at bedtime this will need close monitoring-again he was somewhat agitated with exam today and apparently these episodes have increased recently --apparently secondary to some changes in his personal life   5. Constipation: will continue miralax daily   6. Gait instability and weakness: will continue therapy as directed and will monitor --. Per pain management he is on OxyIR 5 mg every 8 hours when necessar y. #7 Also has Excedrin Migraine for intermittent headaches  Also since he is on Seroquel well update hemoglobin A1c.  #8 I do note bilirubin has been somewhat elevated last couple labs will update a CMP to keep an eye on this. He does have a history of chronic hepatitis C  #9 I do note he has a history of mild thrombocytopenia with platelets 133,000 on lab done in early December 2017 will update a CBC as  well  .  [id number]

## 2016-03-22 ENCOUNTER — Emergency Department (HOSPITAL_COMMUNITY): Payer: Medicare Other

## 2016-03-22 ENCOUNTER — Encounter (HOSPITAL_COMMUNITY): Payer: Self-pay | Admitting: Emergency Medicine

## 2016-03-22 ENCOUNTER — Emergency Department (HOSPITAL_COMMUNITY)
Admission: EM | Admit: 2016-03-22 | Discharge: 2016-03-23 | Disposition: A | Discharge status: Other Acute Inpatient Hospital | Payer: Medicare Other | Attending: Emergency Medicine | Admitting: Emergency Medicine

## 2016-03-22 DIAGNOSIS — X788XXA Intentional self-harm by other sharp object, initial encounter: Secondary | ICD-10-CM | POA: Insufficient documentation

## 2016-03-22 DIAGNOSIS — Z79899 Other long term (current) drug therapy: Secondary | ICD-10-CM | POA: Insufficient documentation

## 2016-03-22 DIAGNOSIS — Y929 Unspecified place or not applicable: Secondary | ICD-10-CM | POA: Diagnosis not present

## 2016-03-22 DIAGNOSIS — Z7982 Long term (current) use of aspirin: Secondary | ICD-10-CM | POA: Insufficient documentation

## 2016-03-22 DIAGNOSIS — Z87891 Personal history of nicotine dependence: Secondary | ICD-10-CM | POA: Diagnosis not present

## 2016-03-22 DIAGNOSIS — Z8673 Personal history of transient ischemic attack (TIA), and cerebral infarction without residual deficits: Secondary | ICD-10-CM | POA: Insufficient documentation

## 2016-03-22 DIAGNOSIS — Z7289 Other problems related to lifestyle: Secondary | ICD-10-CM

## 2016-03-22 DIAGNOSIS — S1191XA Laceration without foreign body of unspecified part of neck, initial encounter: Secondary | ICD-10-CM | POA: Insufficient documentation

## 2016-03-22 DIAGNOSIS — Y999 Unspecified external cause status: Secondary | ICD-10-CM | POA: Diagnosis not present

## 2016-03-22 DIAGNOSIS — Y939 Activity, unspecified: Secondary | ICD-10-CM | POA: Diagnosis not present

## 2016-03-22 DIAGNOSIS — F332 Major depressive disorder, recurrent severe without psychotic features: Secondary | ICD-10-CM | POA: Insufficient documentation

## 2016-03-22 DIAGNOSIS — IMO0002 Reserved for concepts with insufficient information to code with codable children: Secondary | ICD-10-CM

## 2016-03-22 DIAGNOSIS — Z049 Encounter for examination and observation for unspecified reason: Secondary | ICD-10-CM

## 2016-03-22 LAB — CBC
HEMATOCRIT: 36.4 % — AB (ref 39.0–52.0)
Hemoglobin: 12.3 g/dL — ABNORMAL LOW (ref 13.0–17.0)
MCH: 31.4 pg (ref 26.0–34.0)
MCHC: 33.8 g/dL (ref 30.0–36.0)
MCV: 92.9 fL (ref 78.0–100.0)
PLATELETS: 140 10*3/uL — AB (ref 150–400)
RBC: 3.92 MIL/uL — AB (ref 4.22–5.81)
RDW: 15 % (ref 11.5–15.5)
WBC: 7.6 10*3/uL (ref 4.0–10.5)

## 2016-03-22 LAB — COMPREHENSIVE METABOLIC PANEL
ALBUMIN: 2.7 g/dL — AB (ref 3.5–5.0)
ALT: 40 U/L (ref 17–63)
ANION GAP: 3 — AB (ref 5–15)
AST: 66 U/L — AB (ref 15–41)
Alkaline Phosphatase: 98 U/L (ref 38–126)
BUN: 7 mg/dL (ref 6–20)
CHLORIDE: 108 mmol/L (ref 101–111)
CO2: 24 mmol/L (ref 22–32)
Calcium: 8.3 mg/dL — ABNORMAL LOW (ref 8.9–10.3)
Creatinine, Ser: 0.68 mg/dL (ref 0.61–1.24)
GFR calc Af Amer: >60 mL/min (ref >60)
GFR calc non Af Amer: >60 mL/min (ref >60)
Glucose, Bld: 99 mg/dL (ref 65–99)
POTASSIUM: 3.5 mmol/L (ref 3.5–5.1)
SODIUM: 135 mmol/L (ref 135–145)
TOTAL PROTEIN: 6.4 g/dL — AB (ref 6.5–8.1)
Total Bilirubin: 1.5 mg/dL — ABNORMAL HIGH (ref 0.3–1.2)

## 2016-03-22 LAB — SALICYLATE LEVEL: Salicylate Lvl: <7 mg/dL (ref 2.8–30.0)

## 2016-03-22 LAB — URINALYSIS, COMPLETE (UACMP) WITH MICROSCOPIC
BACTERIA UA: NONE SEEN
Bilirubin Urine: NEGATIVE
Glucose, UA: NEGATIVE mg/dL
Hgb urine dipstick: NEGATIVE
KETONES UR: NEGATIVE mg/dL
LEUKOCYTES UA: NEGATIVE
Nitrite: NEGATIVE
Protein, ur: NEGATIVE mg/dL
SQUAMOUS EPITHELIAL / LPF: NONE SEEN
Specific Gravity, Urine: 1.012 (ref 1.005–1.030)
pH: 7 (ref 5.0–8.0)

## 2016-03-22 LAB — RAPID URINE DRUG SCREEN, HOSP PERFORMED
AMPHETAMINES: NOT DETECTED
BENZODIAZEPINES: POSITIVE — AB
Barbiturates: NOT DETECTED
COCAINE: NOT DETECTED
Opiates: NOT DETECTED
Tetrahydrocannabinol: NOT DETECTED

## 2016-03-22 LAB — ETHANOL: Alcohol, Ethyl (B): <5 mg/dL (ref <5)

## 2016-03-22 LAB — ACETAMINOPHEN LEVEL

## 2016-03-22 LAB — TSH: TSH: 2.029 u[IU]/mL (ref 0.350–4.500)

## 2016-03-22 NOTE — ED Notes (Signed)
PT states he does not want to answer any questions regarding his behavior before he came to ER

## 2016-03-22 NOTE — Progress Notes (Signed)
[facility] stated have not received report, and need custody order/complte IVC paper faxed to ( [phone](336) [phone]. Call to report is [phone]. Kallon Caylor K. Sherlon HandingHarris, LCAS-A, LPC-A, [facility]  Counselor 03/22/2016 7:46 PM

## 2016-03-22 NOTE — ED Notes (Signed)
Pt changed into maroon scrubs and wanded by security 

## 2016-03-22 NOTE — ED Triage Notes (Signed)
Pt BIB EMS from [facility] for SI; pt attempted to cut his throat with a pair of dull scissors; superficial injury with minimal bleeding present to anterior neck; Medication was changed to Seroquel in December and pt has been having worsening depression symptoms since then; A&Ox4

## 2016-03-22 NOTE — BH Assessment (Addendum)
Tele Assessment Note   Neil Lambert is an 71 y.o. male brought voluntarily into the [facility] tonight after attempting to cut his throat with dull scissors. Pt sts he was not intending to kill himself. Pt denies HI, SHI and AVH. Pt sts he has felt depressed recently, but never has had SI. Pt sts his life has no purpose and is meaningless. Pt sts his actions tonight were impulsive. Pt lives at [facility] and has been there for about 90 days per his estimation. Prior to moving to the nursing facility, pt was living alone and caring for himself with the help of his girlfriend. Pt has a hx of CVAs, Hepatitis C and Necrosis of the left hip. Pt sts he began to fall on a regular basis and ould not continue to live alone. Per pt record, pt has gait instability, hemiparesis and generalized weakness. Pt sts he has been undergoing PT, OT and ST to improve his independence. Pt sts he is felling stress about his declining health and continuing physical struggles and in October, 2017, his GF broke up with him, leaving him he sts for another man. Pt sts he is a former cigaraette smoker and before entering the nursing hom drank about 2-3 beers per week. Pt sts he has been previously psychiatrically hospitalized about 8-9 years ago for depression. Pt sts he has seen OPT about 15 years ago. Pt's symptoms of depression including sadness, fatigue, decreased self esteem, self isolation, lack of motivation for activities and pleasure, irritability, negative outlook, difficulty thinking & concentrating, feeling helpless and hopeless, sleep and eating disturbances. Pt denies symptoms of anxiety. Pt denies any legal hx and nay hx of abuse.   Pt was dressed in scrubs.. Pt was alert,irritable and uncooperative at first, but as the assessment progressed he became more pleasant. Pt kept fair eye contact, spoke in a slurred tone and at a normal pace. Pt at times seemed confused and had trouble selecting words,  repeating some words over and over. Pt moved in a limited manner when moving. Pt's thought process was coherent and relevant and judgement was impaired.  Memory appeared impaired. No indication of delusional thinking or response to internal stimuli. Pt's mood was stated to be depressed but not anxious and his blunted affect was congruent.  Pt was oriented x 4, to person, place, time and situation.   Diagnosis: MDD, Recurrent, Severe  Past Medical History:  Past Medical History:  Diagnosis Date  . Depression   . HCV (hepatitis C virus)   . Headache   . Hyperlipidemia   . Stroke [facility])     Past Surgical History:  Procedure Laterality Date  . CHOLECYSTECTOMY    . FOOT SURGERY     with metal brace in but  not any more   worn  brace on rt foot for 6 yrs    Family History: No family history on file.  Social History:  reports that he has quit smoking. His smoking use included Cigarettes. He has never used smokeless tobacco. He reports that he drinks about 1.2 - 1.8 oz of alcohol per week . He reports that he does not use drugs.  Additional Social History:  Alcohol / Drug Use Prescriptions: see MAR History of alcohol / drug use?: Yes Longest period of sobriety (when/how long): has been at [facility] for about 90 days Substance #1 Name of Substance 1: Alcohol 1 - Age of First Use: unknown 1 - Amount (size/oz): 2-3 cans of  beer 1 - Frequency: weekly 1 - Duration: years 1 - Last Use / Amount: unknown Substance #2 Name of Substance 2: Tobacco/Nicotine/Cogarettes 2 - Age of First Use: teens 2 - Amount (size/oz): unknown 2 - Frequency: unknown 2 - Duration: unknown 2 - Last Use / Amount: Former Smoker  CIWA: CIWA-Ar BP: 131/69 Pulse Rate: 75 COWS:    PATIENT STRENGTHS: (choose at least two) Average or above average intelligence Communication skills  Allergies:  Allergies  Allergen Reactions  . Codeine Nausea And Vomiting and Other (See Comments)    Sick on my stomach  .  Sulfa Antibiotics Nausea And Vomiting and Other (See Comments)    sick    Home Medications:  (Not in a hospital admission)  OB/GYN Status:  No LMP for male patient.  General Assessment Data [facility] of Assessment: [facility] TTS Assessment: In system Is this a Tele or Face-to-Face Assessment?: Tele Assessment Is this an Initial Assessment or a Re-assessment for this encounter?: Initial Assessment Marital status: Long term relationship (recently broken up (Oct 2017)) Living Arrangements: Other (Comment) ([facility]) Can pt return to current living arrangement?: Yes Admission Status: Voluntary Is patient capable of signing voluntary admission?: Yes Referral Source: Self/Family/Friend Insurance type:  [facility]([employer])     Crisis Care Plan Living Arrangements: Other (Comment) ([facility]) Legal Guardian:  (self) Name of Psychiatrist:  (pt could not recall name) Name of Therapist:  (none)  Education Status Is patient currently in school?: No  Risk to self with the past 6 months Suicidal Ideation: No-Not Currently/Within Last 6 Months (denies-earlier attempt to cut his throat with scissors) Has patient been a risk to self within the past 6 months prior to admission? : No Suicidal Intent: No (denies) Has patient had any suicidal intent within the past 6 months prior to admission? : No Is patient at risk for suicide?: Yes Suicidal Plan?: No-Not Currently/Within Last 6 Months Has patient had any suicidal plan within the past 6 months prior to admission? : No Access to Means: No What has been your use of drugs/alcohol within the last 12 months?:  (none) Previous Attempts/Gestures:  (pt would not answer) Other Self Harm Risks:  (none reported) Triggers for Past Attempts: None known Intentional Self Injurious Behavior: None Family Suicide History: Unknown Recent stressful life event(s): Recent negative physical changes, Turmoil (Comment) (Move  from home to nursing home about 90 days ago) Persecutory voices/beliefs?: No Depression: Yes Depression Symptoms: Despondent, Isolating, Fatigue, Loss of interest in usual pleasures, Feeling worthless/self pity, Feeling angry/irritable Substance abuse history and/or treatment for substance abuse?: No Suicide prevention information given to non-admitted patients: Not applicable  Risk to Others within the past 6 months Homicidal Ideation: No Does patient have any lifetime risk of violence toward others beyond the six months prior to admission? : No Thoughts of Harm to Others: No Current Homicidal Intent: No Current Homicidal Plan: No Access to Homicidal Means: No Identified Victim:  (none) History of harm to others?: No (none reported) Assessment of Violence: None Noted Does patient have access to weapons?: No Criminal Charges Pending?: No Does patient have a court date: No Is patient on probation?: No  Psychosis Hallucinations: None noted Delusions: None noted  Mental Status Report Appearance/Hygiene: Disheveled, Unremarkable, In scrubs Eye Contact: Fair Motor Activity: Freedom of movement Speech: Logical/coherent, Slurred Level of Consciousness: Alert Mood: Depressed, Angry Affect: Angry, Blunted, Depressed Anxiety Level: None Thought Processes: Coherent, Relevant Judgement: Impaired Orientation: Person, Place, Situation Obsessive  Compulsive Thoughts/Behaviors: None  Cognitive Functioning Concentration: Decreased Memory: Recent Impaired, Remote Impaired IQ: Average Insight: Poor Impulse Control: Poor Appetite: Poor Weight Loss:  (sts has lost weight recently; unidentified amount) Weight Gain:  (0) Sleep: No Change (sts amount varies) Total Hours of Sleep:  (varies) Vegetative Symptoms: Staying in bed, Decreased grooming  ADLScreening [facility] Assessment Services) Patient's cognitive ability adequate to safely complete daily activities?: Yes Patient able to express  need for assistance with ADLs?: Yes Independently performs ADLs?: No (hx of CVA; Hx of falls; Often uses wheelchair)  Prior Inpatient Therapy Prior Inpatient Therapy: Yes Prior Therapy Dates:  ("8-9 yrs ago") Prior Therapy Facilty/Provider(s):  (cannot recall name) Reason for Treatment:  (Depression)  Prior Outpatient Therapy Prior Outpatient Therapy: Yes Prior Therapy Dates:  ("15 years ago") Prior Therapy Facilty/Provider(s):  (cannot recall name) Reason for Treatment:  (Depression) Does patient have an ACCT team?: No Does patient have Intensive In-House Services?  : No Does patient have [facility] services? : No Does patient have P4CC services?: No  ADL Screening (condition at time of admission) Patient's cognitive ability adequate to safely complete daily activities?: Yes Patient able to express need for assistance with ADLs?: Yes Independently performs ADLs?: No (hx of CVA; Hx of falls; Often uses wheelchair)       Abuse/Neglect Assessment (Assessment to be complete while patient is alone) Physical Abuse: Denies Verbal Abuse: Denies Sexual Abuse: Denies Exploitation of patient/patient's resources: Denies Self-Neglect: Denies     Merchant navy officer (For Healthcare) Does Patient Have a Medical Advance Directive?: No Would patient like information on creating a medical advance directive?: No - Patient declined    Additional Information 1:1 In Past 12 Months?: No CIRT Risk: No Elopement Risk: No Does patient have medical clearance?: Yes     Disposition:  Disposition Initial Assessment Completed for this Encounter: Yes Disposition of Patient: Other dispositions Other disposition(s): Other (Comment) (Pending review w [facility])  Reviewed with Donell Sievert, PA: Pt meets IP criteria. Recommend IP tx. Recommend [facility] placement.  Spoke to [employer], [place]: Advised of recommendation. She sts she agrees.   Beryle Flock, MS, CRC, [facility] Triage Specialist [facility] T 03/22/2016 6:30 AM

## 2016-03-22 NOTE — ED Provider Notes (Signed)
[facility]-EMERGENCY DEPT Provider Note   CSN: [id number] Arrival date & time: 03/22/16  0335     History   Chief Complaint Chief Complaint  Patient presents with  . Suicidal    HPI Neil Lambert is a 71 y.o. male.  71 year old male with a history of dyslipidemia, stroke, and depression [facility] for suicidal ideations. Patient reportedly attempted to cut his throat with a dull pair of scissors tonight. Superficial cuts noted to the anterior neck. Patient will not expand on the motivating factors for his self injurious behavior tonight. Facility reports worsening depression since medication changes in December.      Past Medical History:  Diagnosis Date  . Depression   . HCV (hepatitis C virus)   . Headache   . Hyperlipidemia   . Stroke [facility](HCC)     Patient Active Problem List   Diagnosis Date Noted  . Avascular necrosis of hip, left (HCC) 01/29/2016  . Weakness of extremity   . Gait instability   . Elevated AST (SGOT) 12/14/2015  . Depression 12/14/2015  . History of CVA (cerebrovascular accident) 12/14/2015  . Right hemiplegia (HCC)   . Right hip pain   . Nondisplaced fracture of proximal phalanx of right lesser toe(s), initial encounter for closed fracture   . Generalized weakness 12/13/2015  . Hemiparesis (HCC) 12/15/2013  . Hepatitis C, chronic (HCC) 12/06/2010    Past Surgical History:  Procedure Laterality Date  . CHOLECYSTECTOMY    . FOOT SURGERY     with metal brace in but  not any more   worn  brace on rt foot for 6 yrs       Home Medications    Prior to Admission medications   Medication Sig Start Date End Date Taking? Authorizing Provider  alendronate (FOSAMAX) 70 MG tablet Take 70 mg by mouth once a week. Take with a full glass of water on an empty stomach.   Yes Historical Provider, MD  aspirin EC 81 MG EC tablet Take 1 tablet (81 mg total) by mouth daily. 12/17/15  Yes Freddrick MarchYashika Amin, MD    aspirin-acetaminophen-caffeine (EXCEDRIN MIGRAINE) [phone] MG tablet Take 2 tablets by mouth every 6 (six) hours as needed for headache.   Yes Historical Provider, MD  diazepam (VALIUM) 5 MG tablet Take 1 tablet (5 mg total) by mouth daily. Patient taking differently: Take 5 mg by mouth 2 (two) times daily. Take 1 tab in the morning and 1 tab at bedtime. 12/16/15  Yes Freddrick MarchYashika Amin, MD  escitalopram (LEXAPRO) 10 MG tablet Take 10 mg by mouth daily.   Yes Historical Provider, MD  oxyCODONE (OXY IR/ROXICODONE) 5 MG immediate release tablet Take 5 mg by mouth every 6 (six) hours as needed for severe pain.   Yes Historical Provider, MD  polyethylene glycol (MIRALAX / GLYCOLAX) packet Take 17 g by mouth daily.   Yes Historical Provider, MD  Propylene Glycol (SYSTANE BALANCE) 0.6 % SOLN Place 1 drop into both eyes 2 (two) times daily.   Yes Historical Provider, MD  QUEtiapine (SEROQUEL) 100 MG tablet Take 100 mg by mouth at bedtime.   Yes Historical Provider, MD  saccharomyces boulardii (FLORASTOR) 250 MG capsule Take 250 mg by mouth daily.   Yes Historical Provider, MD  Sodium Phosphates (ENEMA) 7-19 GM/118ML ENEM Place 118 mLs rectally daily as needed.   Yes Historical Provider, MD  oxyCODONE-acetaminophen (PERCOCET/ROXICET) 5-325 MG tablet Take 1 tablet by mouth every 6 (six) hours as needed (  hip/low back pain). 12/16/15   Freddrick March, MD    Family History No family history on file.  Social History Social History  Substance Use Topics  . Smoking status: Former Smoker    Types: Cigarettes  . Smokeless tobacco: Never Used  . Alcohol use 1.2 - 1.8 oz/week    2 - 3 Cans of beer per week     Comment: * packs a week     Allergies   Codeine and Sulfa antibiotics   Review of Systems Review of Systems  Unable to perform ROS: Other  Skin: Positive for wound.  Psychiatric/Behavioral: Positive for self-injury and suicidal ideas.  Level 5 caveat secondary to patient being  uncooperative.   Physical Exam Updated Vital Signs BP 131/69 (BP Location: Left Arm)   Pulse 73   Temp 98.8 F (37.1 C) (Oral)   Resp 16   SpO2 94%   Physical Exam  Constitutional: He is oriented to person, place, and time. He appears well-developed and well-nourished. No distress.  Nontoxic appearing  HENT:  Head: Normocephalic and atraumatic.  Patient tolerating secretions without difficulty  Eyes: Conjunctivae and EOM are normal. No scleral icterus.  Neck: Normal range of motion.  Cardiovascular: Normal rate, regular rhythm and intact distal pulses.   Pulmonary/Chest: Effort normal. No respiratory distress.  Respirations even and unlabored. No stridor.  Musculoskeletal: Normal range of motion.  Neurological: He is alert and oriented to person, place, and time. He exhibits normal muscle tone. Coordination normal.  Skin: Skin is warm and dry. No rash noted. He is not diaphoretic. No erythema. No pallor.  Superficial cuts to the anterior neck  Psychiatric: He is agitated and withdrawn. He exhibits a depressed mood. He expresses no homicidal ideation.  Nursing note and vitals reviewed.    ED Treatments / Results  Labs (all labs ordered are listed, but only abnormal results are displayed) Labs Reviewed  COMPREHENSIVE METABOLIC PANEL - Abnormal; Notable for the following:       Result Value   Calcium 8.3 (*)    Total Protein 6.4 (*)    Albumin 2.7 (*)    AST 66 (*)    Total Bilirubin 1.5 (*)    Anion gap 3 (*)    All other components within normal limits  ACETAMINOPHEN LEVEL - Abnormal; Notable for the following:    Acetaminophen (Tylenol), Serum <10 (*)    All other components within normal limits  CBC - Abnormal; Notable for the following:    RBC 3.92 (*)    Hemoglobin 12.3 (*)    HCT 36.4 (*)    Platelets 140 (*)    All other components within normal limits  ETHANOL  SALICYLATE LEVEL  RAPID URINE DRUG SCREEN, HOSP PERFORMED    EKG  EKG  Interpretation None       Radiology No results found.  Procedures Procedures (including critical care time)  Medications Ordered in ED Medications - No data to display   Initial Impression / Assessment and Plan / ED Course  I have reviewed the triage vital signs and the nursing notes.  Pertinent labs & imaging results that were available during my care of the patient were reviewed by me and considered in my medical decision making (see chart for details).  Clinical Course     Patient presenting from [facility] for evaluation of depression and suicidal ideations. Patient attempted to slit his throat with a dull. Scissors tonight. Patient medically cleared. He is to be evaluated by TTS.  The patient is here voluntarily at the present time. He will require IVC if he attempts to leave prior to psych clearance.   Final Clinical Impressions(s) / ED Diagnoses   Final diagnoses:  Self-inflicted injury  Severe episode of recurrent major depressive disorder, without psychotic features [facility])    New Prescriptions New Prescriptions   No medications on file     Antony Madura, PA-C 03/22/16 0523    Tilden Fossa, MD 03/31/16 [phone]

## 2016-03-22 NOTE — BH Assessment (Signed)
[facility] Assessment Progress Note  Per Thedore MinsMojeed Akintayo, MD, this pt requires psychiatric hospitalization at this time.  At 16:10, Corrie DandyMary calls from [facility] to report that pt has been accepted to their facility by Dr Lowanda FosterBeverly Jones.  This Clinical research associatewriter spoke to pt, who is under voluntary status, and he does not want to be hospitalized, instead wanting to be discharged.  This was staffed with Nanine MeansJamison Lord, DNP, and with EDP Shaune Pollackameron Isaacs, MD, and it was decided that pt meets criteria for IVC, and should be transferred to [facility].  Dr Erma HeritageIsaacs has initiated IVC, and [place].  At 15:56 Magistrate Maisie Fushomas confirms receipt.  As of this writing, service of Findings and Custody Order is pending.  Pt's nurse, Harvie HeckRandy, has been notified.  She agrees to fax Findings and Custody Order to [facility] at [phone], and to call report to [phone].  [place] [facility]  Doylene Canninghomas Emanie Behan, MA Triage Specialist [phone]

## 2016-03-22 NOTE — ED Notes (Signed)
TTS at bedside. 

## 2016-03-22 NOTE — ED Notes (Signed)
PA at bedside.

## 2016-03-22 NOTE — ED Notes (Signed)
Bed: RESB Expected date:  Expected time:  Means of arrival:  Comments: 71 yo M suicidal

## 2016-03-23 NOTE — ED Notes (Signed)
Sheriff called and given report on [place] transport to [facility].

## 2016-03-27 DIAGNOSIS — F4323 Adjustment disorder with mixed anxiety and depressed mood: Secondary | ICD-10-CM | POA: Insufficient documentation

## 2016-03-27 DIAGNOSIS — M47812 Spondylosis without myelopathy or radiculopathy, cervical region: Secondary | ICD-10-CM | POA: Insufficient documentation

## 2016-03-27 DIAGNOSIS — G894 Chronic pain syndrome: Secondary | ICD-10-CM | POA: Insufficient documentation

## 2016-04-03 ENCOUNTER — Non-Acute Institutional Stay (SKILLED_NURSING_FACILITY): Payer: Medicare Other | Admitting: Adult Health

## 2016-04-03 DIAGNOSIS — B182 Chronic viral hepatitis C: Secondary | ICD-10-CM | POA: Diagnosis not present

## 2016-04-03 DIAGNOSIS — G8191 Hemiplegia, unspecified affecting right dominant side: Secondary | ICD-10-CM | POA: Diagnosis not present

## 2016-04-03 DIAGNOSIS — Z8673 Personal history of transient ischemic attack (TIA), and cerebral infarction without residual deficits: Secondary | ICD-10-CM | POA: Diagnosis not present

## 2016-04-03 DIAGNOSIS — M87052 Idiopathic aseptic necrosis of left femur: Secondary | ICD-10-CM | POA: Diagnosis not present

## 2016-04-03 DIAGNOSIS — F4323 Adjustment disorder with mixed anxiety and depressed mood: Secondary | ICD-10-CM | POA: Diagnosis not present

## 2016-04-03 DIAGNOSIS — G894 Chronic pain syndrome: Secondary | ICD-10-CM

## 2016-04-03 DIAGNOSIS — I10 Essential (primary) hypertension: Secondary | ICD-10-CM | POA: Diagnosis not present

## 2016-04-04 ENCOUNTER — Other Ambulatory Visit: Payer: Self-pay | Admitting: *Deleted

## 2016-04-04 MED ORDER — OXYCODONE-ACETAMINOPHEN 5-325 MG PO TABS: 1.0000 | ORAL_TABLET | Freq: Four times a day (QID) | ORAL | 0 refills | Status: DC | PRN

## 2016-04-04 MED ORDER — OXYCODONE HCL 5 MG PO TABS: ORAL_TABLET | ORAL | 0 refills | Status: DC

## 2016-04-04 MED ORDER — DIAZEPAM 5 MG PO TABS: ORAL_TABLET | ORAL | 0 refills | Status: DC

## 2016-04-04 NOTE — Telephone Encounter (Signed)
[facility] #[phone] Fax:[phone]  

## 2016-04-24 DIAGNOSIS — I10 Essential (primary) hypertension: Secondary | ICD-10-CM | POA: Insufficient documentation

## 2016-04-24 NOTE — Progress Notes (Signed)
Location:   [facility]    Place of Service:  SNF (31)   CODE STATUS: full code  Allergies  Allergen Reactions  . Codeine Nausea And Vomiting and Other (See Comments)    Sick on my stomach  . Sulfa Antibiotics Nausea And Vomiting and Other (See Comments)    sick    Chief Complaint  Patient presents with  . Hospitalization Follow-up    HPI:  He is a resident of this facility who has been hospitalized for his significant psych problems including major depression; psychosis; and suicidal ideation. He was treated and was ready for discharge back to facility. At this time; this does more than likely represent a long term placement for him in either snf or assisted living.   Past Medical History:  Diagnosis Date  . Depression   . HCV (hepatitis C virus)   . Headache   . Hyperlipidemia   . Stroke [facility])     Past Surgical History:  Procedure Laterality Date  . CHOLECYSTECTOMY    . FOOT SURGERY     with metal brace in but  not any more   worn  brace on rt foot for 6 yrs    Social History   Social History  . Marital status: Single    Spouse name: N/A  . Number of children: N/A  . Years of education: N/A   Occupational History  . Not on file.   Social History Main Topics  . Smoking status: Former Smoker    Types: Cigarettes  . Smokeless tobacco: Never Used  . Alcohol use 1.2 - 1.8 oz/week    2 - 3 Cans of beer per week     Comment: * packs a week  . Drug use: No  . Sexual activity: Yes   Other Topics Concern  . Not on file   Social History Narrative  . No narrative on file   No family history on file.    VITAL SIGNS BP 130/68   Pulse 70   Temp 98 F (36.7 C)   Resp 18   Ht 5\' 9"  (1.753 m)   Wt 147 lb (66.7 kg)   SpO2 94%   BMI 21.71 kg/m   Patient's Medications  New Prescriptions   No medications on file  Previous Medications   ALENDRONATE (FOSAMAX) 70 MG TABLET    Take 70 mg by mouth once a week. Take with a full glass of water on an empty  stomach.   ASPIRIN EC 81 MG EC TABLET    Take 1 tablet (81 mg total) by mouth daily.   ATORVASTATIN (LIPITOR) 10 MG TABLET    Take 10 mg by mouth daily at 6 PM.   DIAZEPAM (VALIUM) 5 MG TABLET    Take one tablet by mouth twice daily for anxiety   ESCITALOPRAM (LEXAPRO) 10 MG TABLET    Take 10 mg by mouth daily.   GABAPENTIN (NEURONTIN) 100 MG CAPSULE    Take 100 mg by mouth at bedtime.   OXYCODONE (OXY IR/ROXICODONE) 5 MG IMMEDIATE RELEASE TABLET    Take one tablet by mouth every 6 hours as needed for pain   OXYCODONE-ACETAMINOPHEN (PERCOCET/ROXICET) 5-325 MG TABLET    Take 1 tablet by mouth every 6 (six) hours as needed (hip/low back pain).   POLYETHYLENE GLYCOL (MIRALAX / GLYCOLAX) PACKET    Take 17 g by mouth daily.   PRAZOSIN (MINIPRESS) 1 MG CAPSULE    Take 1 mg by mouth at bedtime.  PROPYLENE GLYCOL (SYSTANE BALANCE) 0.6 % SOLN    Place 1 drop into both eyes 2 (two) times daily.   QUETIAPINE (SEROQUEL) 100 MG TABLET    Take 100 mg by mouth at bedtime.   SACCHAROMYCES BOULARDII (FLORASTOR) 250 MG CAPSULE    Take 250 mg by mouth daily.   SODIUM PHOSPHATES (ENEMA) 7-19 GM/118ML ENEM    Place 118 mLs rectally daily as needed.  Modified Medications   No medications on file  Discontinued Medications   ASPIRIN-ACETAMINOPHEN-CAFFEINE (EXCEDRIN MIGRAINE) 250-250-65 MG TABLET    Take 2 tablets by mouth every 6 (six) hours as needed for headache.     SIGNIFICANT DIAGNOSTIC EXAMS   12-13-15: right ankle x-ray: Soft tissue swelling laterally. No fracture. Ankle mortise appears intact. 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  12-13-15: right foot x-ray: Acute fracture proximal aspect fourth proximal phalanx with slight impaction at the fracture site. No other acute fracture evident. Old trauma with remodeling involving the cuboid bone. No dislocation. No appreciable joint space narrowing.   12-13-15: pelvic x-ray: No fracture or dislocation. Mild symmetric narrowing of both hip  joints. Note that on the pelvic image, there is a lucency in the lateral left femoral head, potentially a focus of avascular necrosis. Note that MR is the imaging study of choice to assess for avascular necrosis.  12-14-15: MRI of brain: 1.  No acute intracranial abnormality. 2. 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 3. 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  12-15-15: MRI of cervical spine: Normal appearing cervical cord. No finding to explain the patient's symptoms.  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 Please see above for descriptions of individual levels. Normal appearing cervical cord. No finding to explain the patient's symptoms. 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 Please see above for descriptions of individual levels.   12-29-15: MRI of left hip 1. 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 There is internal enhancement in a trace amount of surrounding marrow edema. 2. Mild-to-moderate degenerative findings in both hips. 3. Small tear of the anterior superior acetabular labrum on the left. 4. There is some patchy muscular edema especially on the right side involving the quadriceps, gluteus minimus, and gluteus medius. This is nonspecific but could reflect a low-grade myositis or muscular strains. Some of this edema tracks deep to the right iliotibial band. 5. Marrow heterogeneity is present. Although this can be caused by marrow infiltrative processes, the most common causes include anemia, smoking, obesity, or advancing age.  03-11-16: right tibia/fibula and ankle x-ray: unremarkable tibia, fibula and ankle   03-11-16: right foot x-ray: unremarkable foot      LABS REVIEWED:   12-14-15: vit B 12: 716; folate 14.3; tsh 2.523; RPR; nr; chol 126; ldl 85; trig 49; hdl 31 12-15-15: wbc 5.6; hgb 12.3; hct 38.8; mcv 100.5; plt 124; glucose 102; bun 8; creat 0.69; k+ 4.0; na++ 135  01-18-16: wbc 5.7; hgb 13.2; hct 41.4; mcv 99.8; plt 102; glucose 84; bun 5.0; creat 0.62; k+ 4.2; na++ 138 total bili 1.7; ast 47; albumin 3.1  02-09-16: wbc 6.9; hgb 13.7; hct 41.9; mcv 96.9; plt 147; glucose 102; bun 8.5; creat 0.59; k+ 4.3; na++ 139; total bili 1.7; ast 52; albumin 3.2  tsh 2.14  02-15-16: wbc 7.6; hgb 12.7; hct 38.7; mcv 96.0; plt 133; glucose 95; bun 8.7; creat 0.59; k+ 4.4; na++ 136; total bili 1.7; ast 45; albumin 3.0  02-22-16: vit B 12: 1037; folic: >20.0   Review of Systems  Constitutional: Negative for malaise/fatigue.  Respiratory: Negative for cough and shortness of breath.   Cardiovascular: Negative for chest pain, palpitations and leg swelling.  Gastrointestinal: Negative for abdominal pain, constipation and heartburn.  Musculoskeletal: no joint pain . Negative for back pain and myalgias.  Skin: Negative.   Neurological: Negative for dizziness.  Psychiatric/Behavioral: The patient is not nervous/anxious.     Physical Exam  Constitutional: He is oriented to person, place, and time. No distress.  Eyes: Conjunctivae are normal.  Neck: Neck supple. No JVD present. No thyromegaly present.  Cardiovascular: Normal rate, regular rhythm and intact distal pulses.   Respiratory: Effort normal and breath sounds normal. No respiratory distress. He has no wheezes.  GI: Soft. Bowel sounds are normal. He exhibits no distension. There is no tenderness.  Musculoskeletal: He exhibits no edema.  Has right hemiparesis   Lymphadenopathy:    He has no cervical adenopathy.  Neurological: He is alert and oriented to person, place, and time.  Speech is slurred Skin: Skin is warm and dry. He is not diaphoretic.  Psychiatric: He has a normal mood and affect.      ASSESSMENT/ PLAN:  1. Left hip avascular necrosis: 1.5 x 2.4 x 0.8 cm on left hip on 12-29-15 .  Will continue  fosamax 70 mg weekly and will monitor  2. 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  Chronic hepatitis C: will continue to monitor   4. Depression: will continue lexapro 10 mg daily will continue valium 5 mg twice daily for anxiety   5. Constipation: will continue miralax daily   6. Psychosis: had suicidal ideation: will continue seroquel 100 mg nightly   7. Gait instability and weakness: will continue therapy as directed and will monitor   8. Hypertension: will continue minipress 1 mg nightly     Time spent with patient 50   minutes >50% time spent counseling; reviewing medical record; tests; labs; and developing future plan of care      MD is aware of resident's narcotic use and is in agreement with current plan of care. We will attempt to wean resident as apropriate   Synthia Innocent NP [facility]  Contact [phone] Monday through Friday 8am- 5pm  After hours call [phone]

## 2016-05-03 ENCOUNTER — Encounter: Payer: Self-pay | Admitting: Internal Medicine

## 2016-05-03 ENCOUNTER — Non-Acute Institutional Stay (SKILLED_NURSING_FACILITY): Payer: Medicare Other | Admitting: Internal Medicine

## 2016-05-03 DIAGNOSIS — G8191 Hemiplegia, unspecified affecting right dominant side: Secondary | ICD-10-CM | POA: Diagnosis not present

## 2016-05-03 DIAGNOSIS — R2681 Unsteadiness on feet: Secondary | ICD-10-CM | POA: Diagnosis not present

## 2016-05-03 DIAGNOSIS — Z8673 Personal history of transient ischemic attack (TIA), and cerebral infarction without residual deficits: Secondary | ICD-10-CM

## 2016-05-03 DIAGNOSIS — G894 Chronic pain syndrome: Secondary | ICD-10-CM | POA: Diagnosis not present

## 2016-05-03 DIAGNOSIS — B182 Chronic viral hepatitis C: Secondary | ICD-10-CM

## 2016-05-03 DIAGNOSIS — M25551 Pain in right hip: Secondary | ICD-10-CM | POA: Diagnosis not present

## 2016-05-03 DIAGNOSIS — M87052 Idiopathic aseptic necrosis of left femur: Secondary | ICD-10-CM | POA: Diagnosis not present

## 2016-05-03 DIAGNOSIS — I10 Essential (primary) hypertension: Secondary | ICD-10-CM

## 2016-05-03 DIAGNOSIS — F333 Major depressive disorder, recurrent, severe with psychotic symptoms: Secondary | ICD-10-CM | POA: Diagnosis not present

## 2016-05-03 NOTE — Progress Notes (Signed)
Patient ID: Neil Lambert, male   DOB: [birth year], 71 y.o.   MRN: [id number]    DATE: 05/03/2016  Location:    [facility] Room Number: 129 A Place of Service: SNF (31)   Extended Emergency Contact Information Primary Emergency Contact: OTT,CHARLES Address: [address]          [place],  [place] Home Phone: [phone] Relation: None Secondary Emergency Contact: [place] Phone: [phone] Relation: Significant other  Advanced Directive information Does Patient Have a Medical Advance Directive?: Yes, Type of Advance Directive: Out of facility DNR (pink MOST or yellow form), Pre-existing out of facility DNR order (yellow form or pink MOST form): Yellow form placed in chart (order not valid for inpatient use)  Chief Complaint  Patient presents with  . Medical Management of Chronic Issues    Routine Visit    HPI:  71 yo male long term resident seen today for f/u. He is c/a running out of money while living at [facility]. He c/o pain in his right hip and left ankle. 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 meds adjusted and he is currently only taking seroquel. Appetite ok. Sleeps well most nights but does have nightmare occasionally. No SI/HI. He states he was released from PT/OT. Gait remains unstable. He is a poor historian due to psych d/o. Hx obtained from chart.  Left hip avascular necrosis - size 1.5 x 2.4 x 0.8 cm on 12-29-15. He was seen by Ortho Dr Erlinda Hong and he is not a surgical candidate. He has chronic right hip pain and takes percocet and roxicodone prn pain. He also takes gabapentin for neuropathy. He is benefiting from opioid tx. Pain mx by pain specialist. Takes fosamax 70 mg weekly  Hx CVA - he has right hemiplegia. Stable on ASA 81 mg daily  Chronic hepatitis C - LFTs stable (AST 66; ALT 40)    Depression/anxiety - mood stable on lexapro 10 mg daily; valium 5 mg twice daily for  anxiety. Followed by psych services  Chronic Constipation - stable on miralax daily   Psychosis - he continues to have SI and takes seroquel 100 mg nightly. Followed by psych services   Hypertension - BP stable on minipress 1 mg nightly. Takes ASA daily  Past Medical History:  Diagnosis Date  . Depression   . HCV (hepatitis C virus)   . Headache   . Hyperlipidemia   . Stroke [facility])     Past Surgical History:  Procedure Laterality Date  . CHOLECYSTECTOMY    . FOOT SURGERY     with metal brace in but  not any more   worn  brace on rt foot for 6 yrs    Patient Care Team: Leanna Battles, MD as PCP - General (Internal Medicine)  Social History   Social History  . Marital status: Single    Spouse name: N/A  . Number of children: N/A  . Years of education: N/A   Occupational History  . Not on file.   Social History Main Topics  . Smoking status: Former Smoker    Types: Cigarettes  . Smokeless tobacco: Never Used  . Alcohol use 1.2 - 1.8 oz/week    2 - 3 Cans of beer per week     Comment: * packs a week  . Drug use: No  . Sexual activity: Yes   Other Topics Concern  . Not on file  Social History Narrative  . No narrative on file     reports that he has quit smoking. His smoking use included Cigarettes. He has never used smokeless tobacco. He reports that he drinks about 1.2 - 1.8 oz of alcohol per week . He reports that he does not use drugs.  History reviewed. No pertinent family history. No family status information on file.    Immunization History  Administered Date(s) Administered  . PPD Test 12/28/2015    Allergies  Allergen Reactions  . Codeine Nausea And Vomiting and Other (See Comments)    Sick on my stomach  . Sulfa Antibiotics Nausea And Vomiting and Other (See Comments)    sick    Medications: Patient's Medications  New Prescriptions   No medications on file  Previous Medications   QUETIAPINE (SEROQUEL) 100 MG TABLET    Take 100 mg by  mouth at bedtime.  Modified Medications   No medications on file  Discontinued Medications   ALENDRONATE (FOSAMAX) 70 MG TABLET    Take 70 mg by mouth once a week. Take with a full glass of water on an empty stomach.   ASPIRIN EC 81 MG EC TABLET    Take 1 tablet (81 mg total) by mouth daily.   ATORVASTATIN (LIPITOR) 10 MG TABLET    Take 10 mg by mouth daily at 6 PM.   DIAZEPAM (VALIUM) 5 MG TABLET    Take one tablet by mouth twice daily for anxiety   ESCITALOPRAM (LEXAPRO) 10 MG TABLET    Take 10 mg by mouth daily.   GABAPENTIN (NEURONTIN) 100 MG CAPSULE    Take 100 mg by mouth at bedtime.   OXYCODONE (OXY IR/ROXICODONE) 5 MG IMMEDIATE RELEASE TABLET    Take one tablet by mouth every 6 hours as needed for pain   OXYCODONE-ACETAMINOPHEN (PERCOCET/ROXICET) 5-325 MG TABLET    Take 1 tablet by mouth every 6 (six) hours as needed (hip/low back pain).   POLYETHYLENE GLYCOL (MIRALAX / GLYCOLAX) PACKET    Take 17 g by mouth daily.   PRAZOSIN (MINIPRESS) 1 MG CAPSULE    Take 1 mg by mouth at bedtime.   PROPYLENE GLYCOL (SYSTANE BALANCE) 0.6 % SOLN    Place 1 drop into both eyes 2 (two) times daily.   SACCHAROMYCES BOULARDII (FLORASTOR) 250 MG CAPSULE    Take 250 mg by mouth daily.   SODIUM PHOSPHATES (ENEMA) 7-19 GM/118ML ENEM    Place 118 mLs rectally daily as needed.    Review of Systems  Unable to perform ROS: Psychiatric disorder    Vitals:   05/03/16 1208  BP: (!) 163/88  Pulse: 81  Resp: 18  Temp: 97.6 F (36.4 C)  TempSrc: Oral  SpO2: 98%   There is no height or weight on file to calculate BMI.  Physical Exam  Constitutional: He appears well-developed and well-nourished.  Frail appearing in NAD sitting up in bed  HENT:  Mouth/Throat: Oropharynx is clear and moist.  MMM  Eyes: Pupils are equal, round, and reactive to light. No scleral icterus.  Neck: Neck supple. Carotid bruit is not present. No thyromegaly present.  Cardiovascular: Normal rate, regular rhythm, normal heart  sounds and intact distal pulses.  Exam reveals no gallop and no friction rub.   No murmur heard. Trace LLE edema and +1 pitting RLE edema. No calf TTP  Pulmonary/Chest: Effort normal and breath sounds normal. He has no wheezes. He has no rales. He exhibits no tenderness.  Abdominal: Soft.  Bowel sounds are normal. He exhibits no distension, no abdominal bruit, no pulsatile midline mass and no mass. There is no hepatomegaly. There is no tenderness. There is no rebound and no guarding.  Musculoskeletal: He exhibits edema (RLE>RUE), tenderness and deformity (right 4th toe; NT).  RUE contracture - flexion at wrist  Lymphadenopathy:    He has no cervical adenopathy.  Neurological: He is alert.  Right hemiplegia and right hemiparesis and right foot drop; no right grip strength  Skin: Skin is warm and dry. No rash noted.  Psychiatric: Thought content normal. His mood appears anxious. His speech is slurred. He is agitated.     Labs reviewed: Admission on 03/22/2016, Discharged on 03/23/2016  Component Date Value Ref Range Status  . Sodium 03/22/2016 135  135 - 145 mmol/L Final  . Potassium 03/22/2016 3.5  3.5 - 5.1 mmol/L Final  . Chloride 03/22/2016 108  101 - 111 mmol/L Final  . CO2 03/22/2016 24  22 - 32 mmol/L Final  . Glucose, Bld 03/22/2016 99  65 - 99 mg/dL Final  . BUN 48/30/7354 7  6 - 20 mg/dL Final  . Creatinine, Ser 03/22/2016 0.68  0.61 - 1.24 mg/dL Final  . Calcium 30/14/8403 8.3* 8.9 - 10.3 mg/dL Final  . Total Protein 03/22/2016 6.4* 6.5 - 8.1 g/dL Final  . Albumin 97/95/3692 2.7* 3.5 - 5.0 g/dL Final  . AST 23/00/9794 66* 15 - 41 U/L Final  . ALT 03/22/2016 40  17 - 63 U/L Final  . Alkaline Phosphatase 03/22/2016 98  38 - 126 U/L Final  . Total Bilirubin 03/22/2016 1.5* 0.3 - 1.2 mg/dL Final  . GFR calc non Af Amer 03/22/2016 >60  >60 mL/min Final  . GFR calc Af Amer 03/22/2016 >60  >60 mL/min Final   Comment: (NOTE) The eGFR has been calculated using the CKD EPI  equation. This calculation has not been validated in all clinical situations. eGFR's persistently <60 mL/min signify possible Chronic Kidney Disease.   . Anion gap 03/22/2016 3* 5 - 15 Final  . Alcohol, Ethyl (B) 03/22/2016 <5  <5 mg/dL Final   Comment:        LOWEST DETECTABLE LIMIT FOR SERUM ALCOHOL IS 5 mg/dL FOR MEDICAL PURPOSES ONLY   . Salicylate Lvl 03/22/2016 <7.0  2.8 - 30.0 mg/dL Final  . Acetaminophen (Tylenol), Serum 03/22/2016 <10* 10 - 30 ug/mL Final   Comment:        THERAPEUTIC CONCENTRATIONS VARY SIGNIFICANTLY. A RANGE OF 10-30 ug/mL MAY BE AN EFFECTIVE CONCENTRATION FOR MANY PATIENTS. HOWEVER, SOME ARE BEST TREATED AT CONCENTRATIONS OUTSIDE THIS RANGE. ACETAMINOPHEN CONCENTRATIONS >150 ug/mL AT 4 HOURS AFTER INGESTION AND >50 ug/mL AT 12 HOURS AFTER INGESTION ARE OFTEN ASSOCIATED WITH TOXIC REACTIONS.   . WBC 03/22/2016 7.6  4.0 - 10.5 K/uL Final  . RBC 03/22/2016 3.92* 4.22 - 5.81 MIL/uL Final  . Hemoglobin 03/22/2016 12.3* 13.0 - 17.0 g/dL Final  . HCT 99/71/8209 36.4* 39.0 - 52.0 % Final  . MCV 03/22/2016 92.9  78.0 - 100.0 fL Final  . MCH 03/22/2016 31.4  26.0 - 34.0 pg Final  . MCHC 03/22/2016 33.8  30.0 - 36.0 g/dL Final  . RDW 90/68/9340 15.0  11.5 - 15.5 % Final  . Platelets 03/22/2016 140* 150 - 400 K/uL Final  . Opiates 03/22/2016 NONE DETECTED  NONE DETECTED Final  . Cocaine 03/22/2016 NONE DETECTED  NONE DETECTED Final  . Benzodiazepines 03/22/2016 POSITIVE* NONE DETECTED Final  . Amphetamines 03/22/2016 NONE DETECTED  NONE DETECTED Final  . Tetrahydrocannabinol 03/22/2016 NONE DETECTED  NONE DETECTED Final  . Barbiturates 03/22/2016 NONE DETECTED  NONE DETECTED Final   Comment:        DRUG SCREEN FOR MEDICAL PURPOSES ONLY.  IF CONFIRMATION IS NEEDED FOR ANY PURPOSE, NOTIFY LAB WITHIN 5 DAYS.        LOWEST DETECTABLE LIMITS FOR URINE DRUG SCREEN Drug Class       Cutoff (ng/mL) Amphetamine      1000 Barbiturate       200 Benzodiazepine   185 Tricyclics       631 Opiates          300 Cocaine          300 THC              50   . TSH 03/22/2016 2.029  0.350 - 4.500 uIU/mL Final  . Color, Urine 03/22/2016 YELLOW  YELLOW Final  . APPearance 03/22/2016 CLEAR  CLEAR Final  . Specific Gravity, Urine 03/22/2016 1.012  1.005 - 1.030 Final  . pH 03/22/2016 7.0  5.0 - 8.0 Final  . Glucose, UA 03/22/2016 NEGATIVE  NEGATIVE mg/dL Final  . Hgb urine dipstick 03/22/2016 NEGATIVE  NEGATIVE Final  . Bilirubin Urine 03/22/2016 NEGATIVE  NEGATIVE Final  . Ketones, ur 03/22/2016 NEGATIVE  NEGATIVE mg/dL Final  . Protein, ur 03/22/2016 NEGATIVE  NEGATIVE mg/dL Final  . Nitrite 03/22/2016 NEGATIVE  NEGATIVE Final  . Leukocytes, UA 03/22/2016 NEGATIVE  NEGATIVE Final  . RBC / HPF 03/22/2016 0-5  0 - 5 RBC/hpf Final  . WBC, UA 03/22/2016 0-5  0 - 5 WBC/hpf Final  . Bacteria, UA 03/22/2016 NONE SEEN  NONE SEEN Final  . Squamous Epithelial / LPF 03/22/2016 NONE SEEN  NONE SEEN Final    No results found.   Assessment/Plan   ICD-9-CM ICD-10-CM   1. Right hip pain 719.45 M25.551   2. Chronic pain syndrome 338.4 G89.4   3. History of CVA (cerebrovascular accident) V12.54 Z86.73   4. Right hemiplegia (HCC) 342.90 G81.91   5. Avascular necrosis of hip, left (HCC) 733.42 M87.052   6. MDD (major depressive disorder), recurrent, severe, with psychosis ([place]) 296.34 F33.3   7. Chronic hepatitis C without hepatic coma (HCC) 070.54 B18.2   8. Gait instability 781.2 R26.81   9. Hypertension, benign 401.1 I10      Cont seroquel as ordered  Psych services to follow  T/c lidoderm patch for right hip pain  F/u with pain mx as scheduled  PT/OT/ST as indicated  Will follow  Artice Holohan S. Perlie Gold  [facility] [address] [place], [place] [address] [phone] Cell (Monday-Friday 8 AM - 5 PM) [phone] After 5 PM and follow prompts

## 2016-05-16 ENCOUNTER — Encounter: Payer: Self-pay | Admitting: Adult Health

## 2016-05-16 ENCOUNTER — Non-Acute Institutional Stay (SKILLED_NURSING_FACILITY): Payer: Medicare Other | Admitting: Adult Health

## 2016-05-16 DIAGNOSIS — R21 Rash and other nonspecific skin eruption: Secondary | ICD-10-CM | POA: Diagnosis not present

## 2016-05-16 NOTE — Progress Notes (Signed)
Location:   [facility] Room Number: 129 A Place of Service:  SNF (31)   CODE STATUS: DNR  Allergies  Allergen Reactions  . Codeine Nausea And Vomiting and Other (See Comments)    Sick on my stomach  . Sulfa Antibiotics Nausea And Vomiting and Other (See Comments)    sick    Chief Complaint  Patient presents with  . Acute Visit    rash on scrotum    HPI:  He has a papular rash on his scrotum. Some of the blisters have burst and bled. They have clear fluid present. He states that the rash happens every few months. I am concerned about possible herpes rash. He does have a history of hepatitis C.   Past Medical History:  Diagnosis Date  . Depression   . HCV (hepatitis C virus)   . Headache   . Hyperlipidemia   . Stroke [facility])     Past Surgical History:  Procedure Laterality Date  . CHOLECYSTECTOMY    . FOOT SURGERY     with metal brace in but  not any more   worn  brace on rt foot for 6 yrs    Social History   Social History  . Marital status: Single    Spouse name: N/A  . Number of children: N/A  . Years of education: N/A   Occupational History  . Not on file.   Social History Main Topics  . Smoking status: Former Smoker    Types: Cigarettes  . Smokeless tobacco: Never Used  . Alcohol use 1.2 - 1.8 oz/week    2 - 3 Cans of beer per week     Comment: * packs a week  . Drug use: No  . Sexual activity: Yes   Other Topics Concern  . Not on file   Social History Narrative  . No narrative on file   History reviewed. No pertinent family history.    VITAL SIGNS BP 131/79   Pulse 80   Temp 98 F (36.7 C)   Resp 20   Ht 5\' 9"  (1.753 m)   SpO2 93%   Patient's Medications  New Prescriptions   No medications on file  Previous Medications   ALENDRONATE (FOSAMAX) 70 MG TABLET    Take 70 mg by mouth once a week. Take with a full glass of water on an empty stomach.   ASPIRIN EC 81 MG TABLET    Take 81 mg by mouth daily.   ATORVASTATIN  (LIPITOR) 10 MG TABLET    Take 10 mg by mouth daily.   DIAZEPAM (VALIUM) 5 MG TABLET    Take 5 mg by mouth 2 (two) times daily.   ESCITALOPRAM (LEXAPRO) 10 MG TABLET    Take 10 mg by mouth daily.   GABAPENTIN (NEURONTIN) 100 MG CAPSULE    Take 100 mg by mouth at bedtime.   LIDOCAINE (LIDODERM) 5 %    Apply to right hip topically one time a day for 12 hours and take off   OXYCODONE (OXY IR/ROXICODONE) 5 MG IMMEDIATE RELEASE TABLET    Take 5 mg by mouth every 6 (six) hours as needed for severe pain.   OXYCODONE-ACETAMINOPHEN (PERCOCET/ROXICET) 5-325 MG TABLET    Take 1 tablet by mouth every 6 (six) hours as needed for severe pain.   POLYETHYLENE GLYCOL (MIRALAX / GLYCOLAX) PACKET    Take 17 g by mouth daily.   PRAZOSIN (MINIPRESS) 1 MG CAPSULE    Take 1  mg by mouth at bedtime.   PROPYLENE GLYCOL (SYSTANE BALANCE OP)    Instill 1 drop in both eyes 2 times a day for dry eyes   QUETIAPINE FUMARATE (SEROQUEL XR) 150 MG 24 HR TABLET    Take 150 mg by mouth at bedtime.   SACCHAROMYCES BOULARDII (FLORASTOR) 250 MG CAPSULE    Take 250 mg by mouth daily.   SODIUM PHOSPHATE (FLEET) 7-19 GM/118ML ENEM    Place 1 enema rectally daily as needed for severe constipation.  Modified Medications   No medications on file  Discontinued Medications   QUETIAPINE (SEROQUEL) 100 MG TABLET    Take 100 mg by mouth at bedtime.     SIGNIFICANT DIAGNOSTIC EXAMS  12-13-15: right ankle x-ray: Soft tissue swelling laterally. No fracture. Ankle mortise appears intact. 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  12-13-15: right foot x-ray: Acute fracture proximal aspect fourth proximal phalanx with slight impaction at the fracture site. No other acute fracture evident. Old trauma with remodeling involving the cuboid bone. No dislocation. No appreciable joint space narrowing.   12-13-15: pelvic x-ray: No fracture or dislocation. Mild symmetric narrowing of both hip joints. Note that on the pelvic image, there is a  lucency in the lateral left femoral head, potentially a focus of avascular necrosis. Note that MR is the imaging study of choice to assess for avascular necrosis.  12-14-15: MRI of brain: 1.  No acute intracranial abnormality. 2. 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 3. 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  12-15-15: MRI of cervical spine: Normal appearing cervical cord. No finding to explain the patient's symptoms.  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 Please see above for descriptions of individual levels. Normal appearing cervical cord. No finding to explain the patient's symptoms. 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 Please see above for descriptions of individual levels.   12-29-15: MRI of left hip 1. 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 There is internal enhancement in a trace amount of surrounding marrow edema. 2. Mild-to-moderate degenerative findings in both hips. 3. Small tear of the anterior superior acetabular labrum on the left. 4. There is some patchy muscular edema especially on the right side involving the quadriceps, gluteus minimus, and gluteus medius. This is nonspecific but could reflect a low-grade myositis or muscular strains. Some of this edema tracks deep to the right iliotibial band. 5. Marrow heterogeneity is present. Although this can be caused by marrow infiltrative processes, the most common causes include anemia, smoking, obesity, or advancing age.  03-11-16: right tibia/fibula and ankle x-ray: unremarkable tibia, fibula and ankle   03-11-16: right foot x-ray: unremarkable foot     LABS REVIEWED:   12-14-15: vit B 12: 716;  folate 14.3; tsh 2.523; RPR; nr; chol 126; ldl 85; trig 49; hdl 31 12-15-15: wbc 5.6; hgb 12.3; hct 38.8; mcv 100.5; plt 124; glucose 102; bun 8; creat 0.69; k+ 4.0; na++ 135  01-18-16: wbc 5.7; hgb 13.2; hct 41.4; mcv 99.8; plt 102; glucose 84; bun 5.0; creat 0.62; k+ 4.2; na++ 138 total bili 1.7; ast 47; albumin 3.1  02-09-16: wbc 6.9; hgb 13.7; hct 41.9; mcv 96.9; plt 147; glucose 102; bun 8.5; creat 0.59; k+ 4.3; na++ 139;  total bili 1.7; ast 52; albumin 3.2 tsh 2.14  02-15-16: wbc 7.6; hgb 12.7; hct 38.7; mcv 96.0; plt 133; glucose 95; bun 8.7; creat 0.59; k+ 4.4; na++ 136; total bili 1.7; ast 45; albumin 3.0  02-22-16: vit B 12: 1037; folic: >20.0   Review of Systems  Constitutional: Negative for malaise/fatigue.  Respiratory: Negative for cough and shortness of breath.   Cardiovascular: Negative for chest pain, palpitations and leg swelling.  Gastrointestinal: Negative for abdominal pain, constipation and heartburn.  Musculoskeletal: no joint pain . Negative for back pain and myalgias.  Skin: has rash  Neurological: Negative for dizziness.  Psychiatric/Behavioral: The patient is not nervous/anxious.     Physical Exam  Constitutional: He is oriented to person, place, and time. No distress.  Eyes: Conjunctivae are normal.  Neck: Neck supple. No JVD present. No thyromegaly present.  Cardiovascular: Normal rate, regular rhythm and intact distal pulses.   Respiratory: Effort normal and breath sounds normal. No respiratory distress. He has no wheezes.  GI: Soft. Bowel sounds are normal. He exhibits no distension. There is no tenderness.  Musculoskeletal: He exhibits no edema.  Has right hemiparesis   Lymphadenopathy:    He has no cervical adenopathy.  Neurological: He is alert and oriented to person, place, and time.  Speech is slurred Skin: Skin is warm and dry. He is not diaphoretic. Has papular rash on scrotum papules have clear fluid present. He has had some bleeding present     Psychiatric: He has a normal mood and affect.     ASSESSMENT/ PLAN:  1. Scrotal rash will use skin barrier; will check for herpes and hiv  MD is aware of resident's narcotic use and is in agreement with current plan of care. We will attempt to wean resident as apropriate   Synthia Innocent NP [facility]  Contact [phone] Monday through Friday 8am- 5pm  After hours call [phone]

## 2016-05-30 ENCOUNTER — Encounter: Payer: Self-pay | Admitting: Adult Health

## 2016-05-30 ENCOUNTER — Non-Acute Institutional Stay (SKILLED_NURSING_FACILITY): Payer: Medicare Other | Admitting: Adult Health

## 2016-05-30 DIAGNOSIS — M87052 Idiopathic aseptic necrosis of left femur: Secondary | ICD-10-CM

## 2016-05-30 DIAGNOSIS — F333 Major depressive disorder, recurrent, severe with psychotic symptoms: Secondary | ICD-10-CM

## 2016-05-30 DIAGNOSIS — I10 Essential (primary) hypertension: Secondary | ICD-10-CM

## 2016-05-30 DIAGNOSIS — G894 Chronic pain syndrome: Secondary | ICD-10-CM | POA: Diagnosis not present

## 2016-05-30 DIAGNOSIS — Z8673 Personal history of transient ischemic attack (TIA), and cerebral infarction without residual deficits: Secondary | ICD-10-CM

## 2016-05-30 DIAGNOSIS — B182 Chronic viral hepatitis C: Secondary | ICD-10-CM | POA: Diagnosis not present

## 2016-05-30 NOTE — Progress Notes (Signed)
Location:   [facility] Room Number: 129 A Place of Service:  SNF (31)   CODE STATUS: DNR  Allergies  Allergen Reactions  . Codeine Nausea And Vomiting and Other (See Comments)    Sick on my stomach  . Sulfa Antibiotics Nausea And Vomiting and Other (See Comments)    sick    Chief Complaint  Patient presents with  . Medical Management of Chronic Issues    1 month follow up    HPI:  He is a long term resident of this facility being seen for the management of his chronic illnesses. He is spending more time in his room. He does require more nursing assistance. He is not voicing any concerns at this time.   Past Medical History:  Diagnosis Date  . Depression   . HCV (hepatitis C virus)   . Headache   . Hyperlipidemia   . Stroke [facility])     Past Surgical History:  Procedure Laterality Date  . CHOLECYSTECTOMY    . FOOT SURGERY     with metal brace in but  not any more   worn  brace on rt foot for 6 yrs    Social History   Social History  . Marital status: Single    Spouse name: N/A  . Number of children: N/A  . Years of education: N/A   Occupational History  . Not on file.   Social History Main Topics  . Smoking status: Former Smoker    Types: Cigarettes  . Smokeless tobacco: Never Used  . Alcohol use 1.2 - 1.8 oz/week    2 - 3 Cans of beer per week     Comment: * packs a week  . Drug use: No  . Sexual activity: Yes   Other Topics Concern  . Not on file   Social History Narrative  . No narrative on file   History reviewed. No pertinent family history.    VITAL SIGNS BP (!) 148/68   Pulse 68   Temp 97.8 F (36.6 C)   Resp 18   Ht 5\' 9"  (1.753 m)   Wt 154 lb 14.4 oz (70.3 kg)   SpO2 93%   BMI 22.87 kg/m   Patient's Medications  New Prescriptions   No medications on file  Previous Medications   ALENDRONATE (FOSAMAX) 70 MG TABLET    Take 70 mg by mouth once a week. Take with a full glass of water on an empty stomach.   ASPIRIN  EC 81 MG TABLET    Take 81 mg by mouth daily.   ATORVASTATIN (LIPITOR) 10 MG TABLET    Take 10 mg by mouth daily.   DIAZEPAM (VALIUM) 5 MG TABLET    Take 5 mg by mouth 2 (two) times daily.   ESCITALOPRAM (LEXAPRO) 10 MG TABLET    Take 10 mg by mouth daily.   GABAPENTIN (NEURONTIN) 100 MG CAPSULE    Take 100 mg by mouth at bedtime.   LIDOCAINE (LIDODERM) 5 %    Apply to right hip topically one time a day for 12 hours and take off   OXYCODONE (OXY IR/ROXICODONE) 5 MG IMMEDIATE RELEASE TABLET    Take 5 mg by mouth every 6 (six) hours as needed for severe pain.   OXYCODONE-ACETAMINOPHEN (PERCOCET/ROXICET) 5-325 MG TABLET    Take 1 tablet by mouth every 6 (six) hours as needed for severe pain.   POLYETHYLENE GLYCOL (MIRALAX / GLYCOLAX) PACKET    Take 17 g  by mouth daily.   PRAZOSIN (MINIPRESS) 1 MG CAPSULE    Take 1 mg by mouth at bedtime.   PROPYLENE GLYCOL (SYSTANE BALANCE OP)    Instill 1 drop in both eyes 2 times a day for dry eyes   QUETIAPINE FUMARATE (SEROQUEL XR) 150 MG 24 HR TABLET    Take 150 mg by mouth at bedtime.   SACCHAROMYCES BOULARDII (FLORASTOR) 250 MG CAPSULE    Take 250 mg by mouth daily.   SODIUM PHOSPHATE (FLEET) 7-19 GM/118ML ENEM    Place 1 enema rectally daily as needed for severe constipation.  Modified Medications   No medications on file  Discontinued Medications   No medications on file     SIGNIFICANT DIAGNOSTIC EXAMS  12-13-15: right ankle x-ray: Soft tissue swelling laterally. No fracture. Ankle mortise appears intact. 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  12-13-15: right foot x-ray: Acute fracture proximal aspect fourth proximal phalanx with slight impaction at the fracture site. No other acute fracture evident. Old trauma with remodeling involving the cuboid bone. No dislocation. No appreciable joint space narrowing.   12-13-15: pelvic x-ray: No fracture or dislocation. Mild symmetric narrowing of both hip joints. Note that on the pelvic image,  there is a lucency in the lateral left femoral head, potentially a focus of avascular necrosis. Note that MR is the imaging study of choice to assess for avascular necrosis.  12-14-15: MRI of brain: 1.  No acute intracranial abnormality. 2. 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 3. 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  12-15-15: MRI of cervical spine: Normal appearing cervical cord. No finding to explain the patient's symptoms.  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 Please see above for descriptions of individual levels. Normal appearing cervical cord. No finding to explain the patient's symptoms. 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 Please see above for descriptions of individual levels.   12-29-15: MRI of left hip 1. 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 There is internal enhancement in a trace amount of surrounding marrow edema. 2. Mild-to-moderate degenerative findings in both hips. 3. Small tear of the anterior superior acetabular labrum on the left. 4. There is some patchy muscular edema especially on the right side involving the quadriceps, gluteus minimus, and gluteus medius. This is nonspecific but could reflect a low-grade myositis or muscular strains. Some of this edema tracks deep to the right iliotibial band. 5. Marrow heterogeneity is present. Although this can be caused by marrow infiltrative processes, the most common causes include anemia, smoking, obesity, or advancing age.  03-11-16: right tibia/fibula and ankle x-ray: unremarkable tibia, fibula and ankle   03-11-16: right foot x-ray: unremarkable foot     LABS REVIEWED:   12-14-15: vit B 12:  716; folate 14.3; tsh 2.523; RPR; nr; chol 126; ldl 85; trig 49; hdl 31 12-15-15: wbc 5.6; hgb 12.3; hct 38.8; mcv 100.5; plt 124; glucose 102; bun 8; creat 0.69; k+ 4.0; na++ 135  01-18-16: wbc 5.7; hgb 13.2; hct 41.4; mcv 99.8; plt 102; glucose 84; bun 5.0; creat 0.62; k+ 4.2; na++ 138 total bili 1.7; ast 47; albumin 3.1  02-09-16: wbc 6.9; hgb 13.7; hct 41.9; mcv 96.9; plt 147; glucose 102; bun 8.5; creat 0.59;  k+ 4.3; na++ 139; total bili 1.7; ast 52; albumin 3.2 tsh 2.14  02-15-16: wbc 7.6; hgb 12.7; hct 38.7; mcv 96.0; plt 133; glucose 95; bun 8.7; creat 0.59; k+ 4.4; na++ 136; total bili 1.7; ast 45; albumin 3.0  02-22-16: vit B 12: 1037; folic: >20.0   Review of Systems  Constitutional: Negative for malaise/fatigue.  Respiratory: Negative for cough and shortness of breath.   Cardiovascular: Negative for chest pain, palpitations and leg swelling.  Gastrointestinal: Negative for abdominal pain, constipation and heartburn.  Musculoskeletal: no joint pain . Negative for back pain and myalgias.  Skin: Negative.   Neurological: Negative for dizziness.  Psychiatric/Behavioral: The patient is not nervous/anxious.     Physical Exam  Constitutional: He is oriented to person, place, and time. No distress.  Eyes: Conjunctivae are normal.  Neck: Neck supple. No JVD present. No thyromegaly present.  Cardiovascular: Normal rate, regular rhythm and intact distal pulses.   Respiratory: Effort normal and breath sounds normal. No respiratory distress. He has no wheezes.  GI: Soft. Bowel sounds are normal. He exhibits no distension. There is no tenderness.  Musculoskeletal: He exhibits no edema.  Has right hemiparesis   Lymphadenopathy:    He has no cervical adenopathy.  Neurological: He is alert and oriented to person, place, and time.  Speech is slurred Skin: Skin is warm and dry. He is not diaphoretic.  Psychiatric: He has a normal mood and affect.     ASSESSMENT/ PLAN:  1. Left hip  avascular necrosis: 1.5 x 2.4 x 0.8 cm on left hip on 12-29-15 .  Will continue  fosamax 70 mg weekly and will monitor  2. 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  Chronic hepatitis C: will continue to monitor   4. Depression: will continue lexapro 10 mg daily will continue valium 5 mg twice daily for anxiety   5. Constipation: will continue miralax daily   6. 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 Gait instability and weakness: will continue therapy as directed and will monitor   8. Hypertension: will continue minipress 1 mg nightly   Will check hgb a1c and FOBT   MD is aware of resident's narcotic use and is in agreement with current plan of care. We will attempt to wean resident as apropriate   Synthia Innocenteborah Preethi Scantlebury NP [facility]  Contact [phone] Monday through Friday 8am- 5pm  After hours call [phone]

## 2016-05-31 LAB — HEMOGLOBIN A1C: Hemoglobin A1C: 4.9

## 2016-06-27 ENCOUNTER — Non-Acute Institutional Stay (SKILLED_NURSING_FACILITY): Payer: Medicare Other | Admitting: Adult Health

## 2016-06-27 ENCOUNTER — Encounter: Payer: Self-pay | Admitting: Adult Health

## 2016-06-27 DIAGNOSIS — E785 Hyperlipidemia, unspecified: Secondary | ICD-10-CM

## 2016-06-27 DIAGNOSIS — B182 Chronic viral hepatitis C: Secondary | ICD-10-CM

## 2016-06-27 DIAGNOSIS — G894 Chronic pain syndrome: Secondary | ICD-10-CM | POA: Diagnosis not present

## 2016-06-27 DIAGNOSIS — M87052 Idiopathic aseptic necrosis of left femur: Secondary | ICD-10-CM

## 2016-06-27 DIAGNOSIS — I10 Essential (primary) hypertension: Secondary | ICD-10-CM | POA: Diagnosis not present

## 2016-06-27 DIAGNOSIS — F333 Major depressive disorder, recurrent, severe with psychotic symptoms: Secondary | ICD-10-CM | POA: Diagnosis not present

## 2016-06-27 DIAGNOSIS — G8191 Hemiplegia, unspecified affecting right dominant side: Secondary | ICD-10-CM | POA: Diagnosis not present

## 2016-06-27 NOTE — Progress Notes (Signed)
Location:   [facility] Room Number: 119 B Place of Service:  SNF (31)   CODE STATUS: DNR  Allergies  Allergen Reactions  . Codeine Nausea And Vomiting and Other (See Comments)    Sick on my stomach  . Sulfa Antibiotics Nausea And Vomiting and Other (See Comments)    sick    Chief Complaint  Patient presents with  . Medical Management of Chronic Issues    1 month follow up    HPI:  He is a long term resident of this facility being seen for the management of his chronic illnesses. He does spend most of his time in his bed per his choice. Overall there is little change in his status.  There are no nursing concerns at this time.   Past Medical History:  Diagnosis Date  . Depression   . HCV (hepatitis C virus)   . Headache   . Hyperlipidemia   . Stroke [facility])     Past Surgical History:  Procedure Laterality Date  . CHOLECYSTECTOMY    . FOOT SURGERY     with metal brace in but  not any more   worn  brace on rt foot for 6 yrs    Social History   Social History  . Marital status: Single    Spouse name: N/A  . Number of children: N/A  . Years of education: N/A   Occupational History  . Not on file.   Social History Main Topics  . Smoking status: Former Smoker    Types: Cigarettes  . Smokeless tobacco: Never Used  . Alcohol use 1.2 - 1.8 oz/week    2 - 3 Cans of beer per week     Comment: * packs a week  . Drug use: No  . Sexual activity: Yes   Other Topics Concern  . Not on file   Social History Narrative  . No narrative on file   History reviewed. No pertinent family history.    VITAL SIGNS BP 128/67   Pulse 80   Temp 98 F (36.7 C)   Resp 16   Ht  (1.753 m)   Wt 154 lb 9.6 oz (70.1 kg)   SpO2 97%   BMI 22.83 kg/m   Patient's Medications  New Prescriptions   No medications on file  Previous Medications   ALENDRONATE (FOSAMAX) 70 MG TABLET    Take 70 mg by mouth once a week. Take with a full glass of water on an empty  stomach.   ASPIRIN EC 81 MG TABLET    Take 81 mg by mouth daily.   ATORVASTATIN (LIPITOR) 10 MG TABLET    Take 10 mg by mouth daily.   DIAZEPAM (VALIUM) 5 MG TABLET    Take 5 mg by mouth 2 (two) times daily.   ESCITALOPRAM (LEXAPRO) 5 MG TABLET    Take 5 mg by mouth daily.   GABAPENTIN (NEURONTIN) 100 MG CAPSULE    Take 100 mg by mouth at bedtime.   LIDOCAINE (LIDODERM) 5 %    Apply to right hip topically one time a day for 12 hours and take off   OXYCODONE (OXY IR/ROXICODONE) 5 MG IMMEDIATE RELEASE TABLET    Take 5 mg by mouth every 6 (six) hours as needed for severe pain.   OXYCODONE-ACETAMINOPHEN (PERCOCET/ROXICET) 5-325 MG TABLET    Take 1 tablet by mouth every 6 (six) hours as needed for severe pain.   POLYETHYLENE GLYCOL (MIRALAX / GLYCOLAX) PACKET  Take 17 g by mouth daily.   PRAZOSIN (MINIPRESS) 1 MG CAPSULE    Take 1 mg by mouth at bedtime.   PROPYLENE GLYCOL (SYSTANE BALANCE OP)    Instill 1 drop in both eyes 2 times a day for dry eyes   QUETIAPINE FUMARATE (SEROQUEL XR) 150 MG 24 HR TABLET    Take 150 mg by mouth at bedtime.   SACCHAROMYCES BOULARDII (FLORASTOR) 250 MG CAPSULE    Take 250 mg by mouth daily.   SODIUM PHOSPHATE (FLEET) 7-19 GM/118ML ENEM    Place 1 enema rectally daily as needed for severe constipation.  Modified Medications   No medications on file  Discontinued Medications   ESCITALOPRAM (LEXAPRO) 10 MG TABLET    Take 10 mg by mouth daily.     SIGNIFICANT DIAGNOSTIC EXAMS  12-13-15: right ankle x-ray: Soft tissue swelling laterally. No fracture. Ankle mortise appears intact. 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  12-13-15: right foot x-ray: Acute fracture proximal aspect fourth proximal phalanx with slight impaction at the fracture site. No other acute fracture evident. Old trauma with remodeling involving the cuboid bone. No dislocation. No appreciable joint space narrowing.   12-13-15: pelvic x-ray: No fracture or dislocation. Mild symmetric  narrowing of both hip joints. Note that on the pelvic image, there is a lucency in the lateral left femoral head, potentially a focus of avascular necrosis. Note that MR is the imaging study of choice to assess for avascular necrosis.  12-14-15: MRI of brain: 1.  No acute intracranial abnormality. 2. 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 3. 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  12-15-15: MRI of cervical spine: Normal appearing cervical cord. No finding to explain the patient's symptoms.  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 Please see above for descriptions of individual levels. Normal appearing cervical cord. No finding to explain the patient's symptoms. 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 Please see above for descriptions of individual levels.   12-29-15: MRI of left hip 1. 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 There is internal enhancement in a trace amount of surrounding marrow edema. 2. Mild-to-moderate degenerative findings in both hips. 3. Small tear of the anterior superior acetabular labrum on the left. 4. There is some patchy muscular edema especially on the right side involving the quadriceps, gluteus minimus, and gluteus medius. This is nonspecific but could reflect a low-grade myositis or muscular strains. Some of this edema tracks deep to the right iliotibial band. 5. Marrow heterogeneity is present. Although this can be caused by marrow infiltrative processes, the most common causes include anemia, smoking, obesity, or advancing age.  03-11-16: right tibia/fibula and ankle x-ray: unremarkable tibia, fibula and ankle   03-11-16: right foot x-ray:  unremarkable foot     LABS REVIEWED:   12-14-15: vit B 12: 716; folate 14.3; tsh 2.523; RPR; nr; chol 126; ldl 85; trig 49; hdl 31 12-15-15: wbc 5.6; hgb 12.3; hct 38.8; mcv 100.5; plt 124; glucose 102; bun 8; creat 0.69; k+ 4.0; na++ 135  01-18-16: wbc 5.7; hgb 13.2; hct 41.4; mcv 99.8; plt 102; glucose 84; bun 5.0; creat 0.62; k+ 4.2; na++ 138 total bili 1.7; ast 47; albumin 3.1  02-09-16: wbc 6.9; hgb  13.7; hct 41.9; mcv 96.9; plt 147; glucose 102; bun 8.5; creat 0.59; k+ 4.3; na++ 139; total bili 1.7; ast 52; albumin 3.2 tsh 2.14  02-15-16: wbc 7.6; hgb 12.7; hct 38.7; mcv 96.0; plt 133; glucose 95; bun 8.7; creat 0.59; k+ 4.4; na++ 136; total bili 1.7; ast 45; albumin 3.0  02-22-16: vit B 12: 1037; folic: >20.0 05-31-16: hgb a1c 4.9    Review of Systems  Constitutional: Negative for malaise/fatigue.  Respiratory: Negative for cough and shortness of breath.   Cardiovascular: Negative for chest pain, palpitations and leg swelling.  Gastrointestinal: Negative for abdominal pain, constipation and heartburn.  Musculoskeletal: no joint pain . Negative for back pain and myalgias.  Skin: Negative.   Neurological: Negative for dizziness.  Psychiatric/Behavioral: The patient is not nervous/anxious.     Physical Exam  Constitutional: He is oriented to person, place, and time. No distress.  Eyes: Conjunctivae are normal.  Neck: Neck supple. No JVD present. No thyromegaly present.  Cardiovascular: Normal rate, regular rhythm and intact distal pulses.   Respiratory: Effort normal and breath sounds normal. No respiratory distress. He has no wheezes.  GI: Soft. Bowel sounds are normal. He exhibits no distension. There is no tenderness.  Musculoskeletal: He exhibits no edema.  Has right hemiparesis   Lymphadenopathy:    He has no cervical adenopathy.  Neurological: He is alert and oriented to person, place, and time.  Skin: Skin is warm and dry. He is not diaphoretic.  Psychiatric: He has a  normal mood and affect.     ASSESSMENT/ PLAN:  1. Left hip avascular necrosis: 1.5 x 2.4 x 0.8 cm on left hip on 12-29-15 .  Will continue  fosamax 70 mg weekly and will monitor  2. 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  Chronic hepatitis C: will continue to monitor   4. Depression: will continue lexapro 5 mg daily will continue valium 5 mg twice daily for anxiety   5. Constipation: will continue miralax daily   6. Psychosis: had suicidal ideation in the past: will continue seroquel xr 150 mg nightly   7. Hypertension: will continue minipress 1 mg nightly   8. Chronic pain: right hip: will continue lidoderm patch to right hip daily   9. Dyslipidemia: ldl 85 will continue lipitor 10 mg daily      MD is aware of resident's narcotic use and is in agreement with current plan of care. We will attempt to wean resident as apropriate   Synthia Innocent NP [facility]  Contact [phone] Monday through Friday 8am- 5pm  After hours call [phone]

## 2016-07-14 DIAGNOSIS — E785 Hyperlipidemia, unspecified: Secondary | ICD-10-CM | POA: Insufficient documentation

## 2016-07-31 ENCOUNTER — Non-Acute Institutional Stay (SKILLED_NURSING_FACILITY): Payer: Medicare Other | Admitting: Adult Health

## 2016-07-31 ENCOUNTER — Encounter: Payer: Self-pay | Admitting: Adult Health

## 2016-07-31 DIAGNOSIS — B182 Chronic viral hepatitis C: Secondary | ICD-10-CM

## 2016-07-31 DIAGNOSIS — F333 Major depressive disorder, recurrent, severe with psychotic symptoms: Secondary | ICD-10-CM | POA: Diagnosis not present

## 2016-07-31 DIAGNOSIS — M87052 Idiopathic aseptic necrosis of left femur: Secondary | ICD-10-CM | POA: Diagnosis not present

## 2016-07-31 DIAGNOSIS — I10 Essential (primary) hypertension: Secondary | ICD-10-CM | POA: Diagnosis not present

## 2016-07-31 DIAGNOSIS — G894 Chronic pain syndrome: Secondary | ICD-10-CM | POA: Diagnosis not present

## 2016-07-31 DIAGNOSIS — G8191 Hemiplegia, unspecified affecting right dominant side: Secondary | ICD-10-CM | POA: Diagnosis not present

## 2016-07-31 NOTE — Progress Notes (Signed)
Location:   [facility] Room Number: 119 B Place of Service:       CODE STATUS: DNR  Allergies  Allergen Reactions  . Codeine Nausea And Vomiting and Other (See Comments)    Sick on my stomach  . Sulfa Antibiotics Nausea And Vomiting and Other (See Comments)    sick  . Lexapro [Escitalopram Oxalate] Other (See Comments)    Unknown Reaction    Chief Complaint  Patient presents with  . Medical Management of Chronic Issues    1 month follow up    HPI:  He  is a long term resident of this facility being seen for the management of her chronic illnesses. Overall there is little change in his overall status. There are no nursing concerns at this time.    Past Medical History:  Diagnosis Date  . Depression   . HCV (hepatitis C virus)   . Headache   . Hyperlipidemia   . Stroke [facility])     Past Surgical History:  Procedure Laterality Date  . CHOLECYSTECTOMY    . FOOT SURGERY     with metal brace in but  not any more   worn  brace on rt foot for 6 yrs    Social History   Social History  . Marital status: Single    Spouse name: N/A  . Number of children: N/A  . Years of education: N/A   Occupational History  . Not on file.   Social History Main Topics  . Smoking status: Former Smoker    Types: Cigarettes  . Smokeless tobacco: Never Used  . Alcohol use 1.2 - 1.8 oz/week    2 - 3 Cans of beer per week     Comment: * packs a week  . Drug use: No  . Sexual activity: Yes   Other Topics Concern  . Not on file   Social History Narrative  . No narrative on file   History reviewed. No pertinent family history.    VITAL SIGNS BP 110/62   Pulse 75   Temp 97.8 F (36.6 C)   Resp 13   Ht 5\' 9"  (1.753 m)   Wt 155 lb 1.6 oz (70.4 kg)   SpO2 96%   BMI 22.90 kg/m   Patient's Medications  New Prescriptions   No medications on file  Previous Medications   ALENDRONATE (FOSAMAX) 70 MG TABLET    Take 70 mg by mouth once a week. Take with a full  glass of water on an empty stomach.   ASPIRIN EC 81 MG TABLET    Take 81 mg by mouth daily.   ATORVASTATIN (LIPITOR) 10 MG TABLET    Take 10 mg by mouth daily.   DIAZEPAM (VALIUM) 5 MG TABLET    Give 2.5 mg by mouth in the morning and 1 tablet (5 mg) by mouth at bedtime   GABAPENTIN (NEURONTIN) 100 MG CAPSULE    Take 100 mg by mouth at bedtime.   LIDOCAINE 4 % PTCH    Apply topically. Apply to both hips in th morning   ONDANSETRON (ZOFRAN) 4 MG TABLET    Take 4 mg by mouth every 8 (eight) hours as needed for nausea or vomiting.   OXYCODONE (OXY IR/ROXICODONE) 5 MG IMMEDIATE RELEASE TABLET    Take 5 mg by mouth every 6 (six) hours as needed for severe pain.   POLYETHYLENE GLYCOL (MIRALAX / GLYCOLAX) PACKET    Take 17 g by mouth daily.  PRAZOSIN (MINIPRESS) 1 MG CAPSULE    Take 1 mg by mouth at bedtime.   PROPYLENE GLYCOL (SYSTANE BALANCE OP)    Instill 1 drop in both eyes 2 times a day for dry eyes   SACCHAROMYCES BOULARDII (FLORASTOR) 250 MG CAPSULE    Take 250 mg by mouth daily.   SODIUM PHOSPHATE (FLEET) 7-19 GM/118ML ENEM    Place 1 enema rectally daily as needed for severe constipation.  Modified Medications   No medications on file  Discontinued Medications   ESCITALOPRAM (LEXAPRO) 5 MG TABLET    Take 5 mg by mouth daily.   LIDOCAINE (LIDODERM) 5 %    Apply to right hip topically one time a day for 12 hours and take off   OXYCODONE-ACETAMINOPHEN (PERCOCET/ROXICET) 5-325 MG TABLET    Take 1 tablet by mouth every 6 (six) hours as needed for severe pain.   QUETIAPINE FUMARATE ER PO    Take 12.5 mg by mouth at bedtime.      SIGNIFICANT DIAGNOSTIC EXAMS  12-13-15: right ankle x-ray: Soft tissue swelling laterally. No fracture. Ankle mortise appears intact. 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  12-13-15: right foot x-ray: Acute fracture proximal aspect fourth proximal phalanx with slight impaction at the fracture site. No other acute fracture evident. Old trauma with  remodeling involving the cuboid bone. No dislocation. No appreciable joint space narrowing.   12-13-15: pelvic x-ray: No fracture or dislocation. Mild symmetric narrowing of both hip joints. Note that on the pelvic image, there is a lucency in the lateral left femoral head, potentially a focus of avascular necrosis. Note that MR is the imaging study of choice to assess for avascular necrosis.  12-14-15: MRI of brain: 1.  No acute intracranial abnormality. 2. 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 3. 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  12-15-15: MRI of cervical spine: Normal appearing cervical cord. No finding to explain the patient's symptoms.  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 Please see above for descriptions of individual levels. Normal appearing cervical cord. No finding to explain the patient's symptoms. 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 Please see above for descriptions of individual levels.   12-29-15: MRI of left hip 1. 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 There is internal enhancement in a trace amount of surrounding marrow edema. 2. Mild-to-moderate degenerative findings in both hips. 3. Small tear of the anterior superior acetabular labrum on the left. 4. There is some patchy muscular edema especially on the right side involving the quadriceps, gluteus minimus, and gluteus medius. This is nonspecific but could reflect a low-grade myositis or muscular strains. Some of this edema tracks deep to the right iliotibial band. 5. Marrow heterogeneity is present. Although this can be caused by marrow infiltrative processes, the most common causes include  anemia, smoking, obesity, or advancing age.  03-11-16: right tibia/fibula and ankle x-ray: unremarkable tibia, fibula and ankle   03-11-16: right foot x-ray: unremarkable foot     LABS REVIEWED:   12-14-15: vit B 12: 716; folate 14.3; tsh 2.523; RPR; nr; chol 126; ldl 85; trig 49; hdl 31 12-15-15: wbc 5.6; hgb 12.3; hct 38.8; mcv 100.5; plt 124; glucose 102; bun 8; creat 0.69; k+ 4.0;  na++ 135  01-18-16: wbc 5.7; hgb 13.2; hct 41.4; mcv 99.8; plt 102; glucose 84; bun 5.0; creat 0.62; k+ 4.2; na++ 138 total bili 1.7; ast 47; albumin 3.1  02-09-16: wbc 6.9; hgb 13.7; hct 41.9; mcv 96.9; plt 147; glucose 102; bun 8.5; creat 0.59; k+ 4.3; na++ 139; total bili 1.7; ast 52; albumin 3.2 tsh 2.14  02-15-16: wbc 7.6; hgb 12.7; hct 38.7; mcv 96.0; plt 133; glucose 95; bun 8.7; creat 0.59; k+ 4.4; na++ 136; total bili 1.7; ast 45; albumin 3.0  02-22-16: vit B 12: 1037; folic: >20.0 05-31-16: hgb a1c 4.9    Review of Systems  Constitutional: Negative for malaise/fatigue.  Respiratory: Negative for cough and shortness of breath.   Cardiovascular: Negative for chest pain, palpitations and leg swelling.  Gastrointestinal: Negative for abdominal pain, constipation and heartburn.  Musculoskeletal: no joint pain . Negative for back pain and myalgias.  Skin: Negative.   Neurological: Negative for dizziness.  Psychiatric/Behavioral: The patient is not nervous/anxious.     Physical Exam  Constitutional: He is oriented to person, place, and time. No distress.  Eyes: Conjunctivae are normal.  Neck: Neck supple. No JVD present. No thyromegaly present.  Cardiovascular: Normal rate, regular rhythm and intact distal pulses.   Respiratory: Effort normal and breath sounds normal. No respiratory distress. He has no wheezes.  GI: Soft. Bowel sounds are normal. He exhibits no distension. There is no tenderness.  Musculoskeletal: He exhibits no edema.  Has right hemiparesis   Lymphadenopathy:    He has no cervical  adenopathy.  Neurological: He is alert and oriented to person, place, and time.  Skin: Skin is warm and dry. He is not diaphoretic.  Psychiatric: He has a normal mood and affect.     ASSESSMENT/ PLAN:  1. Left hip avascular necrosis: 1.5 x 2.4 x 0.8 cm on left hip on 12-29-15 .  Will continue  fosamax 70 mg weekly and will monitor  2. 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  Chronic hepatitis C: will continue to monitor   4. Depression: will continue lexapro 5 mg daily will continue valium 5 mg twice daily for anxiety   5. Constipation: will continue miralax daily   6. Psychosis: had suicidal ideation in the past: will continue seroquel xr 150 mg nightly   7. Hypertension: will continue minipress 1 mg nightly   8. Chronic pain: right hip: will continue lidoderm patch to right hip daily   9. Dyslipidemia: ldl 85 will continue lipitor 10 mg daily      Synthia Innocent NP [facility]  Contact [phone] Monday through Friday 8am- 5pm  After hours call [phone]

## 2016-08-03 ENCOUNTER — Non-Acute Institutional Stay (SKILLED_NURSING_FACILITY): Payer: Medicare Other | Admitting: Adult Health

## 2016-08-03 ENCOUNTER — Encounter: Payer: Self-pay | Admitting: Adult Health

## 2016-08-03 DIAGNOSIS — G894 Chronic pain syndrome: Secondary | ICD-10-CM | POA: Diagnosis not present

## 2016-08-03 DIAGNOSIS — M87052 Idiopathic aseptic necrosis of left femur: Secondary | ICD-10-CM

## 2016-08-03 NOTE — Progress Notes (Signed)
Location:   [facility] Room Number: 119 B Place of Service:  SNF (31)   CODE STATUS: DNR  Allergies  Allergen Reactions  . Codeine Nausea And Vomiting and Other (See Comments)    Sick on my stomach  . Sulfa Antibiotics Nausea And Vomiting and Other (See Comments)    sick  . Lexapro [Escitalopram Oxalate] Other (See Comments)    Unknown Reaction    Chief Complaint  Patient presents with  . Acute Visit    Hip Pain    HPI:  Staff reports that he is complaining of left hip pain and a headache on a daily basis. He tells me that his left hip hurts but that he is NOT having headaches. He tells me that the hip pain is daily and is all the time and that he is not getting adequate relief.    Past Medical History:  Diagnosis Date  . Depression   . HCV (hepatitis C virus)   . Headache   . Hyperlipidemia   . Stroke [facility](HCC)     Past Surgical History:  Procedure Laterality Date  . CHOLECYSTECTOMY    . FOOT SURGERY     with metal brace in but  not any more   worn  brace on rt foot for 6 yrs    Social History   Social History  . Marital status: Single    Spouse name: N/A  . Number of children: N/A  . Years of education: N/A   Occupational History  . Not on file.   Social History Main Topics  . Smoking status: Former Smoker    Types: Cigarettes  . Smokeless tobacco: Never Used  . Alcohol use 1.2 - 1.8 oz/week    2 - 3 Cans of beer per week     Comment: * packs a week  . Drug use: No  . Sexual activity: Yes   Other Topics Concern  . Not on file   Social History Narrative  . No narrative on file   History reviewed. No pertinent family history.    VITAL SIGNS BP (!) 159/76   Pulse 78   Temp 97.3 F (36.3 C)   Ht 5\' 9"  (1.753 m)   Wt 155 lb 1.6 oz (70.4 kg)   SpO2 94%   BMI 22.90 kg/m   Patient's Medications  New Prescriptions   No medications on file  Previous Medications   ALENDRONATE (FOSAMAX) 70 MG TABLET    Take 70 mg by mouth once  a week. Take with a full glass of water on an empty stomach.   ASPIRIN EC 81 MG TABLET    Take 81 mg by mouth daily.   ATORVASTATIN (LIPITOR) 10 MG TABLET    Take 10 mg by mouth daily.   DIAZEPAM (VALIUM) 5 MG TABLET    Give 2.5 mg by mouth in the morning and 1 tablet (5 mg) by mouth at bedtime   GABAPENTIN (NEURONTIN) 100 MG CAPSULE    Take 100 mg by mouth at bedtime.   ONDANSETRON (ZOFRAN) 4 MG TABLET    Take 4 mg by mouth every 8 (eight) hours as needed for nausea or vomiting.   OXYCODONE (OXY IR/ROXICODONE) 5 MG IMMEDIATE RELEASE TABLET    Take 5 mg by mouth every 6 (six) hours as needed for severe pain.   POLYETHYLENE GLYCOL (MIRALAX / GLYCOLAX) PACKET    Take 17 g by mouth daily.   PRAZOSIN (MINIPRESS) 1 MG CAPSULE  Take 1 mg by mouth at bedtime.   PROPYLENE GLYCOL (SYSTANE BALANCE OP)    Instill 1 drop in both eyes 2 times a day for dry eyes   SACCHAROMYCES BOULARDII (FLORASTOR) 250 MG CAPSULE    Take 250 mg by mouth daily.   SODIUM PHOSPHATE (FLEET) 7-19 GM/118ML ENEM    Place 1 enema rectally daily as needed for severe constipation.  Modified Medications   No medications on file  Discontinued Medications   LIDOCAINE (LIDODERM) 5 %    Apply to right hip topically one time a day for 12 hours and take off   ONDANSETRON (ZOFRAN) 4 MG TABLET    Take 4 mg by mouth every 8 (eight) hours as needed for nausea or vomiting.   QUETIAPINE FUMARATE ER PO    Take 12.5 mg by mouth at bedtime.      SIGNIFICANT DIAGNOSTIC EXAMS   12-13-15: right ankle x-ray: Soft tissue swelling laterally. No fracture. Ankle mortise appears intact. 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  12-13-15: right foot x-ray: Acute fracture proximal aspect fourth proximal phalanx with slight impaction at the fracture site. No other acute fracture evident. Old trauma with remodeling involving the cuboid bone. No dislocation. No appreciable joint space narrowing.   12-13-15: pelvic x-ray: No fracture or  dislocation. Mild symmetric narrowing of both hip joints. Note that on the pelvic image, there is a lucency in the lateral left femoral head, potentially a focus of avascular necrosis. Note that MR is the imaging study of choice to assess for avascular necrosis.  12-14-15: MRI of brain: 1.  No acute intracranial abnormality. 2. 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 3. 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  12-15-15: MRI of cervical spine: Normal appearing cervical cord. No finding to explain the patient's symptoms.  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 Please see above for descriptions of individual levels. Normal appearing cervical cord. No finding to explain the patient's symptoms. 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 Please see above for descriptions of individual levels.   12-29-15: MRI of left hip 1. 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 There is internal enhancement in a trace amount of surrounding marrow edema. 2. Mild-to-moderate degenerative findings in both hips. 3. Small tear of the anterior superior acetabular labrum on the left. 4. There is some patchy muscular edema especially on the right side involving the quadriceps, gluteus minimus, and gluteus medius. This is nonspecific but could reflect a low-grade myositis or muscular strains. Some of this edema tracks deep to the right iliotibial band. 5. Marrow heterogeneity is present. Although this can be caused by marrow infiltrative processes, the most common causes include anemia, smoking, obesity, or advancing age.  03-11-16: right tibia/fibula and ankle x-ray: unremarkable tibia, fibula and ankle    03-11-16: right foot x-ray: unremarkable foot     LABS REVIEWED:   12-14-15: vit B 12: 716; folate 14.3; tsh 2.523; RPR; nr; chol 126; ldl 85; trig 49; hdl 31 12-15-15: wbc 5.6; hgb 12.3; hct 38.8; mcv 100.5; plt 124; glucose 102; bun 8; creat 0.69; k+ 4.0; na++ 135  01-18-16: wbc 5.7; hgb 13.2; hct 41.4; mcv 99.8; plt 102; glucose 84; bun 5.0; creat 0.62; k+ 4.2;  na++ 138 total bili 1.7; ast 47; albumin 3.1  02-09-16: wbc 6.9; hgb 13.7; hct 41.9; mcv 96.9; plt 147; glucose 102; bun 8.5; creat 0.59; k+ 4.3; na++ 139; total bili 1.7; ast 52; albumin 3.2 tsh 2.14  02-15-16: wbc 7.6; hgb 12.7; hct 38.7; mcv 96.0; plt 133; glucose 95; bun 8.7; creat 0.59; k+ 4.4; na++ 136; total bili 1.7; ast 45; albumin 3.0  02-22-16: vit B 12: 1037; folic: >20.0 05-31-16: hgb a1c 4.9    Review of Systems  Constitutional: Negative for malaise/fatigue.  Respiratory: Negative for cough and shortness of breath.   Cardiovascular: Negative for chest pain, palpitations and leg swelling.  Gastrointestinal: Negative for abdominal pain, constipation and heartburn.  Musculoskeletal: has left hip pain  Skin: Negative.   Neurological: Negative for dizziness.  Psychiatric/Behavioral: The patient is not nervous/anxious.     Physical Exam  Constitutional: He is oriented to person, place, and time. No distress.  Eyes: Conjunctivae are normal.  Neck: Neck supple. No JVD present. No thyromegaly present.  Cardiovascular: Normal rate, regular rhythm and intact distal pulses.   Respiratory: Effort normal and breath sounds normal. No respiratory distress. He has no wheezes.  GI: Soft. Bowel sounds are normal. He exhibits no distension. There is no tenderness.  Musculoskeletal: He exhibits no edema.  Has right hemiparesis   Lymphadenopathy:    He has no cervical adenopathy.  Neurological: He is alert and oriented to person, place, and time.  Skin: Skin is warm and dry. He is not diaphoretic.  Psychiatric: He has a normal  mood and affect.     ASSESSMENT/ PLAN:  1. Left hip avascular necrosis: 1.5 x 2.4 x 0.8 cm on left hip on 12-29-15 .  Will continue  fosamax 70 mg weekly and will monitor  8. Chronic pain: right hip: now with left hip pain: will use lidoderm 4% patch to both hips on in the AM and off in the PM     MD is aware of resident's narcotic use and is in agreement with current plan of care. We will attempt to wean resident as apropriate   Synthia Innocent NP [facility]  Contact [phone] Monday through Friday 8am- 5pm  After hours call [phone]

## 2016-08-09 ENCOUNTER — Encounter: Payer: Self-pay | Admitting: Internal Medicine

## 2016-08-09 ENCOUNTER — Non-Acute Institutional Stay (SKILLED_NURSING_FACILITY): Payer: Medicare Other | Admitting: Internal Medicine

## 2016-08-09 DIAGNOSIS — M25551 Pain in right hip: Secondary | ICD-10-CM | POA: Diagnosis not present

## 2016-08-09 DIAGNOSIS — G894 Chronic pain syndrome: Secondary | ICD-10-CM

## 2016-08-09 NOTE — Progress Notes (Signed)
Patient ID: Neil Lambert, male   DOB: [birth year], 71 y.o.   MRN: [id number]    DATE:  08/09/2016  Location:    [facility] Room Number: 119 B Place of Service: SNF (31)   Extended Emergency Contact Information Primary Emergency Contact: OTT,CHARLES Address: [address] [place]  [address] Home Phone: [phone] Relation: None Secondary Emergency Contact: Johna Sheriff [place] Home Phone: [phone] Relation: Significant other  Advanced Directive information Does Patient Have a Medical Advance Directive?: Yes, Type of Advance Directive: Out of facility DNR (pink MOST or yellow form), Pre-existing out of facility DNR order (yellow form or pink MOST form): Yellow form placed in chart (order not valid for inpatient use);Pink MOST form placed in chart (order not valid for inpatient use), Does patient want to make changes to medical advance directive?: No - Patient declined  Chief Complaint  Patient presents with  . Acute Visit    Hip Pain    HPI:  71 yo male seen today for right hip pain. He reports > 10 yr hx right hip pain. He was seen by pain specialist in past as well as chiropractor. He has tried PT but it was too painful. Ortho at [facility] Dr Roda Shutters in the past requested sx eval. No f/c. He has hx falls. He recently started lidoderm patch 4% to both hips. He is also c/a lipitor causing strange dreams.   Left hip avascular necrosis - 1.5 x 2.4 x 0.8 cm on left hip on 12-29-15; takes  fosamax 70 mg weekly   Chronic pain of right hip - suboptimally controlled  Dyslipidemia - takes lipitor 10 mg daily. LDL 85   Past Medical History:  Diagnosis Date  . Depression   . HCV (hepatitis C virus)   . Headache   . Hyperlipidemia   . Stroke [facility])     Past Surgical History:  Procedure Laterality Date  . CHOLECYSTECTOMY    . FOOT SURGERY     with metal brace in but  not any more   worn  brace on rt foot for 6 yrs    Patient Care  Team: Jarome Matin, MD as PCP - General (Internal Medicine)  Social History   Social History  . Marital status: Single    Spouse name: N/A  . Number of children: N/A  . Years of education: N/A   Occupational History  . Not on file.   Social History Main Topics  . Smoking status: Former Smoker    Types: Cigarettes  . Smokeless tobacco: Never Used  . Alcohol use 1.2 - 1.8 oz/week    2 - 3 Cans of beer per week     Comment: * packs a week  . Drug use: No  . Sexual activity: Yes   Other Topics Concern  . Not on file   Social History Narrative  . No narrative on file     reports that he has quit smoking. His smoking use included Cigarettes. He has never used smokeless tobacco. He reports that he drinks about 1.2 - 1.8 oz of alcohol per week . He reports that he does not use drugs.  History reviewed. No pertinent family history. No family status information on file.    Immunization History  Administered Date(s) Administered  . PPD Test 12/28/2015    Allergies  Allergen Reactions  . Codeine Nausea And Vomiting and Other (See Comments)  Sick on my stomach  . Sulfa Antibiotics Nausea And Vomiting and Other (See Comments)    sick  . Lexapro [Escitalopram Oxalate] Other (See Comments)    Unknown Reaction    Medications: Patient's Medications  New Prescriptions   No medications on file  Previous Medications   ALENDRONATE (FOSAMAX) 70 MG TABLET    Take 70 mg by mouth once a week. Take with a full glass of water on an empty stomach.   ASPIRIN EC 81 MG TABLET    Take 81 mg by mouth daily.   ATORVASTATIN (LIPITOR) 10 MG TABLET    Take 10 mg by mouth daily.   DIAZEPAM (VALIUM) 5 MG TABLET    Give 2.5 mg by mouth in the morning and 1 tablet (5 mg) by mouth at bedtime   GABAPENTIN (NEURONTIN) 100 MG CAPSULE    Take 100 mg by mouth at bedtime.   LIDOCAINE 4 % PTCH    Apply topically. Apply to both hips in th morning   ONDANSETRON (ZOFRAN) 4 MG TABLET    Take 4 mg by  mouth every 8 (eight) hours as needed for nausea or vomiting.   OXYCODONE (OXY IR/ROXICODONE) 5 MG IMMEDIATE RELEASE TABLET    Take 5 mg by mouth every 6 (six) hours as needed for severe pain.   POLYETHYLENE GLYCOL (MIRALAX / GLYCOLAX) PACKET    Take 17 g by mouth daily.   PRAZOSIN (MINIPRESS) 1 MG CAPSULE    Take 1 mg by mouth at bedtime.   PROPYLENE GLYCOL (SYSTANE BALANCE OP)    Instill 1 drop in both eyes 2 times a day for dry eyes   SACCHAROMYCES BOULARDII (FLORASTOR) 250 MG CAPSULE    Take 250 mg by mouth daily.   SODIUM PHOSPHATE (FLEET) 7-19 GM/118ML ENEM    Place 1 enema rectally daily as needed for severe constipation.  Modified Medications   No medications on file  Discontinued Medications   LIDOCAINE (LIDODERM) 5 %    Apply to right hip topically one time a day for 12 hours and take off   ONDANSETRON (ZOFRAN) 4 MG TABLET    Take 4 mg by mouth every 8 (eight) hours as needed for nausea or vomiting.   QUETIAPINE FUMARATE ER PO    Take 12.5 mg by mouth at bedtime.     Review of Systems  Unable to perform ROS: Psychiatric disorder    Vitals:   08/09/16 1226  BP: 130/66  Pulse: 69  Resp: 20  Temp: 97.1 F (36.2 C)  TempSrc: Oral  SpO2: 96%  Weight: 155 lb 1.6 oz (70.4 kg)  Height: 5\' 9"  (1.753 m)   Body mass index is 22.9 kg/m.  Physical Exam  Constitutional: He appears well-developed.  Sitting in w/c in NAD, frail appearing  Cardiovascular:  No LE edema b/l. No calf TTP  Musculoskeletal: He exhibits deformity (RUE contracture - flexion at wrist).  Neurological: He is alert.  Right hemiplegia and right hemiparesis and right foot drop  Skin: Skin is warm and dry. No rash noted.  Psychiatric: He has a normal mood and affect. His behavior is normal. His speech is slurred.     Labs reviewed: Nursing Home on 06/27/2016  Component Date Value Ref Range Status  . Hemoglobin A1C 05/31/2016 4.9   Final  Nursing Home on 05/30/2016  Component Date Value Ref Range Status   . Hemoglobin 02/15/2016 12.7* 13.5 - 17.5 g/dL Final  . HCT 04/54/098112/08/2015 39* 41 - 53 %  Final  . Neutrophils Absolute 02/15/2016 35  /L Final  . Platelets 02/15/2016 133* 150 - 399 K/L Final  . WBC 02/15/2016 7.6  10^3/mL Final  . Glucose 02/15/2016 95  mg/dL Final  . BUN 78/29/5621 9  4 - 21 mg/dL Final  . Creatinine 30/86/5784 0.6  0.6 - 1.3 mg/dL Final  . Potassium 69/62/9528 4.4  3.4 - 5.3 mmol/L Final  . Sodium 02/15/2016 136* 137 - 147 mmol/L Final  . Alkaline Phosphatase 02/15/2016 97  25 - 125 U/L Final  . ALT 02/15/2016 18  10 - 40 U/L Final  . AST 02/15/2016 45* 14 - 40 U/L Final  . Bilirubin, Total 02/15/2016 1.7  mg/dL Final  . Vitamin U-13 24/40/1027 1037   Final    No results found.   Assessment/Plan   ICD-10-CM   1. Chronic pain syndrome G89.4   2. Right hip pain M25.551    Refer to Ortho for 2nd opinion  Cont pain meds as ordered  Change lipitor to qMWF 2/2 ADRs  Follow lipid panel  Will follow   Charvi Gammage S. Ancil Linsey  [facility] [address] [place], [place] [address] [phone] Cell (Monday-Friday 8 AM - 5 PM) [phone] After 5 PM and follow prompts

## 2016-08-28 ENCOUNTER — Encounter: Payer: Self-pay | Admitting: Internal Medicine

## 2016-08-28 ENCOUNTER — Non-Acute Institutional Stay (SKILLED_NURSING_FACILITY): Payer: Medicare Other | Admitting: Internal Medicine

## 2016-08-28 DIAGNOSIS — E785 Hyperlipidemia, unspecified: Secondary | ICD-10-CM | POA: Diagnosis not present

## 2016-08-28 DIAGNOSIS — F333 Major depressive disorder, recurrent, severe with psychotic symptoms: Secondary | ICD-10-CM | POA: Diagnosis not present

## 2016-08-28 DIAGNOSIS — F4323 Adjustment disorder with mixed anxiety and depressed mood: Secondary | ICD-10-CM

## 2016-08-28 DIAGNOSIS — M25552 Pain in left hip: Secondary | ICD-10-CM

## 2016-08-28 DIAGNOSIS — G894 Chronic pain syndrome: Secondary | ICD-10-CM | POA: Diagnosis not present

## 2016-08-28 DIAGNOSIS — F29 Unspecified psychosis not due to a substance or known physiological condition: Secondary | ICD-10-CM | POA: Diagnosis not present

## 2016-08-28 DIAGNOSIS — I10 Essential (primary) hypertension: Secondary | ICD-10-CM

## 2016-08-28 DIAGNOSIS — M87052 Idiopathic aseptic necrosis of left femur: Secondary | ICD-10-CM | POA: Diagnosis not present

## 2016-08-28 DIAGNOSIS — Z8673 Personal history of transient ischemic attack (TIA), and cerebral infarction without residual deficits: Secondary | ICD-10-CM | POA: Diagnosis not present

## 2016-08-28 NOTE — Progress Notes (Signed)
DATE: 08/28/16  Location:    [facility] Room Number: 119B Place of Service: SNF (31)   Extended Emergency Contact Information Primary Emergency Contact: OTT,CHARLES Address: [address]  [address] Home Phone: [phone] Relation: None Secondary Emergency Contact: Johna Sheriff [place] Home Phone: [phone] Relation: Significant other  Advanced Directive information Does Patient Have a Medical Advance Directive?: Yes, Type of Advance Directive: Out of facility DNR (pink MOST or yellow form), Pre-existing out of facility DNR order (yellow form or pink MOST form): Yellow form placed in chart (order not valid for inpatient use);Pink MOST form placed in chart (order not valid for inpatient use)  Chief Complaint  Patient presents with  . Medical Management of Chronic Issues    Routine visit    HPI:  71 yo male seen today for f/u. He c/o pain uncontrolled. States he was given 3 pain pills by nurse and felt groggy afterwards. He has hx falls. No f/c. No dizziness. No CP/SOB. No nursing issues.  Left hip avascular necrosis - 1.5 x 2.4 x 0.8 cm on left hip 12/29/15; takes fosamax 70 mg weekly and will monitor  Hx CVA - he has right hemiplegia; stable on ASA 81 mg daily; neurontin 100 mg nightly  Hx Chronic hepatitis C - stable. 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 Mood stable on seroquel xr 150 mg nightly   Hypertension - BP stable on minipress 1 mg nightly   Chronic pain of right hip - uncontrolled on lidoderm patch to right hip daily; he takes gabapentin qHS   Dyslipidemia - stable on lipitor 10 mg qMWF with no strange dreams. LDL 85   Past Medical History:  Diagnosis Date  . Depression   . HCV (hepatitis C virus)   . Headache   . Hyperlipidemia   . Stroke [facility])     Past  Surgical History:  Procedure Laterality Date  . CHOLECYSTECTOMY    . FOOT SURGERY     with metal brace in but  not any more   worn  brace on rt foot for 6 yrs    Patient Care Team: Jarome Matin, MD as PCP - General (Internal Medicine)  Social History   Social History  . Marital status: Single    Spouse name: N/A  . Number of children: N/A  . Years of education: N/A   Occupational History  . Not on file.   Social History Main Topics  . Smoking status: Former Smoker    Types: Cigarettes  . Smokeless tobacco: Never Used  . Alcohol use 1.2 - 1.8 oz/week    2 - 3 Cans of beer per week     Comment: * packs a week  . Drug use: No  . Sexual activity: Yes   Other Topics Concern  . Not on file   Social History Narrative  . No narrative on file     reports that he has quit smoking. His smoking use included Cigarettes. He has never used smokeless tobacco. He reports that he drinks about 1.2 - 1.8 oz of alcohol per week . He reports that he does not use drugs.  History reviewed. No pertinent family history. No family status information on file.    Immunization History  Administered  Date(s) Administered  . PPD Test 12/28/2015    Allergies  Allergen Reactions  . Codeine Nausea And Vomiting and Other (See Comments)    Sick on my stomach  . Sulfa Antibiotics Nausea And Vomiting and Other (See Comments)    sick  . Lexapro [Escitalopram Oxalate] Other (See Comments)    Unknown Reaction    Medications: Patient's Medications  New Prescriptions   No medications on file  Previous Medications   ALENDRONATE (FOSAMAX) 70 MG TABLET    Take 70 mg by mouth once a week. Take with a full glass of water on an empty stomach.   ASPIRIN EC 81 MG TABLET    Take 81 mg by mouth daily.   ATORVASTATIN (LIPITOR) 10 MG TABLET    Take 10 mg by mouth daily.   DIAZEPAM (VALIUM) 5 MG TABLET    Take 5 mg by mouth 2 (two) times daily. Give 2.5 mg by mouth in the morning and 1 tablet (5 mg) by  mouth at bedtime   GABAPENTIN (NEURONTIN) 100 MG CAPSULE    Take 100 mg by mouth 2 (two) times daily.    LIDOCAINE 4 % PTCH    Apply topically. Apply to both hips in th morning   ONDANSETRON (ZOFRAN) 4 MG TABLET    Take 4 mg by mouth every 8 (eight) hours as needed for nausea or vomiting.   OXYCODONE (OXY IR/ROXICODONE) 5 MG IMMEDIATE RELEASE TABLET    Take 5 mg by mouth every 6 (six) hours as needed for severe pain.   POLYETHYLENE GLYCOL (MIRALAX / GLYCOLAX) PACKET    Take 17 g by mouth daily.   PRAZOSIN (MINIPRESS) 1 MG CAPSULE    Take 1 mg by mouth at bedtime.   PROPYLENE GLYCOL (SYSTANE BALANCE OP)    Instill 1 drop in both eyes 2 times a day for dry eyes   QUETIAPINE (SEROQUEL) 25 MG TABLET    Take 12.5 mg by mouth at bedtime.   SACCHAROMYCES BOULARDII (FLORASTOR) 250 MG CAPSULE    Take 250 mg by mouth daily.   SODIUM PHOSPHATE (FLEET) 7-19 GM/118ML ENEM    Place 1 enema rectally daily as needed for severe constipation.  Modified Medications   No medications on file  Discontinued Medications   No medications on file    Review of Systems  Unable to perform ROS: Psychiatric disorder    Vitals:   08/28/16 1146  BP: 126/70  Pulse: 67  Resp: 19  Temp: 98.1 F (36.7 C)  SpO2: 99%  Weight: 155 lb (70.3 kg)  Height: 5\' 9"  (1.753 m)   Body mass index is 22.89 kg/m.  Physical Exam  Constitutional: He appears well-developed.  Sitting up in bed, frail appearing in NAD  HENT:  Mouth/Throat: Oropharynx is clear and moist.  MMM  Eyes: Pupils are equal, round, and reactive to light. No scleral icterus.  Neck: Neck supple. Carotid bruit is not present. No thyromegaly present.  Cardiovascular: Normal rate and intact distal pulses.  An irregularly irregular rhythm present. Exam reveals no gallop and no friction rub.   Murmur heard.  Systolic murmur is present with a grade of 1/6  RLE swelling but no overt edema. No LLE edema. No calf TTP b/l  Pulmonary/Chest: Effort normal and breath  sounds normal. He has no wheezes. He has no rales. He exhibits no tenderness.  Abdominal: Soft. Bowel sounds are normal. He exhibits no distension, no abdominal bruit, no pulsatile midline mass and no mass. There  is no hepatomegaly. There is no tenderness. There is no rebound and no guarding.  Musculoskeletal: He exhibits edema, tenderness and deformity (right hip internally rotated).  RUE contracture - flexion at wrist  Lymphadenopathy:    He has no cervical adenopathy.  Neurological: He is alert.  Right hemiparesis with swelling noted  Skin: Skin is warm and dry. No rash noted.  Psychiatric: Thought content normal. His mood appears anxious. His speech is slurred. He is agitated. He exhibits a depressed mood.     Labs reviewed: Nursing Home on 06/27/2016  Component Date Value Ref Range Status  . Hemoglobin A1C 05/31/2016 4.9   Final  Nursing Home on 05/30/2016  Component Date Value Ref Range Status  . Hemoglobin 02/15/2016 12.7* 13.5 - 17.5 g/dL Final  . HCT 96/04/540912/08/2015 39* 41 - 53 % Final  . Neutrophils Absolute 02/15/2016 35  /L Final  . Platelets 02/15/2016 133* 150 - 399 K/L Final  . WBC 02/15/2016 7.6  10^3/mL Final  . Glucose 02/15/2016 95  mg/dL Final  . BUN 81/19/147812/08/2015 9  4 - 21 mg/dL Final  . Creatinine 29/56/213012/08/2015 0.6  0.6 - 1.3 mg/dL Final  . Potassium 86/57/846912/08/2015 4.4  3.4 - 5.3 mmol/L Final  . Sodium 02/15/2016 136* 137 - 147 mmol/L Final  . Alkaline Phosphatase 02/15/2016 97  25 - 125 U/L Final  . ALT 02/15/2016 18  10 - 40 U/L Final  . AST 02/15/2016 45* 14 - 40 U/L Final  . Bilirubin, Total 02/15/2016 1.7  mg/dL Final  . Vitamin G-29B-12 52/84/132412/13/2017 1037   Final    No results found.   Assessment/Plan   ICD-10-CM   1. Left hip pain M25.552   2. Avascular necrosis of hip, left (HCC) M87.052   3. 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 Chronic pain syndrome G89.4   5. History of CVA (cerebrovascular accident) Z86.73   6.  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 Psychosis, unspecified psychosis type F29   8. Hypertension, benign I10   9. Dyslipidemia E78.5    Refer to physiatry to optimize rehab   Cont current pain meds as ordered  PT/OT/sT as indicated  F/u with specialists as scheduled  Cont current meds as ordered  Will follow    Blanchie Zeleznik S. Ancil Linseyarter, D. O., F. A. C. O. I.  [facility] [address] [place], [place] [address] [phone] Cell (Monday-Friday 8 AM - 5 PM) [phone] After 5 PM and follow prompts

## 2016-09-05 ENCOUNTER — Encounter: Payer: Self-pay | Admitting: Adult Health

## 2016-09-05 ENCOUNTER — Emergency Department (HOSPITAL_COMMUNITY): Payer: Medicare Other

## 2016-09-05 ENCOUNTER — Encounter (HOSPITAL_COMMUNITY): Payer: Self-pay | Admitting: Emergency Medicine

## 2016-09-05 ENCOUNTER — Non-Acute Institutional Stay (SKILLED_NURSING_FACILITY): Payer: Medicare Other | Admitting: Adult Health

## 2016-09-05 ENCOUNTER — Inpatient Hospital Stay (HOSPITAL_COMMUNITY)
Admission: EM | Admit: 2016-09-05 | Discharge: 2016-10-10 | DRG: 871 | Disposition: E | Payer: Medicare Other | Attending: Internal Medicine | Admitting: Internal Medicine

## 2016-09-05 DIAGNOSIS — B182 Chronic viral hepatitis C: Secondary | ICD-10-CM | POA: Diagnosis present

## 2016-09-05 DIAGNOSIS — R1310 Dysphagia, unspecified: Secondary | ICD-10-CM | POA: Diagnosis present

## 2016-09-05 DIAGNOSIS — R319 Hematuria, unspecified: Secondary | ICD-10-CM | POA: Diagnosis present

## 2016-09-05 DIAGNOSIS — I69351 Hemiplegia and hemiparesis following cerebral infarction affecting right dominant side: Secondary | ICD-10-CM | POA: Diagnosis not present

## 2016-09-05 DIAGNOSIS — R0902 Hypoxemia: Secondary | ICD-10-CM | POA: Diagnosis not present

## 2016-09-05 DIAGNOSIS — G894 Chronic pain syndrome: Secondary | ICD-10-CM | POA: Diagnosis not present

## 2016-09-05 DIAGNOSIS — A419 Sepsis, unspecified organism: Secondary | ICD-10-CM

## 2016-09-05 DIAGNOSIS — I69391 Dysphagia following cerebral infarction: Secondary | ICD-10-CM | POA: Diagnosis not present

## 2016-09-05 DIAGNOSIS — I10 Essential (primary) hypertension: Secondary | ICD-10-CM | POA: Diagnosis present

## 2016-09-05 DIAGNOSIS — Z7189 Other specified counseling: Secondary | ICD-10-CM

## 2016-09-05 DIAGNOSIS — I959 Hypotension, unspecified: Secondary | ICD-10-CM | POA: Diagnosis present

## 2016-09-05 DIAGNOSIS — Z87891 Personal history of nicotine dependence: Secondary | ICD-10-CM

## 2016-09-05 DIAGNOSIS — R05 Cough: Secondary | ICD-10-CM

## 2016-09-05 DIAGNOSIS — J690 Pneumonitis due to inhalation of food and vomit: Secondary | ICD-10-CM | POA: Diagnosis present

## 2016-09-05 DIAGNOSIS — R0603 Acute respiratory distress: Secondary | ICD-10-CM

## 2016-09-05 DIAGNOSIS — Z66 Do not resuscitate: Secondary | ICD-10-CM | POA: Diagnosis present

## 2016-09-05 DIAGNOSIS — Z885 Allergy status to narcotic agent status: Secondary | ICD-10-CM | POA: Diagnosis not present

## 2016-09-05 DIAGNOSIS — Z515 Encounter for palliative care: Secondary | ICD-10-CM | POA: Diagnosis not present

## 2016-09-05 DIAGNOSIS — J189 Pneumonia, unspecified organism: Secondary | ICD-10-CM

## 2016-09-05 DIAGNOSIS — Z8673 Personal history of transient ischemic attack (TIA), and cerebral infarction without residual deficits: Secondary | ICD-10-CM

## 2016-09-05 DIAGNOSIS — R0602 Shortness of breath: Secondary | ICD-10-CM | POA: Diagnosis not present

## 2016-09-05 DIAGNOSIS — Z79899 Other long term (current) drug therapy: Secondary | ICD-10-CM

## 2016-09-05 DIAGNOSIS — R059 Cough, unspecified: Secondary | ICD-10-CM

## 2016-09-05 DIAGNOSIS — Z9049 Acquired absence of other specified parts of digestive tract: Secondary | ICD-10-CM

## 2016-09-05 DIAGNOSIS — Z7982 Long term (current) use of aspirin: Secondary | ICD-10-CM | POA: Diagnosis not present

## 2016-09-05 DIAGNOSIS — Z888 Allergy status to other drugs, medicaments and biological substances status: Secondary | ICD-10-CM | POA: Diagnosis not present

## 2016-09-05 DIAGNOSIS — D649 Anemia, unspecified: Secondary | ICD-10-CM | POA: Diagnosis present

## 2016-09-05 DIAGNOSIS — Z79891 Long term (current) use of opiate analgesic: Secondary | ICD-10-CM

## 2016-09-05 DIAGNOSIS — F333 Major depressive disorder, recurrent, severe with psychotic symptoms: Secondary | ICD-10-CM | POA: Diagnosis present

## 2016-09-05 DIAGNOSIS — J9621 Acute and chronic respiratory failure with hypoxia: Secondary | ICD-10-CM | POA: Diagnosis present

## 2016-09-05 DIAGNOSIS — G8191 Hemiplegia, unspecified affecting right dominant side: Secondary | ICD-10-CM

## 2016-09-05 DIAGNOSIS — R4182 Altered mental status, unspecified: Secondary | ICD-10-CM | POA: Diagnosis present

## 2016-09-05 DIAGNOSIS — F4323 Adjustment disorder with mixed anxiety and depressed mood: Secondary | ICD-10-CM | POA: Diagnosis present

## 2016-09-05 DIAGNOSIS — E785 Hyperlipidemia, unspecified: Secondary | ICD-10-CM | POA: Diagnosis present

## 2016-09-05 DIAGNOSIS — J181 Lobar pneumonia, unspecified organism: Secondary | ICD-10-CM | POA: Diagnosis not present

## 2016-09-05 DIAGNOSIS — Z881 Allergy status to other antibiotic agents status: Secondary | ICD-10-CM

## 2016-09-05 LAB — COMPREHENSIVE METABOLIC PANEL
ALBUMIN: 2.8 g/dL — AB (ref 3.5–5.0)
ALT: 52 U/L (ref 17–63)
AST: 120 U/L — AB (ref 15–41)
Alkaline Phosphatase: 114 U/L (ref 38–126)
Anion gap: 8 (ref 5–15)
BUN: 9 mg/dL (ref 6–20)
CO2: 22 mmol/L (ref 22–32)
CREATININE: 0.8 mg/dL (ref 0.61–1.24)
Calcium: 8.3 mg/dL — ABNORMAL LOW (ref 8.9–10.3)
Chloride: 108 mmol/L (ref 101–111)
GFR calc Af Amer: >60 mL/min (ref >60)
GFR calc non Af Amer: >60 mL/min (ref >60)
Glucose, Bld: 87 mg/dL (ref 65–99)
POTASSIUM: 4.1 mmol/L (ref 3.5–5.1)
SODIUM: 138 mmol/L (ref 135–145)
Total Bilirubin: 1.6 mg/dL — ABNORMAL HIGH (ref 0.3–1.2)
Total Protein: 6.5 g/dL (ref 6.5–8.1)

## 2016-09-05 LAB — I-STAT CG4 LACTIC ACID, ED
LACTIC ACID, VENOUS: 4.66 mmol/L — AB (ref 0.5–1.9)
LACTIC ACID, VENOUS: 2.02 mmol/L — AB (ref 0.5–1.9)

## 2016-09-05 LAB — I-STAT TROPONIN, ED: Troponin i, poc: 0 ng/mL (ref 0.00–0.08)

## 2016-09-05 LAB — CBC WITH DIFFERENTIAL/PLATELET
Basophils Absolute: 0 10*3/uL (ref 0.0–0.1)
Basophils Relative: 0 %
EOS ABS: 0.2 10*3/uL (ref 0.0–0.7)
EOS PCT: 2 %
HCT: 33.3 % — ABNORMAL LOW (ref 39.0–52.0)
HEMOGLOBIN: 10.6 g/dL — AB (ref 13.0–17.0)
LYMPHS ABS: 1.9 10*3/uL (ref 0.7–4.0)
LYMPHS PCT: 19 %
MCH: 24.3 pg — AB (ref 26.0–34.0)
MCHC: 31.8 g/dL (ref 30.0–36.0)
MCV: 76.4 fL — AB (ref 78.0–100.0)
MONOS PCT: 4 %
Monocytes Absolute: 0.4 10*3/uL (ref 0.1–1.0)
Neutro Abs: 7.6 10*3/uL (ref 1.7–7.7)
Neutrophils Relative %: 75 %
PLATELETS: 215 10*3/uL (ref 150–400)
RBC: 4.36 MIL/uL (ref 4.22–5.81)
RDW: 17 % — ABNORMAL HIGH (ref 11.5–15.5)
WBC: 10.1 10*3/uL (ref 4.0–10.5)

## 2016-09-05 LAB — AMMONIA: Ammonia: 16 umol/L (ref 9–35)

## 2016-09-05 MED ORDER — SODIUM CHLORIDE 0.9 % IV BOLUS (SEPSIS)
1000.0000 mL | Freq: Once | INTRAVENOUS | Status: AC
  Administered 2016-09-05: 1000 mL via INTRAVENOUS

## 2016-09-05 MED ORDER — ENSURE ENLIVE PO LIQD
237.0000 mL | Freq: Two times a day (BID) | ORAL | Status: DC
  Administered 2016-09-07 – 2016-09-09 (×4): 237 mL via ORAL

## 2016-09-05 MED ORDER — QUETIAPINE 12.5 MG HALF TABLET
12.5000 mg | ORAL_TABLET | Freq: Every day | ORAL | Status: DC
  Administered 2016-09-06 – 2016-09-11 (×6): 12.5 mg via ORAL
  Filled 2016-09-05: qty 1
  Filled 2016-09-05: qty 0.5
  Filled 2016-09-05 (×5): qty 1

## 2016-09-05 MED ORDER — ATORVASTATIN CALCIUM 10 MG PO TABS
10.0000 mg | ORAL_TABLET | ORAL | Status: DC
  Administered 2016-09-06 – 2016-09-11 (×3): 10 mg via ORAL
  Filled 2016-09-05 (×3): qty 1

## 2016-09-05 MED ORDER — PIPERACILLIN-TAZOBACTAM 3.375 G IVPB 30 MIN
3.3750 g | Freq: Once | INTRAVENOUS | Status: AC
  Administered 2016-09-05: 3.375 g via INTRAVENOUS
  Filled 2016-09-05: qty 50

## 2016-09-05 MED ORDER — POLYETHYLENE GLYCOL 3350 17 G PO PACK
17.0000 g | PACK | Freq: Every day | ORAL | Status: DC
  Administered 2016-09-06 – 2016-09-09 (×4): 17 g via ORAL
  Filled 2016-09-05 (×4): qty 1

## 2016-09-05 MED ORDER — VANCOMYCIN HCL IN DEXTROSE 1-5 GM/200ML-% IV SOLN
1000.0000 mg | Freq: Once | INTRAVENOUS | Status: AC
  Administered 2016-09-05: 1000 mg via INTRAVENOUS
  Filled 2016-09-05: qty 200

## 2016-09-05 MED ORDER — IBUPROFEN 200 MG PO TABS
600.0000 mg | ORAL_TABLET | Freq: Once | ORAL | Status: AC
  Administered 2016-09-05: 600 mg via ORAL
  Filled 2016-09-05: qty 3

## 2016-09-05 MED ORDER — VANCOMYCIN HCL IN DEXTROSE 1-5 GM/200ML-% IV SOLN
1000.0000 mg | Freq: Two times a day (BID) | INTRAVENOUS | Status: DC
  Administered 2016-09-06 – 2016-09-08 (×5): 1000 mg via INTRAVENOUS
  Filled 2016-09-05 (×6): qty 200

## 2016-09-05 MED ORDER — PIPERACILLIN-TAZOBACTAM 3.375 G IVPB
3.3750 g | Freq: Three times a day (TID) | INTRAVENOUS | Status: DC
  Administered 2016-09-06 – 2016-09-08 (×8): 3.375 g via INTRAVENOUS
  Filled 2016-09-05 (×9): qty 50

## 2016-09-05 MED ORDER — ACETAMINOPHEN 500 MG PO TABS
1000.0000 mg | ORAL_TABLET | Freq: Once | ORAL | Status: AC
  Administered 2016-09-05: 1000 mg via ORAL
  Filled 2016-09-05: qty 2

## 2016-09-05 MED ORDER — SODIUM CHLORIDE 0.9 % IV BOLUS (SEPSIS)
250.0000 mL | Freq: Once | INTRAVENOUS | Status: AC
  Administered 2016-09-05: 250 mL via INTRAVENOUS

## 2016-09-05 MED ORDER — SACCHAROMYCES BOULARDII 250 MG PO CAPS
250.0000 mg | ORAL_CAPSULE | Freq: Every day | ORAL | Status: DC
  Administered 2016-09-06 – 2016-09-09 (×4): 250 mg via ORAL
  Filled 2016-09-05 (×5): qty 1

## 2016-09-05 MED ORDER — DIAZEPAM 5 MG PO TABS
5.0000 mg | ORAL_TABLET | Freq: Two times a day (BID) | ORAL | Status: DC
  Administered 2016-09-06: 5 mg via ORAL
  Filled 2016-09-05 (×2): qty 1

## 2016-09-05 MED ORDER — LIDOCAINE 5 % EX PTCH
1.0000 | MEDICATED_PATCH | Freq: Every day | CUTANEOUS | Status: DC
  Administered 2016-09-06 – 2016-09-11 (×6): 1 via TRANSDERMAL
  Filled 2016-09-05 (×8): qty 1

## 2016-09-05 MED ORDER — ENOXAPARIN SODIUM 40 MG/0.4ML ~~LOC~~ SOLN
40.0000 mg | Freq: Every day | SUBCUTANEOUS | Status: DC
  Administered 2016-09-06 – 2016-09-11 (×6): 40 mg via SUBCUTANEOUS
  Filled 2016-09-05 (×6): qty 0.4

## 2016-09-05 MED ORDER — ASPIRIN EC 81 MG PO TBEC
81.0000 mg | DELAYED_RELEASE_TABLET | Freq: Every day | ORAL | Status: DC
  Administered 2016-09-06 – 2016-09-09 (×4): 81 mg via ORAL
  Filled 2016-09-05 (×5): qty 1

## 2016-09-05 MED ORDER — OXYCODONE HCL 5 MG PO TABS
5.0000 mg | ORAL_TABLET | Freq: Four times a day (QID) | ORAL | Status: DC | PRN
Start: 2016-09-05 — End: 2016-09-08
  Administered 2016-09-06 – 2016-09-08 (×5): 5 mg via ORAL
  Filled 2016-09-05 (×6): qty 1

## 2016-09-05 MED ORDER — GABAPENTIN 100 MG PO CAPS
100.0000 mg | ORAL_CAPSULE | Freq: Two times a day (BID) | ORAL | Status: DC
  Administered 2016-09-06 – 2016-09-09 (×9): 100 mg via ORAL
  Filled 2016-09-05 (×9): qty 1

## 2016-09-05 MED ORDER — PRAZOSIN HCL 1 MG PO CAPS
1.0000 mg | ORAL_CAPSULE | Freq: Every day | ORAL | Status: DC
  Administered 2016-09-06 – 2016-09-11 (×5): 1 mg via ORAL
  Filled 2016-09-05 (×6): qty 1

## 2016-09-05 MED ORDER — POLYVINYL ALCOHOL 1.4 % OP SOLN
1.0000 [drp] | Freq: Two times a day (BID) | OPHTHALMIC | Status: DC
Start: 2016-09-06 — End: 2016-09-12
  Administered 2016-09-06 – 2016-09-11 (×11): 1 [drp] via OPHTHALMIC
  Filled 2016-09-05: qty 15

## 2016-09-05 MED ORDER — SODIUM CHLORIDE 0.9 % IV SOLN
INTRAVENOUS | Status: DC
  Administered 2016-09-05: 100 mL via INTRAVENOUS
  Administered 2016-09-06 – 2016-09-07 (×2): via INTRAVENOUS

## 2016-09-05 NOTE — H&P (Signed)
History and Physical    ROEN MACGOWAN ZOX:[id number] DOB: September 07, 1945 DOA: 09/08/16  PCP: Jarome Matin, MD   Patient coming from: Group home  Chief Complaint: Fever, rigors  HPI: Neil Lambert is a 71 y.o. gentleman with a history of CVA with right sided hemiparesis, chronic pain, chronic hepatitis C infection, HLD, and depression who lives in a group home.  He reports 3-4 days of nonproductive cough and subjective fever.  No sweats.  Today, he developed chills and rigors with light-headedness and one episode of nausea and vomiting.  He says that his roommate has had a cough; otherwise, he denies known sick contacts.  ED Course: Patient hypoxic on RA at 87%; improved to 92% with 6L [place].  Temp 103.3.  Lactic acid level 4.6.  WBC count 10.  Chest xray shows a right mid-lung infiltrate.  Patient treated per sepsis protocol.  He received vanc and zosyn.  Blood cultures sent.  Urine studies requested.  He received 2.25L NS (30cc/kg).  Hemodynamically stable at time of admission.  Review of Systems: As per HPI otherwise 10 systems reviewed and negative.   Past Medical History:  Diagnosis Date  . Depression   . HCV (hepatitis C virus)   . Headache   . Hyperlipidemia   . Stroke [facility])     Past Surgical History:  Procedure Laterality Date  . CHOLECYSTECTOMY    . FOOT SURGERY     with metal brace in but  not any more   worn  brace on rt foot for 6 yrs     reports that he has quit smoking. His smoking use included Cigarettes. He has never used smokeless tobacco. He reports that he drinks about 1.2 - 1.8 oz of alcohol per week . He reports that he does not use drugs.  Allergies  Allergen Reactions  . Codeine Nausea And Vomiting and Other (See Comments)    Sick on my stomach  . Sulfa Antibiotics Nausea And Vomiting and Other (See Comments)    sick  . Lexapro [Escitalopram Oxalate] Other (See Comments)    Unknown Reaction    FAMILY HISTORY: He denies any known family  history of heart disease, stroke, or diabetes.  His father died of complications related to a traumatic head injury.  His mother lived to be 68.  Prior to Admission medications   Medication Sig Start Date End Date Taking? Authorizing Provider  alendronate (FOSAMAX) 70 MG tablet Take 70 mg by mouth once a week. Take with a full glass of water on an empty stomach.   Yes [provider]  aspirin EC 81 MG tablet Take 81 mg by mouth daily.   Yes [provider]  atorvastatin (LIPITOR) 10 MG tablet Take 10 mg by mouth as directed. At bedtime on MWF.   Yes [provider]  diazepam (VALIUM) 5 MG tablet Take 5 mg by mouth 2 (two) times daily.    Yes [provider]  gabapentin (NEURONTIN) 100 MG capsule Take 100 mg by mouth 2 (two) times daily.    Yes [provider]  Lidocaine 4 % PTCH Apply topically. Apply to both hips in th morning   Yes [provider]  oxyCODONE (OXY IR/ROXICODONE) 5 MG immediate release tablet Take 5 mg by mouth every 6 (six) hours as needed for severe pain.   Yes [provider]  polyethylene glycol (MIRALAX / GLYCOLAX) packet Take 17 g by mouth daily.   Yes [provider]  prazosin (  MINIPRESS) 1 MG capsule Take 1 mg by mouth at bedtime.   Yes [provider]  Propylene Glycol (SYSTANE BALANCE OP) Instill 1 drop in both eyes 2 times a day for dry eyes   Yes [provider]  QUEtiapine (SEROQUEL) 25 MG tablet Take 12.5 mg by mouth at bedtime.   Yes [provider]  saccharomyces boulardii (FLORASTOR) 250 MG capsule Take 250 mg by mouth daily.   Yes [provider]  ondansetron (ZOFRAN) 4 MG tablet Take 4 mg by mouth every 8 (eight) hours as needed for nausea or vomiting.    [provider]  sodium phosphate (FLEET) 7-19 GM/118ML ENEM Place 1 enema rectally daily as needed for severe constipation.    [provider]    Physical Exam: Vitals:   08/14/2016  2128 08/11/2016 2130 09/04/2016 2219 09/03/2016 2255  BP: 116/64 (!) 112/56 (!) 110/53 (!) 110/53  Pulse: (!) 101 98 94 98  Resp: (!) 21 (!) 31 (!) 28 (!) 27  Temp:      TempSrc:      SpO2: 93% 95% 91% 95%  Weight:      Height:          Constitutional: NAD, calm, chronically ill appearing but he is not acutely decompensating Vitals:   08/18/2016 2128 08/17/2016 2130 08/10/2016 2219 08/12/2016 2255  BP: 116/64 (!) 112/56 (!) 110/53 (!) 110/53  Pulse: (!) 101 98 94 98  Resp: (!) 21 (!) 31 (!) 28 (!) 27  Temp:      TempSrc:      SpO2: 93% 95% 91% 95%  Weight:      Height:       Eyes: PERRL, lids and conjunctivae normal ENMT: Mucous membranes are moist. Posterior pharynx clear of any exudate or lesions. Normal dentition.  Neck: normal appearance, supple, no masses Respiratory: Diminished bilaterally.  No wheezing, no crackles. Normal respiratory effort. No accessory muscle use.  Cardiovascular: Normal rate, regular rhythm, no murmurs / rubs / gallops. No extremity edema. 2+ pedal pulses. GI: abdomen is soft and compressible.  No distention.  No tenderness.  No masses palpated.  Bowel sounds are present. Musculoskeletal:  No joint deformity in upper and lower extremities. Right sided hemiparesis.  Normal muscle tone.  Skin: no rashes, warm and dry Neurologic: Right sided hemiparesis.  CN 2-12 grossly intact. Psychiatric: Normal judgment and insight. Alert and oriented x 3. Normal mood.     Labs on Admission: I have personally reviewed following labs and imaging studies  CBC:  Recent Labs Lab 08/18/2016 1805  WBC 10.1  NEUTROABS 7.6  HGB 10.6*  HCT 33.3*  MCV 76.4*  PLT 215   Basic Metabolic Panel:  Recent Labs Lab 09/04/2016 1805  NA 138  K 4.1  CL 108  CO2 22  GLUCOSE 87  BUN 9  CREATININE 0.80  CALCIUM 8.3*   GFR: Estimated Creatinine Clearance: 83 mL/min (by C-G formula based on SCr of 0.8 mg/dL). Liver Function Tests:  Recent Labs Lab 08/21/2016 1805  AST 120*    ALT 52  ALKPHOS 114  BILITOT 1.6*  PROT 6.5  ALBUMIN 2.8*    Recent Labs Lab 08/14/2016 1805  AMMONIA 16   Urine analysis: Pending  Sepsis Labs:  Lactic acid level 4.66, repeat 2.02  Radiological Exams on Admission: Dg Chest Portable 1 View  Result Date: 08/29/2016 CLINICAL DATA:  Altered mental status. Fever or shortness of breath. EXAM: PORTABLE CHEST 1 VIEW COMPARISON:  03/22/2016 ; 07/12/2008;  chest CT -07/14/2008 FINDINGS: Grossly unchanged cardiac silhouette and mediastinal contours given decreased lung volumes and patient rotation. Thickening of the right paratracheal stripe is favored to be secondary to prominent vasculature. Potential developing airspace opacity within the right mid lung. No pleural effusion. No evidence of edema. Skin fold overlies the right lung apex. No pneumothorax. No acute osseus abnormalities. IMPRESSION: Findings worrisome for developing right mid lung pneumonia on this supine portable examination. Further evaluation with a PA and lateral chest radiograph may be obtained as clinically indicated. Electronically Signed   By: Simonne ComeJohn  Watts M.D.   On: 05/29/16 17:58    EKG: Independently reviewed. NSR.  No acute ST segment changes.  Assessment/Plan Active Problems:   Hepatitis C, chronic (HCC)   History of CVA (cerebrovascular accident)   Right hemiplegia (HCC)   Chronic pain syndrome   Sepsis (HCC)   CAP (community acquired pneumonia)      Sepsis secondary to CAP --Vanc and zosyn ordered in the ED; can likely de-escalate quickly if patient remains stable --Blood cultures pending --Check urine legionella and streptococcal antigens --wean oxygen as tolerated. --Turn q2h, incentive spirometry  History of CVA --Aspirin, statin  Chronic pain --neurontin, oxycodone, lidocaine patches  Abnormal LFTs attributed to history of hepatitis C   DVT prophylaxis: Lovenox Code Status: DNR Family Communication: Patient alone in the ED at time of  admission. Disposition Plan: To be determined. Consults called: NONE Admission status: Inpatient, stepdown unit.  I expect this patient will need inpatient services for greater than two midnights.   TIME SPENT: 60 minutes   Jerene Bearsarter,Debbe Crumble Harrison MD [facility] Pager [phone]  If 7PM-7AM, please contact night-coverage [email or web address] Password [email or web address]  06/21/16, 11:14 PM

## 2016-09-05 NOTE — ED Notes (Signed)
Pt has not urinated into the bag yet

## 2016-09-05 NOTE — Progress Notes (Signed)
Pharmacy Antibiotic Note  Neil Lambert is a 71 y.o. male admitted on 09/03/2016 from [facility] with suspected sepsis.  Pharmacy has been consulted for Vancomycin and Zosyn dosing.  Today, 08/30/2016: Tm 103.3 WBC 10.1 SCr 0.8, CrCl ~ 83 ml/min, CrCl ~ 66 ml/min using rounded SCr 1 for age  Plan:  Zosyn 3.375g IV Q8H infused over 4hrs.  Vancomycin 1g IV q12h.   Measure Vanc trough at steady state.  Follow up renal fxn, culture results, and clinical course.     Temp (24hrs), Avg:100.6 F (38.1 C), Min:97.9 F (36.6 C), Max:103.3 F (39.6 C)  No results for input(s): WBC, CREATININE, LATICACIDVEN, VANCOTROUGH, VANCOPEAK, VANCORANDOM, GENTTROUGH, GENTPEAK, GENTRANDOM, TOBRATROUGH, TOBRAPEAK, TOBRARND, AMIKACINPEAK, AMIKACINTROU, AMIKACIN in the last 168 hours.  CrCl cannot be calculated (Patient's most recent lab result is older than the maximum 21 days allowed.).    Allergies  Allergen Reactions  . Codeine Nausea And Vomiting and Other (See Comments)    Sick on my stomach  . Sulfa Antibiotics Nausea And Vomiting and Other (See Comments)    sick  . Lexapro [Escitalopram Oxalate] Other (See Comments)    Unknown Reaction    Antimicrobials this admission: 6/27 Vanc >>  6/27 Zosyn >>   Dose adjustments this admission:   Microbiology results: 6/27 BCx:    Thank you for allowing pharmacy to be a part of this patient's care.  Lynann Beaverhristine Elgene Coral PharmD, BCPS Pager [phone] 08/27/2016 5:36 PM

## 2016-09-05 NOTE — ED Notes (Signed)
Bed: RESA Expected date:  Expected time:  Means of arrival:  Comments: EMS/AMS ?sepsis?

## 2016-09-05 NOTE — ED Triage Notes (Addendum)
Pt from [facility] via EMS.  Staff reports that 2 hours before arrival pt reported chills/shaking and that he felt bad.  Pt is at baseline mentation which is slightly confused.  States that he was fine this am w/no other symptoms.  Hx UTIs.  Shaking and feverish upon arrival.  Some moderate agitation seen as well.

## 2016-09-05 NOTE — Progress Notes (Signed)
Location:   [facility] Room Number: 119 B Place of Service:  SNF (31)   CODE STATUS: DNR  Allergies  Allergen Reactions  . Codeine Nausea And Vomiting and Other (See Comments)    Sick on my stomach  . Sulfa Antibiotics Nausea And Vomiting and Other (See Comments)    sick  . Lexapro [Escitalopram Oxalate] Other (See Comments)    Unknown Reaction    Chief Complaint  Patient presents with  . Acute Visit    Change in status    HPI:  He has rigors without fever at this time. He is respiratory distress with 02 sat 87% on room air. We are concerned about sepsis. He does want to go to the ED; however; he is not oriented to his surroundings and is unable at this time to make this decision. His MOST form does read for him to have full scope of treatment with DNR.     Past Medical History:  Diagnosis Date  . Depression   . HCV (hepatitis C virus)   . Headache   . Hyperlipidemia   . Stroke [facility](HCC)     Past Surgical History:  Procedure Laterality Date  . CHOLECYSTECTOMY    . FOOT SURGERY     with metal brace in but  not any more   worn  brace on rt foot for 6 yrs    Social History   Social History  . Marital status: Single    Spouse name: N/A  . Number of children: N/A  . Years of education: N/A   Occupational History  . Not on file.   Social History Main Topics  . Smoking status: Former Smoker    Types: Cigarettes  . Smokeless tobacco: Never Used  . Alcohol use 1.2 - 1.8 oz/week    2 - 3 Cans of beer per week     Comment: * packs a week  . Drug use: No  . Sexual activity: Yes   Other Topics Concern  . Not on file   Social History Narrative  . No narrative on file   History reviewed. No pertinent family history.    VITAL SIGNS BP (!) 180/86   Pulse 98   Temp 97.9 F (36.6 C)   Resp (!) 28   Ht 5\' 9"  (1.753 m)   Wt 155 lb (70.3 kg)   SpO2 (!) 87%   BMI 22.89 kg/m   Patient's Medications  New Prescriptions   No medications on file   Previous Medications   ALENDRONATE (FOSAMAX) 70 MG TABLET    Take 70 mg by mouth once a week. Take with a full glass of water on an empty stomach.   ASPIRIN EC 81 MG TABLET    Take 81 mg by mouth daily.   ATORVASTATIN (LIPITOR) 10 MG TABLET    Take 10 mg by mouth daily.   DIAZEPAM (VALIUM) 5 MG TABLET    Take 5 mg by mouth 2 (two) times daily.    GABAPENTIN (NEURONTIN) 100 MG CAPSULE    Take 100 mg by mouth 2 (two) times daily.    LIDOCAINE 4 % PTCH    Apply topically. Apply to both hips in th morning   ONDANSETRON (ZOFRAN) 4 MG TABLET    Take 4 mg by mouth every 8 (eight) hours as needed for nausea or vomiting.   OXYCODONE (OXY IR/ROXICODONE) 5 MG IMMEDIATE RELEASE TABLET    Take 5 mg by mouth every 6 (six) hours as  needed for severe pain.   POLYETHYLENE GLYCOL (MIRALAX / GLYCOLAX) PACKET    Take 17 g by mouth daily.   PRAZOSIN (MINIPRESS) 1 MG CAPSULE    Take 1 mg by mouth at bedtime.   PROPYLENE GLYCOL (SYSTANE BALANCE OP)    Instill 1 drop in both eyes 2 times a day for dry eyes   QUETIAPINE (SEROQUEL) 25 MG TABLET    Take 12.5 mg by mouth at bedtime.   SACCHAROMYCES BOULARDII (FLORASTOR) 250 MG CAPSULE    Take 250 mg by mouth daily.   SODIUM PHOSPHATE (FLEET) 7-19 GM/118ML ENEM    Place 1 enema rectally daily as needed for severe constipation.  Modified Medications   No medications on file  Discontinued Medications   No medications on file     SIGNIFICANT DIAGNOSTIC EXAMS  12-13-15: right ankle x-ray: Soft tissue swelling laterally. No fracture. Ankle mortise appears intact. 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  12-13-15: right foot x-ray: Acute fracture proximal aspect fourth proximal phalanx with slight impaction at the fracture site. No other acute fracture evident. Old trauma with remodeling involving the cuboid bone. No dislocation. No appreciable joint space narrowing.   12-13-15: pelvic x-ray: No fracture or dislocation. Mild symmetric narrowing of both hip  joints. Note that on the pelvic image, there is a lucency in the lateral left femoral head, potentially a focus of avascular necrosis. Note that MR is the imaging study of choice to assess for avascular necrosis.  12-14-15: MRI of brain: 1.  No acute intracranial abnormality. 2. 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 3. 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  12-15-15: MRI of cervical spine: Normal appearing cervical cord. No finding to explain the patient's symptoms.  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 Please see above for descriptions of individual levels. Normal appearing cervical cord. No finding to explain the patient's symptoms. 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 Please see above for descriptions of individual levels.   12-29-15: MRI of left hip 1. 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 There is internal enhancement in a trace amount of surrounding marrow edema. 2. Mild-to-moderate degenerative findings in both hips. 3. Small tear of the anterior superior acetabular labrum on the left. 4. There is some patchy muscular edema especially on the right side involving the quadriceps, gluteus minimus, and gluteus medius. This is nonspecific but could reflect a low-grade myositis or muscular strains. Some of this edema tracks deep to the right iliotibial band. 5. Marrow heterogeneity is present. Although this can be caused by marrow infiltrative processes, the most common causes include anemia, smoking, obesity, or advancing age.  03-11-16: right tibia/fibula and ankle x-ray: unremarkable tibia, fibula and ankle   03-11-16: right foot x-ray: unremarkable foot      LABS REVIEWED:   12-14-15: vit B 12: 716; folate 14.3; tsh 2.523; RPR; nr; chol 126; ldl 85; trig 49; hdl 31 12-15-15: wbc 5.6; hgb 12.3; hct 38.8; mcv 100.5; plt 124; glucose 102; bun 8; creat 0.69; k+ 4.0; na++ 135  01-18-16: wbc 5.7; hgb 13.2; hct 41.4; mcv 99.8; plt 102; glucose 84; bun 5.0; creat 0.62; k+ 4.2; na++ 138 total bili 1.7; ast 47; albumin 3.1  02-09-16: wbc 6.9;  hgb 13.7; hct 41.9; mcv 96.9; plt 147; glucose 102; bun 8.5; creat 0.59; k+ 4.3; na++ 139; total bili 1.7; ast 52; albumin 3.2 tsh 2.14  02-15-16: wbc 7.6; hgb 12.7; hct 38.7; mcv 96.0; plt 133; glucose 95; bun 8.7; creat 0.59; k+ 4.4; na++ 136; total bili 1.7; ast 45; albumin 3.0  02-22-16: vit B 12: 1037; folic: >20.0 05-31-16: hgb a1c 4.9   Review of Systems  Unable to perform ROS: Medical condition     Physical Exam  Constitutional: He is awake is in acute respiratory distress   Eyes: Conjunctivae are normal.  Neck: Neck supple. No JVD present. No thyromegaly present.  Cardiovascular: Normal rate, regular rhythm and intact distal pulses.   Respiratory: Effort normal and breath sounds normal. No respiratory distress. He has no wheezes.  GI: Soft. Bowel sounds are normal. He exhibits no distension. There is no tenderness.  Musculoskeletal: He exhibits no edema.  Has right hemiparesis   Lymphadenopathy:    He has no cervical adenopathy.  Neurological: He is alert to self only  Skin: Skin is warm and dry. He is not diaphoretic.  Psychiatric: is anxoius     ASSESSMENT/ PLAN:  1. Acute respiratory distress 2. Questionable sepsis  He has 02 at 3 L/[place] Will send him to the ED for further work up and treatment    Time spent with patient  40  minutes >50% time spent counseling; reviewing medical record; tests; labs; and developing future plan of care   MD is aware of resident's narcotic use and is in agreement with current plan of care. We will attempt to wean resident as apropriate     Synthia Innocent  NP [facility]  Contact [phone] Monday through Friday 8am- 5pm  After hours call [phone]

## 2016-09-05 NOTE — ED Notes (Signed)
Rochelle [facility] called to [facility].  [place] family available [place].

## 2016-09-05 NOTE — ED Provider Notes (Signed)
Emergency Department Provider Note   I have reviewed the triage vital signs and the nursing notes.   HISTORY  Chief Complaint Code Sepsis   HPI Neil Lambert is a 71 y.o. male with PMH of CVA, HLD, and HCV presents to the emergency department for evaluation of sudden onset shaking chills and fever. Patient states he's not felt well for the past 2 days. He's had some pain with urination and some gradually worsening shortness of breath. 2 hours prior to ED presentation he had sudden onset shaking and chills. He felt much worse but denies any pain in his chest or abdomen. He notes chronic left hip pain is not significantly worse. He has an associated headache that has been gradually worsening over the past several days. No neck discomfort. No recent infections. The patient is not taking antibiotics. He had one episode of vomiting and denies diarrhea. No blood in the vomit. No radiation of symptoms.    Past Medical History:  Diagnosis Date  . Depression   . HCV (hepatitis C virus)   . Headache   . Hyperlipidemia   . Stroke [facility])     Patient Active Problem List   Diagnosis Date Noted  . Sepsis (HCC) 08/18/2016  . CAP (community acquired pneumonia) 08/17/2016  . Dyslipidemia 07/14/2016  . MDD (major depressive disorder), recurrent, severe, with psychosis (HCC) 05/03/2016  . Hypertension, benign 04/24/2016  . Chronic pain syndrome 03/27/2016  . Cervical spondylosis 03/27/2016  . Adjustment reaction with anxiety and depression 03/27/2016  . Avascular necrosis of hip, left (HCC) 01/29/2016  . Weakness of extremity   . Gait instability   . Depression 12/14/2015  . History of CVA (cerebrovascular accident) 12/14/2015  . Right hemiplegia (HCC)   . Right hip pain   . Generalized weakness 12/13/2015  . Hepatitis C, chronic (HCC) 12/06/2010    Past Surgical History:  Procedure Laterality Date  . CHOLECYSTECTOMY    . FOOT SURGERY     with metal brace in but  not any more    worn  brace on rt foot for 6 yrs    Current Outpatient Rx  . Order #: [id number] Class: Historical Med  . Order #: [id number] Class: Historical Med  . Order #: [id number] Class: Historical Med  . Order #: [id number] Class: Historical Med  . Order #: [id number] Class: Historical Med  . Order #: [id number] Class: Historical Med  . Order #: [id number] Class: Historical Med  . Order #: [id number] Class: Historical Med  . Order #: [id number] Class: Historical Med  . Order #: [id number] Class: Historical Med  . Order #: [id number] Class: Historical Med  . Order #: [id number] Class: Historical Med  . Order #: [id number] Class: Historical Med  . Order #: [id number] Class: Historical Med    Allergies Codeine; Sulfa antibiotics; and Lexapro [escitalopram oxalate]  No family history on file.  Social History Social History  Substance Use Topics  . Smoking status: Former Smoker    Types: Cigarettes  . Smokeless tobacco: Never Used  . Alcohol use 1.2 - 1.8 oz/week    2 - 3 Cans of beer per week     Comment: * packs a week    Review of Systems  Constitutional: Positive fever/chills Eyes: No visual changes. ENT: No sore throat. Cardiovascular: Denies chest pain. Respiratory: Positive shortness of breath. Gastrointestinal: No abdominal pain.  No nausea, no vomiting.  No diarrhea.  No constipation. Genitourinary: Positive for dysuria. Musculoskeletal: Negative for back pain. Skin: Negative for rash. Neurological: Negative for headaches, focal weakness or numbness.  10-point ROS otherwise negative.  ____________________________________________   PHYSICAL EXAM:  VITAL SIGNS: Vitals:   08/22/2016 2300 08/10/2016 2315  BP: (!) 143/71   Pulse: 100   Resp: (!) 30   Temp:  99 F (37.2 C)   Constitutional: Alert and oriented with active rigors. Appears unwell but able to provide full history.  Eyes: Conjunctivae are normal.  Head: Atraumatic. Nose: No congestion/rhinnorhea. Mouth/Throat: Mucous membranes  are dry.  Neck: No stridor.  No meningeal signs.  Cardiovascular: sinus tachycardia. Good peripheral circulation. Grossly normal heart sounds.   Respiratory: Normal respiratory effort.  No retractions. Lungs CTAB. Gastrointestinal: Soft and nontender. No distention.  Musculoskeletal: No lower extremity tenderness nor edema. No gross deformities of extremities. Neurologic:  Normal speech and language. Baseline RUE and RLE weakness. No other neurological deficits.  Skin:  Skin is warm, dry and intact. No rash noted.  ____________________________________________   LABS (all labs ordered are listed, but only abnormal results are displayed)  Labs Reviewed  COMPREHENSIVE METABOLIC PANEL - Abnormal; Notable for the following:       Result Value   Calcium 8.3 (*)    Albumin 2.8 (*)    AST 120 (*)    Total Bilirubin 1.6 (*)    All other components within normal limits  CBC WITH DIFFERENTIAL/PLATELET - Abnormal; Notable for the following:    Hemoglobin 10.6 (*)    HCT 33.3 (*)    MCV 76.4 (*)    MCH 24.3 (*)    RDW 17.0 (*)    All other components within normal limits  I-STAT CG4 LACTIC ACID, ED - Abnormal; Notable for the following:    Lactic Acid, Venous 4.66 (*)    All other components within normal limits  I-STAT CG4 LACTIC ACID, ED - Abnormal; Notable for the following:    Lactic Acid, Venous 2.02 (*)    All other components within normal limits  CULTURE, BLOOD (ROUTINE X 2)  CULTURE, BLOOD (ROUTINE X 2)  URINE CULTURE  AMMONIA  URINALYSIS, ROUTINE W REFLEX MICROSCOPIC  STREP PNEUMONIAE URINARY ANTIGEN  PROCALCITONIN  LEGIONELLA PNEUMOPHILA SEROGP 1 UR AG  COMPREHENSIVE METABOLIC PANEL  CBC  I-STAT TROPOININ, ED   ____________________________________________  EKG   EKG Interpretation  Date/Time:  Wednesday September 05 2016 21:16:53 EDT Ventricular Rate:  97 PR Interval:    QRS Duration: 92 QT Interval:  373 QTC Calculation: 474 R Axis:   -18 Text Interpretation:   Sinus rhythm Abnormal R-wave progression, early transition Inferior infarct, old No STEMI.  Confirmed by Alona Bene [phone]) on 09/03/2016 11:18:04 PM       ____________________________________________  RADIOLOGY  Dg Chest Portable 1 View  Result Date: 08/30/2016 CLINICAL DATA:  Altered mental status. Fever or shortness of breath. EXAM: PORTABLE CHEST 1 VIEW COMPARISON:  03/22/2016 ; 07/12/2008; chest CT -07/14/2008 FINDINGS: Grossly unchanged cardiac silhouette and mediastinal contours given decreased lung volumes and patient rotation. Thickening of the right paratracheal stripe is favored to be secondary to prominent vasculature. Potential developing airspace opacity within the right mid lung. No pleural effusion. No evidence of edema. Skin fold overlies the right lung apex. No pneumothorax. No acute osseus abnormalities. IMPRESSION: Findings worrisome for developing right mid lung pneumonia on this supine portable examination. Further evaluation with a PA and lateral chest radiograph may be obtained as clinically indicated. Electronically Signed   By: Simonne Come M.D.   On: 08/25/2016 17:58    ____________________________________________   PROCEDURES  Procedure(s) performed:   Procedures  CRITICAL CARE Performed by: Maia Plan Total critical care time: 45 minutes Critical care time was exclusive of separately billable procedures and treating other patients. Critical care was necessary to treat or prevent imminent or life-threatening deterioration. Critical care was time spent personally by me on the following activities: development of treatment plan with patient and/or surrogate as well as nursing, discussions with consultants, evaluation of patient's response to treatment, examination of patient, obtaining history from patient or surrogate, ordering and performing treatments and interventions, ordering and review of laboratory studies, ordering and review of radiographic studies,  pulse oximetry and re-evaluation of patient's condition.  Alona Bene, MD Emergency Medicine  ____________________________________________   INITIAL IMPRESSION / ASSESSMENT AND PLAN / ED COURSE  Pertinent labs & imaging results that were available during my care of the patient were reviewed by me and considered in my medical decision making (see chart for details).  Patient presents to the emergency department for evaluation of dysuria, shortness of breath, sudden onset fever and chills. On my initial exam the patient is actively having rigors. He has no abdominal tenderness. No focal findings on lung exam. He is febrile on arrival. Plan for Tylenol, sepsis order set, chest x-ray, and troponin. Will add ammonia with history of HCV.   06:14 PM Called regarding lactate of > 4. Updated fluids for 30 ml/kg. Abx started previously. Suspicion for PNA on portable CXR.   06:52 PM Reassessed patient who remains awake. He is DNR and I confirmed this with him at bedside. His MOST form reports that he would want intubation if needed but I discussed this this him and he emphatically stated that he WOULD NOT want intubation. HR down-trending. BP normal. Discussed PNA on CXR and treatment plan in detail.   Lactate down-trending from prior. Patient looking much improved on reassessment.   Discussed patient's case with Hospitalist, Dr. Montez Morita. Patient and family (if present) updated with plan. Care transferred to Hospitalist service.  I reviewed all nursing notes, vitals, pertinent old records, EKGs, labs, imaging (as available).  ___________________ _________________________  FINAL CLINICAL IMPRESSION(S) / ED DIAGNOSES  Final diagnoses:  Community acquired pneumonia, unspecified laterality  Sepsis, due to unspecified organism (HCC)  Hypoxemia     MEDICATIONS GIVEN DURING THIS VISIT:  Medications  vancomycin (VANCOCIN) IVPB 1000 mg/200 mL premix (not administered)  piperacillin-tazobactam  (ZOSYN) IVPB 3.375 g (not administered)  QUEtiapine (SEROQUEL) tablet 12.5 mg (not administered)  Lidocaine 4 % PTCH 1 patch (not administered)  aspirin EC tablet 81 mg (not administered)  atorvastatin (LIPITOR) tablet 10 mg (not administered)  diazepam (VALIUM) tablet 5 mg (not administered)  gabapentin (NEURONTIN) capsule 100 mg (not administered)  oxyCODONE (Oxy IR/ROXICODONE) immediate release tablet 5 mg (not administered)  polyethylene glycol (MIRALAX / GLYCOLAX) packet 17 g (not administered)  prazosin (MINIPRESS) capsule 1 mg (not administered)  Propylene Glycol 0.6 % SOLN 1 drop (not administered)  saccharomyces boulardii (FLORASTOR) capsule 250 mg (not administered)  enoxaparin (LOVENOX) injection 40 mg (not administered)  0.9 %  sodium chloride infusion (not administered)  piperacillin-tazobactam (ZOSYN) IVPB 3.375 g (0 g Intravenous Stopped 08/19/2016 1844)  vancomycin (VANCOCIN) IVPB 1000 mg/200 mL premix (0 mg Intravenous Stopped 08/12/2016 2103)  sodium chloride 0.9 % bolus 1,000 mL (1,000 mLs Intravenous New Bag/Given 09/01/2016 1759)  acetaminophen (TYLENOL) tablet 1,000 mg (1,000 mg Oral Given 08/24/2016 1808)  sodium chloride 0.9 % bolus 1,000 mL (0 mLs Intravenous Stopped 08/27/2016 1940)    And  sodium chloride 0.9 % bolus  250 mL (0 mLs Intravenous Stopped 08/28/2016 1940)  ibuprofen (ADVIL,MOTRIN) tablet 600 mg (600 mg Oral Given 08/21/2016 2228)     NEW OUTPATIENT MEDICATIONS STARTED DURING THIS VISIT:  None  Note:  This document was prepared using Dragon voice recognition software and may include unintentional dictation errors.  Alona BeneJoshua Anival Pasha, MD Emergency Medicine   Rie Mcneil, Arlyss RepressJoshua G, MD 09/03/2016 [phone]

## 2016-09-06 DIAGNOSIS — J181 Lobar pneumonia, unspecified organism: Secondary | ICD-10-CM

## 2016-09-06 DIAGNOSIS — R0602 Shortness of breath: Secondary | ICD-10-CM

## 2016-09-06 DIAGNOSIS — J189 Pneumonia, unspecified organism: Secondary | ICD-10-CM

## 2016-09-06 DIAGNOSIS — A419 Sepsis, unspecified organism: Principal | ICD-10-CM

## 2016-09-06 LAB — MRSA PCR SCREENING: MRSA by PCR: NEGATIVE

## 2016-09-06 LAB — CBC
HEMATOCRIT: 25.5 % — AB (ref 39.0–52.0)
Hemoglobin: 8.3 g/dL — ABNORMAL LOW (ref 13.0–17.0)
MCH: 24.5 pg — ABNORMAL LOW (ref 26.0–34.0)
MCHC: 32.5 g/dL (ref 30.0–36.0)
MCV: 75.2 fL — AB (ref 78.0–100.0)
Platelets: 133 10*3/uL — ABNORMAL LOW (ref 150–400)
RBC: 3.39 MIL/uL — AB (ref 4.22–5.81)
RDW: 17.2 % — AB (ref 11.5–15.5)
WBC: 18.3 10*3/uL — AB (ref 4.0–10.5)

## 2016-09-06 LAB — URINALYSIS, ROUTINE W REFLEX MICROSCOPIC
Bilirubin Urine: NEGATIVE
GLUCOSE, UA: NEGATIVE mg/dL
KETONES UR: 5 mg/dL — AB
Nitrite: NEGATIVE
PH: 5 (ref 5.0–8.0)
Protein, ur: NEGATIVE mg/dL
SPECIFIC GRAVITY, URINE: 1.026 (ref 1.005–1.030)
SQUAMOUS EPITHELIAL / LPF: NONE SEEN

## 2016-09-06 LAB — TYPE AND SCREEN
ABO/RH(D): O POS
Antibody Screen: NEGATIVE

## 2016-09-06 LAB — GLUCOSE, CAPILLARY: GLUCOSE-CAPILLARY: 94 mg/dL (ref 65–99)

## 2016-09-06 LAB — COMPREHENSIVE METABOLIC PANEL
ALT: 38 U/L (ref 17–63)
ANION GAP: 5 (ref 5–15)
AST: 86 U/L — ABNORMAL HIGH (ref 15–41)
Albumin: 1.9 g/dL — ABNORMAL LOW (ref 3.5–5.0)
Alkaline Phosphatase: 71 U/L (ref 38–126)
BILIRUBIN TOTAL: 1.6 mg/dL — AB (ref 0.3–1.2)
BUN: 13 mg/dL (ref 6–20)
CHLORIDE: 114 mmol/L — AB (ref 101–111)
CO2: 19 mmol/L — ABNORMAL LOW (ref 22–32)
Calcium: 7 mg/dL — ABNORMAL LOW (ref 8.9–10.3)
Creatinine, Ser: 0.91 mg/dL (ref 0.61–1.24)
GFR calc non Af Amer: >60 mL/min (ref >60)
Glucose, Bld: 104 mg/dL — ABNORMAL HIGH (ref 65–99)
POTASSIUM: 3.6 mmol/L (ref 3.5–5.1)
Sodium: 138 mmol/L (ref 135–145)
TOTAL PROTEIN: 4.7 g/dL — AB (ref 6.5–8.1)

## 2016-09-06 LAB — TSH: TSH: 2.264 u[IU]/mL (ref 0.350–4.500)

## 2016-09-06 LAB — IRON AND TIBC
Iron: 88 ug/dL (ref 45–182)
Saturation Ratios: 30 % (ref 17.9–39.5)
TIBC: 293 ug/dL (ref 250–450)
UIBC: 205 ug/dL

## 2016-09-06 LAB — VITAMIN B12: Vitamin B-12: 1063 pg/mL — ABNORMAL HIGH (ref 180–914)

## 2016-09-06 LAB — RETICULOCYTES
RBC.: 3.93 MIL/uL — AB (ref 4.22–5.81)
RETIC COUNT ABSOLUTE: 66.8 10*3/uL (ref 19.0–186.0)
Retic Ct Pct: 1.7 % (ref 0.4–3.1)

## 2016-09-06 LAB — FOLATE: FOLATE: 23.9 ng/mL (ref >5.9)

## 2016-09-06 LAB — STREP PNEUMONIAE URINARY ANTIGEN: STREP PNEUMO URINARY ANTIGEN: NEGATIVE

## 2016-09-06 LAB — PROCALCITONIN: PROCALCITONIN: 9.03 ng/mL

## 2016-09-06 LAB — ABO/RH: ABO/RH(D): O POS

## 2016-09-06 LAB — FERRITIN: FERRITIN: 35 ng/mL (ref 24–336)

## 2016-09-06 LAB — CORTISOL: Cortisol, Plasma: 3 ug/dL

## 2016-09-06 MED ORDER — IBUPROFEN 200 MG PO TABS
400.0000 mg | ORAL_TABLET | Freq: Four times a day (QID) | ORAL | Status: DC | PRN
  Administered 2016-09-06: 400 mg via ORAL
  Filled 2016-09-06: qty 2

## 2016-09-06 MED ORDER — DIAZEPAM 2 MG PO TABS
2.0000 mg | ORAL_TABLET | Freq: Two times a day (BID) | ORAL | Status: DC
  Administered 2016-09-06 – 2016-09-08 (×5): 2 mg via ORAL
  Filled 2016-09-06 (×5): qty 1

## 2016-09-06 MED ORDER — SODIUM CHLORIDE 0.9 % IV BOLUS (SEPSIS)
1000.0000 mL | Freq: Once | INTRAVENOUS | Status: AC
  Administered 2016-09-06: 1000 mL via INTRAVENOUS

## 2016-09-06 MED ORDER — SODIUM CHLORIDE 0.9 % IV BOLUS (SEPSIS): 500.0000 mL | INTRAVENOUS | Status: DC | PRN

## 2016-09-06 MED ORDER — HYDROCORTISONE NA SUCCINATE PF 100 MG IJ SOLR
100.0000 mg | Freq: Three times a day (TID) | INTRAMUSCULAR | Status: DC
  Administered 2016-09-06 – 2016-09-07 (×4): 100 mg via INTRAVENOUS
  Filled 2016-09-06 (×4): qty 2

## 2016-09-06 MED ORDER — SODIUM CHLORIDE 0.9 % IV BOLUS (SEPSIS)
500.0000 mL | Freq: Once | INTRAVENOUS | Status: AC
  Administered 2016-09-06: 500 mL via INTRAVENOUS

## 2016-09-06 MED ORDER — PANTOPRAZOLE SODIUM 40 MG PO TBEC
40.0000 mg | DELAYED_RELEASE_TABLET | Freq: Two times a day (BID) | ORAL | Status: DC
  Administered 2016-09-06 – 2016-09-11 (×9): 40 mg via ORAL
  Filled 2016-09-06 (×10): qty 1

## 2016-09-06 MED ORDER — ACETAMINOPHEN 325 MG PO TABS
650.0000 mg | ORAL_TABLET | Freq: Four times a day (QID) | ORAL | Status: DC | PRN
  Administered 2016-09-10: 650 mg via ORAL
  Filled 2016-09-06: qty 2

## 2016-09-06 NOTE — Progress Notes (Signed)
@IPLOG @        PROGRESS NOTE                                                                                                                                                                                                             Patient Demographics:    Neil Lambert, is a 71 y.o. male, DOB - Sep 12, 1945, AVW:[id number]  Admit date - 08/10/2016   Admitting Physician Michael Litter, MD  Outpatient Primary MD for the patient is Jarome Matin, MD  LOS - 1  Chief Complaint  Patient presents with  . Fever       Brief Narrative      Subjective:    Neil Lambert today has, No headache, No chest pain, No abdominal pain - No Nausea, No new weakness tingling or numbness, No Cough - Improved SOB.     Assessment  & Plan :     1. Sepsis. Presenting with fever or chills, chest x-ray D of right middle lobe infiltrate/pneumonia. Agree with aggressive IV fluids which will be continued, will add stress dose steroids for blood pressure support, check baseline cortisol and TSH, follow cultures, continue empiric IV antibiotics, soft diet with feeding assistance and aspiration precautions, have speech evaluate and rule out any aspiration. Continue oxygen and nebulizer treatments as needed.  2. History of CVA with right-sided hemiparesis. Continue aspirin and statin for secondary prevention, PT OT eval and treat. May require SNF.  3. Dyslipidemia. Continue statin.  4. Chronic pain. Minimize narcotics. Blood pressure is soft, supportive care with Tylenol/Motrin, continue Neurontin.  5. Chronic hep C infection. Outpatient follow-up with PCP ID and GI as needed.  7. Hematuria. Follow with PCP and outpatient neurology.  8. Anemia. Normocytic. Expect fall with dilution and aggressive hydration, check anemia panel type and screen. He is on PPI for now. No signs of ongoing GI blood loss.    Diet : Diet Heart Room service appropriate? Yes; Fluid consistency: Thin    Family Communication  :   None  Code Status :  Full  Disposition Plan  :  TBD  Consults  :  None  Procedures  :  None  DVT Prophylaxis  :  Lovenox   Lab Results  Component Value Date   PLT 133 (L) 09/06/2016    Inpatient Medications  Scheduled Meds: . aspirin EC  81 mg Oral Daily  . atorvastatin  10 mg Oral Once per day on Mon Wed Fri  . diazepam  5 mg Oral BID  . enoxaparin (LOVENOX)  injection  40 mg Subcutaneous QHS  . feeding supplement (ENSURE ENLIVE)  237 mL Oral BID BM  . gabapentin  100 mg Oral BID  . hydrocortisone sod succinate (SOLU-CORTEF) inj  100 mg Intravenous Q8H  . lidocaine  1 patch Transdermal Daily  . polyethylene glycol  17 g Oral Daily  . polyvinyl alcohol  1 drop Both Eyes BID  . prazosin  1 mg Oral QHS  . QUEtiapine  12.5 mg Oral QHS  . saccharomyces boulardii  250 mg Oral Daily   Continuous Infusions: . sodium chloride 100 mL (08/30/2016 2354)  . piperacillin-tazobactam (ZOSYN)  IV 3.375 g (09/06/16 0900)  . vancomycin 1,000 mg (09/06/16 0856)   PRN Meds:.acetaminophen, ibuprofen, oxyCODONE  Antibiotics  :    Anti-infectives    Start     Dose/Rate Route Frequency Ordered Stop   09/06/16 0800  vancomycin (VANCOCIN) IVPB 1000 mg/200 mL premix     1,000 mg 200 mL/hr over 60 Minutes Intravenous Every 12 hours 08/17/2016 1916     09/06/16 0200  piperacillin-tazobactam (ZOSYN) IVPB 3.375 g     3.375 g 12.5 mL/hr over 240 Minutes Intravenous Every 8 hours 08/28/2016 1916     09/04/2016 1730  piperacillin-tazobactam (ZOSYN) IVPB 3.375 g     3.375 g 100 mL/hr over 30 Minutes Intravenous  Once 08/30/2016 1727 09/03/2016 1844   09/03/2016 1730  vancomycin (VANCOCIN) IVPB 1000 mg/200 mL premix     1,000 mg 200 mL/hr over 60 Minutes Intravenous  Once 08/15/2016 1727 08/17/2016 2103         Objective:   Vitals:   09/06/16 0630 09/06/16 0645 09/06/16 0700 09/06/16 0715  BP: (!) 89/48 (!) 91/47 (!) 91/38   Pulse: 77 77 79 79  Resp: 18 19 20 20   Temp:      TempSrc:      SpO2: 94% 93%  91% 93%  Weight:      Height:        Wt Readings from Last 3 Encounters:  09/06/16 75.3 kg (166 lb 0.1 oz)  09/04/2016 70.3 kg (155 lb)  08/28/16 70.3 kg (155 lb)     Intake/Output Summary (Last 24 hours) at 09/06/16 0905 Last data filed at 09/06/16 0035  Gross per 24 hour  Intake             1500 ml  Output              150 ml  Net             1350 ml     Physical Exam  Awake Alert, Oriented X 3, No new F.N deficits, Chronic right-sided hemiparesis, Normal affect [place].AT,PERRAL Supple Neck,No JVD, No cervical lymphadenopathy appriciated.  Symmetrical Chest wall movement, Good air movement bilaterally, right-sided rales RRR,No Gallops,Rubs or new Murmurs, No Parasternal Heave +ve B.Sounds, Abd Soft, No tenderness, No organomegaly appriciated, No rebound - guarding or rigidity. No Cyanosis, Clubbing or edema, No new Rash or bruise      Data Review:    CBC  Recent Labs Lab 08/21/2016 1805 09/06/16 0332  WBC 10.1 18.3*  HGB 10.6* 8.3*  HCT 33.3* 25.5*  PLT 215 133*  MCV 76.4* 75.2*  MCH 24.3* 24.5*  MCHC 31.8 32.5  RDW 17.0* 17.2*  LYMPHSABS 1.9  --   MONOABS 0.4  --   EOSABS 0.2  --   BASOSABS 0.0  --     Chemistries   Recent Labs Lab 08/18/2016 1805 09/06/16 0332  NA 138 138  K 4.1 3.6  CL 108 114*  CO2 22 19*  GLUCOSE 87 104*  BUN 9 13  CREATININE 0.80 0.91  CALCIUM 8.3* 7.0*  AST 120* 86*  ALT 52 38  ALKPHOS 114 71  BILITOT 1.6* 1.6*   ------------------------------------------------------------------------------------------------------------------ No results for input(s): CHOL, HDL, LDLCALC, TRIG, CHOLHDL, LDLDIRECT in the last 72 hours.  Lab Results  Component Value Date   HGBA1C 4.9 05/31/2016   ------------------------------------------------------------------------------------------------------------------  Recent Labs  09/06/16 0800  TSH 2.264    ------------------------------------------------------------------------------------------------------------------  Recent Labs  09/06/16 0800  RETICCTPCT 1.7    Coagulation profile No results for input(s): INR, PROTIME in the last 168 hours.  No results for input(s): DDIMER in the last 72 hours.  Cardiac Enzymes No results for input(s): CKMB, TROPONINI, MYOGLOBIN in the last 168 hours.  Invalid input(s): CK ------------------------------------------------------------------------------------------------------------------ No results found for: BNP  Micro Results Recent Results (from the past 240 hour(s))  MRSA PCR Screening     Status: None   Collection Time: 08/26/2016 11:55 PM  Result Value Ref Range Status   MRSA by PCR NEGATIVE NEGATIVE Final    Comment:        The GeneXpert MRSA Assay (FDA approved for NASAL specimens only), is one component of a comprehensive MRSA colonization surveillance program. It is not intended to diagnose MRSA infection nor to guide or monitor treatment for MRSA infections.     Radiology Reports Dg Chest Portable 1 View  Result Date: 08/28/2016 CLINICAL DATA:  Altered mental status. Fever or shortness of breath. EXAM: PORTABLE CHEST 1 VIEW COMPARISON:  03/22/2016 ; 07/12/2008; chest CT -07/14/2008 FINDINGS: Grossly unchanged cardiac silhouette and mediastinal contours given decreased lung volumes and patient rotation. Thickening of the right paratracheal stripe is favored to be secondary to prominent vasculature. Potential developing airspace opacity within the right mid lung. No pleural effusion. No evidence of edema. Skin fold overlies the right lung apex. No pneumothorax. No acute osseus abnormalities. IMPRESSION: Findings worrisome for developing right mid lung pneumonia on this supine portable examination. Further evaluation with a PA and lateral chest radiograph may be obtained as clinically indicated. Electronically Signed   By: Simonne Come M.D.   On: 08/26/2016 17:58    Time Spent in minutes  30   Susa Raring M.D on 09/06/2016 at 9:05 AM  Between 7am to 7pm - Pager - [phone] ( page via [email or web address], text pages only, please mention full 10 digit call back number). After 7pm go to [email or web address] - password [facility]

## 2016-09-06 NOTE — Clinical Social Work Note (Signed)
Clinical Social Work Assessment  Patient Details  Name: Neil Lambert MRN: [id number] Date of Birth: Feb 07, 1946  Date of referral:  09/06/16               Reason for consult:  Facility Placement                Permission sought to share information with:    Permission granted to share information::  Yes, Verbal Permission Granted  Name::        Agency::     Relationship::     Contact Information:     Housing/Transportation Living arrangements for the past 2 months:  [place] of Information:  Patient Patient Interpreter Needed:  None Criminal Activity/Legal Involvement Pertinent to Current Situation/Hospitalization:    Significant Relationships:  Friend Baird Cancer [phone]) Lives with:  Facility Resident Do you feel safe going back to the place where you live?  Yes Need for family participation in patient care:  No (Coment)  Care giving concerns:  Pt presents as desperate to D/C home but feels [facility] won't allow hime to D/C due to their "making money off of me" and his being a "fall risk".  Pt stated he has had multiple falls.   Social Worker assessment / plan:  CSW met with pt and confirmed pt's plan to be discharged back to [facility] to live at discharge.  CSW provided active listening and validated pt's concerns that he will "never be able to go home again.  CSW offered encouragement in the pt's participation with rehab at [facility] gave [place] Dept permission to complete FL-2 and/or other needed documentation and send referrals out to [facility] or other SNF facilities via the hub per pt's request.  Pt has been living at [facility] prior to being admitted to [facility] for 7 years.  Employment status:  Retired Nurse, adult PT Recommendations:  Not assessed at this time Information / Referral to community resources:     Patient/Family's Response to care:  Patient alert and oriented.  Patient agreeable to plan, but is asking  CSW Dept for help on who can assist the pt in advocating for his ability to return home.  Pt.'s pleasant and appreciated CSW intervention.    Patient/Family's Understanding of and Emotional Response to Diagnosis, Current Treatment, and Prognosis:  Still assessing  Emotional Assessment Appearance:  Appears younger than stated age Attitude/Demeanor/Rapport:  Crying, Apprehensive Affect (typically observed):  Apprehensive, Pleasant, Calm, Sad Orientation:  Oriented to Self, Oriented to Place, Oriented to  Time, Oriented to Situation Alcohol / Substance use:    Psych involvement (Current and /or in the community):     Discharge Needs  Concerns to be addressed:  Adjustment to Illness Readmission within the last 30 days:  No Current discharge risk:  None Barriers to Discharge:  Other (Pt prefers to D/C home)   Claudine Mouton, [place] 09/06/2016, 8:33 PM

## 2016-09-06 NOTE — Evaluation (Signed)
Clinical/Bedside Swallow Evaluation Patient Details  Name: Neil Lambert MRN: [id number] Date of Birth: Mar 23, 1945  Today's Date: 09/06/2016 Time: SLP Start Time (ACUTE ONLY): 1249 SLP Stop Time (ACUTE ONLY): 1314 SLP Time Calculation (min) (ACUTE ONLY): 25 min  Past Medical History:  Past Medical History:  Diagnosis Date  . Depression   . HCV (hepatitis C virus)   . Headache   . Hyperlipidemia   . Stroke [facility](HCC)    Past Surgical History:  Past Surgical History:  Procedure Laterality Date  . CHOLECYSTECTOMY    . FOOT SURGERY     with metal brace in but  not any more   worn  brace on rt foot for 6 yrs   HPI:  71 [facility] with fever, AMS, Pt with ? right upper middle lobe infiltrates.  Pt also with PMH + for prior CVA with right HP, chronic pain, Hep C, hematuria, ETOH and prior smoker.  Swallow evaluation ordered.  Pt denies significant problems with swallowing, states he is near baseline.    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 Pt does have minimal palatal deviation to left upon phonation, ? minimal vagal nerve involvement.  No indication of airway compromise with po observed nor oral pocketing.  Pt did not fully pass 3 ounce water test- minimal right anterior loss and requiring rest x1 but no coughing/complaint of residuals.   Pt admits to occasional isues with large pills and reports taking them with puree has been helpful in the hospital.  Advised to continue if helpful and assure adequate oral clearance.  No SlP follow up indicated as all education completed.  Thanks.  SLP Visit Diagnosis: Dysphagia, oral phase (R13.11)    Aspiration Risk  Mild aspiration risk    Diet Recommendation Thin liquid;Dysphagia 3 (Mech soft)   Liquid Administration via: Cup;Straw Medication Administration: Whole meds with liquid (large pills with  puree) Supervision: Patient able to self feed;Comment (needs set up) Compensations: Small sips/bites;Slow rate;Lingual sweep for clearance of pocketing Postural Changes: Seated upright at 90 degrees;Remain upright for at least 30 minutes after po intake    Other  Recommendations Oral Care Recommendations: Oral care BID   Follow up Recommendations        Frequency and Duration            Prognosis        Swallow Study   General Date of Onset: 09/06/16 HPI: 71 yo [facility] fever, AMS, Pt with ? right upper middle lobe infiltrates.  Pt also with PMH + for prior CVA with right HP, chronic pain, Hep C, hematuria, ETOH and prior smoker.  Swallow evaluation ordered.  Pt denies significant problems with swallowing, states he is near baseline.  Type of Study: Bedside Swallow Evaluation Diet Prior to this Study: Dysphagia 3 (soft);Thin liquids Temperature Spikes Noted: No Respiratory Status: Nasal cannula History of Recent Intubation: No Behavior/Cognition: Alert;Cooperative;Pleasant mood Oral Cavity Assessment: Within Functional Limits Oral Care Completed by SLP: No Oral Cavity - Dentition: Adequate natural dentition Vision: Functional for self-feeding Self-Feeding Abilities: Needs set up (pt with right sided hemiparesis) Patient Positioning: Upright in bed Baseline Vocal Quality: Low vocal intensity Volitional Cough: Strong Volitional Swallow: Able to elicit    Oral/Motor/Sensory Function Overall Oral Motor/Sensory Function: Mild impairment Facial ROM: Reduced right Facial Symmetry: Abnormal symmetry right Facial Strength: Reduced right Facial Sensation:  Reduced right Lingual ROM: Within Functional Limits Lingual Symmetry: Within Functional Limits Lingual Strength: Within Functional Limits Velum: Impaired right (slight deviation) Mandible: Within Functional Limits   Ice Chips Ice chips: Not tested   Thin Liquid Thin Liquid: Impaired Presentation: Cup;Self  Fed;Straw Oral Phase Impairments: Reduced labial seal Oral Phase Functional Implications: Right anterior spillage Other Comments: minimal right anterior spillage x1 with sequential boluses of thin water - 3 ounce water test    Nectar Thick Nectar Thick Liquid: Not tested   Honey Thick Honey Thick Liquid: Not tested   Puree Puree: Not tested   Solid   GO   Solid: Within functional limits Presentation: Self Fed;Spoon        Donavan Burnet, MS [facility] SLP [phone]

## 2016-09-07 ENCOUNTER — Inpatient Hospital Stay (HOSPITAL_COMMUNITY): Payer: Medicare Other

## 2016-09-07 LAB — CBC
HEMATOCRIT: 27.8 % — AB (ref 39.0–52.0)
Hemoglobin: 9.1 g/dL — ABNORMAL LOW (ref 13.0–17.0)
MCH: 24.5 pg — ABNORMAL LOW (ref 26.0–34.0)
MCHC: 32.7 g/dL (ref 30.0–36.0)
MCV: 74.9 fL — AB (ref 78.0–100.0)
PLATELETS: 110 10*3/uL — AB (ref 150–400)
RBC: 3.71 MIL/uL — ABNORMAL LOW (ref 4.22–5.81)
RDW: 17.1 % — AB (ref 11.5–15.5)
WBC: 18.9 10*3/uL — AB (ref 4.0–10.5)

## 2016-09-07 LAB — BASIC METABOLIC PANEL
ANION GAP: 4 — AB (ref 5–15)
BUN: 11 mg/dL (ref 6–20)
CALCIUM: 7 mg/dL — AB (ref 8.9–10.3)
CO2: 16 mmol/L — AB (ref 22–32)
CREATININE: 0.75 mg/dL (ref 0.61–1.24)
Chloride: 118 mmol/L — ABNORMAL HIGH (ref 101–111)
Glucose, Bld: 144 mg/dL — ABNORMAL HIGH (ref 65–99)
Potassium: 4.3 mmol/L (ref 3.5–5.1)
Sodium: 138 mmol/L (ref 135–145)

## 2016-09-07 LAB — URINE CULTURE: CULTURE: NO GROWTH

## 2016-09-07 MED ORDER — COSYNTROPIN 0.25 MG IJ SOLR
0.2500 mg | Freq: Once | INTRAMUSCULAR | Status: AC
  Administered 2016-09-08: 0.25 mg via INTRAVENOUS
  Filled 2016-09-07: qty 0.25

## 2016-09-07 MED ORDER — HYDROCORTISONE NA SUCCINATE PF 100 MG IJ SOLR
50.0000 mg | Freq: Two times a day (BID) | INTRAMUSCULAR | Status: DC
  Administered 2016-09-07: 50 mg via INTRAVENOUS
  Filled 2016-09-07: qty 2

## 2016-09-07 NOTE — Progress Notes (Addendum)
 @IPLOG @        PROGRESS NOTE                                                                                                                                                                                                             Patient Demographics:    Neil Lambert, is a 71 y.o. male, DOB - 16-Jan-1946, ZOX:[id number]  Admit date - 08/17/2016   Admitting Physician Michael LitterNikki Carter, MD  Outpatient Primary MD for the patient is Jarome MatinPaterson, Daniel, MD  LOS - 2  Chief Complaint  Patient presents with  . Fever       Brief Narrative   Neil Lambert is a 71 y.o. gentleman with a history of CVA with right sided hemiparesis, chronic pain, chronic hepatitis C infection, HLD, and depression who lives in a group home.  He reports 3-4 days of nonproductive cough and subjective fever, was diagnosed with sepsis due to pneumonia.   Subjective:   Patient in bed, appears comfortable, denies any headache, no fever, no chest pain or pressure, no shortness of breath , no abdominal pain. No focal weakness. He is having some chronic back pain and body aches.   Assessment  & Plan :     1. Sepsis. Presenting with fever or chills, chest x-ray D of right middle lobe infiltrate/pneumonia. Was treated with aggressive IV fluids along with empiric IV antibiotics and stress dose steroids, interestingly his random cortisol was only 3 suggestive of relative adrenal insufficiency, his hemodynamics have improved and sepsis physiology seems to have resolved. Continue present IV antibiotics for another 24 hours still cultures finalize. Continue oxygen and nebulizer treatments, speech to evaluate. Transfer out of stepdown today. Start tapering stress dose steroids may do a cosyntropin stimulation test in a few days.  2. History of CVA with right-sided hemiparesis. Continue aspirin and Plavix for secondary prevention, PTOT eval and treatment require SNF. Currently lives in a group home.  3. Dyslipidemia. On  statin.  4. Chronic pain. Abortive care with home dose narcotics, and scents and Neurontin.  5. Chronic hep C infection. Outpatient follow-up with PCP ID and GI as needed.  7. Hematuria. Follow with PCP and outpatient neurology.  8. Anemia. Normocytic. Expect fall with dilution and aggressive hydration, check anemia panel type and screen. He is on PPI for now. No signs of ongoing GI blood loss.    Diet : DIET SOFT Room service appropriate? Yes; Fluid consistency: Thin    Family Communication  :  None  Code Status :  DNR  Disposition Plan  :  TBD  Consults  :  None  Procedures  :  None  DVT Prophylaxis  :  Lovenox   Lab Results  Component Value Date   PLT 110 (L) 09/07/2016    Inpatient Medications  Scheduled Meds: . aspirin EC  81 mg Oral Daily  . atorvastatin  10 mg Oral Once per day on Mon Wed Fri  . diazepam  2 mg Oral BID  . enoxaparin (LOVENOX) injection  40 mg Subcutaneous QHS  . feeding supplement (ENSURE ENLIVE)  237 mL Oral BID BM  . gabapentin  100 mg Oral BID  . hydrocortisone sod succinate (SOLU-CORTEF) inj  100 mg Intravenous Q8H  . lidocaine  1 patch Transdermal Daily  . pantoprazole  40 mg Oral BID  . polyethylene glycol  17 g Oral Daily  . polyvinyl alcohol  1 drop Both Eyes BID  . prazosin  1 mg Oral QHS  . QUEtiapine  12.5 mg Oral QHS  . saccharomyces boulardii  250 mg Oral Daily   Continuous Infusions: . piperacillin-tazobactam (ZOSYN)  IV Stopped (09/07/16 0629)  . sodium chloride    . vancomycin 1,000 mg (09/07/16 0813)   PRN Meds:.acetaminophen, ibuprofen, oxyCODONE, sodium chloride  Antibiotics  :    Anti-infectives    Start     Dose/Rate Route Frequency Ordered Stop   09/06/16 0800  vancomycin (VANCOCIN) IVPB 1000 mg/200 mL premix     1,000 mg 200 mL/hr over 60 Minutes Intravenous Every 12 hours 08/11/2016 1916     09/06/16 0200  piperacillin-tazobactam (ZOSYN) IVPB 3.375 g     3.375 g 12.5 mL/hr over 240 Minutes Intravenous Every  8 hours 08/31/2016 1916     09/04/2016 1730  piperacillin-tazobactam (ZOSYN) IVPB 3.375 g     3.375 g 100 mL/hr over 30 Minutes Intravenous  Once 08/16/2016 1727 09/02/2016 1844   08/23/2016 1730  vancomycin (VANCOCIN) IVPB 1000 mg/200 mL premix     1,000 mg 200 mL/hr over 60 Minutes Intravenous  Once 09/03/2016 1727 08/30/2016 2103         Objective:   Vitals:   09/07/16 0300 09/07/16 0326 09/07/16 0400 09/07/16 0500  BP: (!) 106/51  121/60 136/62  Pulse: 79  82 79  Resp: (!) 22  (!) 24 (!) 25  Temp:  98 F (36.7 C)    TempSrc:  Oral    SpO2: 91%  94% 94%  Weight:      Height:        Wt Readings from Last 3 Encounters:  09/06/16 75.3 kg (166 lb 0.1 oz)  08/17/2016 70.3 kg (155 lb)  08/28/16 70.3 kg (155 lb)     Intake/Output Summary (Last 24 hours) at 09/07/16 0846 Last data filed at 09/07/16 0546  Gross per 24 hour  Intake          2453.33 ml  Output             2430 ml  Net            23.33 ml     Physical Exam  Awake Alert, Oriented X 3, Chronic right-sided hemiparesis but No new F.N deficits, Normal affect [place].AT,PERRAL Supple Neck,No JVD, No cervical lymphadenopathy appriciated.  Symmetrical Chest wall movement, Good air movement bilaterally, few crackles RRR,No Gallops,Rubs or new Murmurs, No Parasternal Heave +ve B.Sounds, Abd Soft, No tenderness, No organomegaly appriciated, No rebound - guarding or rigidity. Foley in place No Cyanosis, Clubbing or edema, No  new Rash or bruise     Data Review:    CBC  Recent Labs Lab 08/31/2016 1805 09/06/16 0332 09/07/16 0321  WBC 10.1 18.3* 18.9*  HGB 10.6* 8.3* 9.1*  HCT 33.3* 25.5* 27.8*  PLT 215 133* 110*  MCV 76.4* 75.2* 74.9*  MCH 24.3* 24.5* 24.5*  MCHC 31.8 32.5 32.7  RDW 17.0* 17.2* 17.1*  LYMPHSABS 1.9  --   --   MONOABS 0.4  --   --   EOSABS 0.2  --   --   BASOSABS 0.0  --   --     Chemistries   Recent Labs Lab 09/01/2016 1805 09/06/16 0332 09/07/16 0321  NA 138 138 138  K 4.1 3.6 4.3  CL 108 114*  118*  CO2 22 19* 16*  GLUCOSE 87 104* 144*  BUN 9 13 11   CREATININE 0.80 0.91 0.75  CALCIUM 8.3* 7.0* 7.0*  AST 120* 86*  --   ALT 52 38  --   ALKPHOS 114 71  --   BILITOT 1.6* 1.6*  --    ------------------------------------------------------------------------------------------------------------------ No results for input(s): CHOL, HDL, LDLCALC, TRIG, CHOLHDL, LDLDIRECT in the last 72 hours.  Lab Results  Component Value Date   HGBA1C 4.9 05/31/2016   ------------------------------------------------------------------------------------------------------------------  Recent Labs  09/06/16 0800  TSH 2.264   ------------------------------------------------------------------------------------------------------------------  Recent Labs  09/06/16 0800  VITAMINB12 1,063*  FOLATE 23.9  FERRITIN 35  TIBC 293  IRON 88  RETICCTPCT 1.7    Coagulation profile No results for input(s): INR, PROTIME in the last 168 hours.  No results for input(s): DDIMER in the last 72 hours.  Cardiac Enzymes No results for input(s): CKMB, TROPONINI, MYOGLOBIN in the last 168 hours.  Invalid input(s): CK ------------------------------------------------------------------------------------------------------------------ No results found for: BNP  Micro Results Recent Results (from the past 240 hour(s))  MRSA PCR Screening     Status: None   Collection Time: 09/02/2016 11:55 PM  Result Value Ref Range Status   MRSA by PCR NEGATIVE NEGATIVE Final    Comment:        The GeneXpert MRSA Assay (FDA approved for NASAL specimens only), is one component of a comprehensive MRSA colonization surveillance program. It is not intended to diagnose MRSA infection nor to guide or monitor treatment for MRSA infections.     Radiology Reports Dg Chest Port 1 View  Result Date: 09/07/2016 CLINICAL DATA:  Shortness of breath. EXAM: PORTABLE CHEST 1 VIEW COMPARISON:  08/18/2016. FINDINGS: Heart size is  stable. Right mid and lower lung progressive infiltrate. Mild infiltrate left lung base noted on today's exam. No prominent pleural effusion. No pneumothorax. IMPRESSION: 1. Right mid and lower lung progressive infiltrate consistent with pneumonia. 2. Mild infiltrate noted in the left lung base on today's exam. Electronically Signed   By: Maisie Fus  Register   On: 09/07/2016 06:20   Dg Chest Portable 1 View  Result Date: 09/02/2016 CLINICAL DATA:  Altered mental status. Fever or shortness of breath. EXAM: PORTABLE CHEST 1 VIEW COMPARISON:  03/22/2016 ; 07/12/2008; chest CT -07/14/2008 FINDINGS: Grossly unchanged cardiac silhouette and mediastinal contours given decreased lung volumes and patient rotation. Thickening of the right paratracheal stripe is favored to be secondary to prominent vasculature. Potential developing airspace opacity within the right mid lung. No pleural effusion. No evidence of edema. Skin fold overlies the right lung apex. No pneumothorax. No acute osseus abnormalities. IMPRESSION: Findings worrisome for developing right mid lung pneumonia on this supine portable examination. Further evaluation with a PA  and lateral chest radiograph may be obtained as clinically indicated. Electronically Signed   By: Simonne Come M.D.   On: 09-13-16 17:58    Time Spent in minutes  30   Susa Raring M.D on 09/07/2016 at 8:46 AM  Between 7am to 7pm - Pager - [phone] ( page via [email or web address], text pages only, please mention full 10 digit call back number). After 7pm go to [email or web address] - password [email or web address]

## 2016-09-07 NOTE — Progress Notes (Signed)
PT Cancellation Note  Patient Details Name: Neil Lambert C Buda MRN: [id number] DOB: 03/05/1946   Cancelled Treatment:    Reason Eval/Treat Not Completed: Pain limiting ability to participate Pt reports pain limiting ability to participate at this time.  RN reports he received pain meds earlier this morning and is not due for more until 1230-1300.  RN also reports pt to transfer out of ICU today.   Will check back as schedule permits.   Grenda Lora,KATHrine E 09/07/2016, 9:31 AM Zenovia JarredKati Lajuan Kovaleski, PT, DPT 09/07/2016 Pager: [phone]

## 2016-09-07 NOTE — Progress Notes (Signed)
OT Cancellation Note  Patient Details Name: Neil Lambert MRN: [id number] DOB: 07-31-1945   Cancelled Treatment:    Reason Eval/Treat Not Completed: Pain limiting ability to participate. Will check back as schedule permits.  [address] Philmont StreetPENCER,Hien Perreira  Adama Ivins, OTR/L [phone] 09/07/2016 09/07/2016, 10:29 AM

## 2016-09-08 ENCOUNTER — Inpatient Hospital Stay (HOSPITAL_COMMUNITY): Payer: Medicare Other

## 2016-09-08 LAB — BASIC METABOLIC PANEL
BUN: 13 mg/dL (ref 6–20)
CALCIUM: 7.4 mg/dL — AB (ref 8.9–10.3)
CHLORIDE: 117 mmol/L — AB (ref 101–111)
CO2: 21 mmol/L — AB (ref 22–32)
Creatinine, Ser: 0.75 mg/dL (ref 0.61–1.24)
GFR calc non Af Amer: >60 mL/min (ref >60)
Glucose, Bld: 128 mg/dL — ABNORMAL HIGH (ref 65–99)
POTASSIUM: 4.1 mmol/L (ref 3.5–5.1)
SODIUM: 140 mmol/L (ref 135–145)

## 2016-09-08 LAB — CBC
HEMATOCRIT: 29.9 % — AB (ref 39.0–52.0)
HEMOGLOBIN: 9.6 g/dL — AB (ref 13.0–17.0)
MCH: 24.6 pg — ABNORMAL LOW (ref 26.0–34.0)
MCHC: 32.1 g/dL (ref 30.0–36.0)
MCV: 76.5 fL — ABNORMAL LOW (ref 78.0–100.0)
Platelets: 167 10*3/uL (ref 150–400)
RBC: 3.91 MIL/uL — AB (ref 4.22–5.81)
RDW: 17.7 % — ABNORMAL HIGH (ref 11.5–15.5)
WBC: 23.7 10*3/uL — AB (ref 4.0–10.5)

## 2016-09-08 LAB — ACTH STIMULATION, 3 TIME POINTS
CORTISOL 30 MIN: 28.9 ug/dL
CORTISOL 60 MIN: 30.1 ug/dL
Cortisol, Base: 28.6 ug/dL

## 2016-09-08 LAB — CORTISOL

## 2016-09-08 LAB — LEGIONELLA PNEUMOPHILA SEROGP 1 UR AG: L. pneumophila Serogp 1 Ur Ag: NEGATIVE

## 2016-09-08 MED ORDER — OXYCODONE HCL 5 MG PO TABS
5.0000 mg | ORAL_TABLET | Freq: Four times a day (QID) | ORAL | Status: DC | PRN
  Administered 2016-09-08 (×2): 5 mg via ORAL
  Filled 2016-09-08 (×2): qty 1

## 2016-09-08 MED ORDER — AMOXICILLIN-POT CLAVULANATE 875-125 MG PO TABS
1.0000 | ORAL_TABLET | Freq: Two times a day (BID) | ORAL | Status: DC
  Administered 2016-09-08 – 2016-09-09 (×3): 1 via ORAL
  Filled 2016-09-08 (×3): qty 1

## 2016-09-08 MED ORDER — HYDROCORTISONE NA SUCCINATE PF 100 MG IJ SOLR
50.0000 mg | Freq: Every day | INTRAMUSCULAR | Status: DC
  Administered 2016-09-08: 11:00:00 via INTRAVENOUS
  Filled 2016-09-08: qty 2

## 2016-09-08 MED ORDER — FUROSEMIDE 10 MG/ML IJ SOLN
20.0000 mg | Freq: Once | INTRAMUSCULAR | Status: AC
  Administered 2016-09-08: 20 mg via INTRAVENOUS
  Filled 2016-09-08: qty 2

## 2016-09-08 MED ORDER — ALBUTEROL SULFATE (2.5 MG/3ML) 0.083% IN NEBU
2.5000 mg | INHALATION_SOLUTION | RESPIRATORY_TRACT | Status: DC | PRN
  Administered 2016-09-08: 2.5 mg via RESPIRATORY_TRACT

## 2016-09-08 MED ORDER — AZITHROMYCIN 250 MG PO TABS
500.0000 mg | ORAL_TABLET | Freq: Every day | ORAL | Status: DC
  Administered 2016-09-08 – 2016-09-09 (×2): 500 mg via ORAL
  Filled 2016-09-08 (×4): qty 2

## 2016-09-08 MED ORDER — IPRATROPIUM-ALBUTEROL 0.5-2.5 (3) MG/3ML IN SOLN
3.0000 mL | RESPIRATORY_TRACT | Status: DC | PRN
  Administered 2016-09-08 – 2016-09-09 (×2): 3 mL via RESPIRATORY_TRACT
  Filled 2016-09-08 (×2): qty 3

## 2016-09-08 NOTE — Progress Notes (Signed)
Patient is a lab draw. Morrie SheldonAshley, lab tech is unable to coordinate with staff to draw ACTH stimulate at this time. Will have to reschedule this lab draw with the am lab tech and am staff nurse.

## 2016-09-08 NOTE — Progress Notes (Signed)
OT Cancellation Note  Patient Details Name: Neil Lambert MRN: [id number] DOB: Jan 11, 1946   Cancelled Treatment:    Reason Eval/Treat Not Completed: Pain limiting ability to participate. Patient adamantly stated, "I'm not getting up. I hurt all over." Patient reports he is a long term resident of [facility]. Will attempt OT one more time. If patient refuses, will sign off.  Calleigh Lafontant A Matika Bartell 09/08/2016, 9:11 AM

## 2016-09-08 NOTE — Progress Notes (Signed)
 @IPLOG @        PROGRESS NOTE                                                                                                                                                                                                             Patient Demographics:    Neil Lambert, is a 71 y.o. male, DOB - May 31, 1945, ZOX:[id number]  Admit date - 11/02/2016   Admitting Physician Michael LitterNikki Carter, MD  Outpatient Primary MD for the patient is Jarome MatinPaterson, Daniel, MD  LOS - 3  Chief Complaint  Patient presents with  . Fever       Brief Narrative   Neil Lambert is a 71 y.o. gentleman with a history of CVA with right sided hemiparesis, chronic pain, chronic hepatitis C infection, HLD, and depression who lives in a group home.  He reports 3-4 days of nonproductive cough and subjective fever, was diagnosed with sepsis due to pneumonia.   Subjective:   Patient in bed, appears comfortable, denies any headache, no fever, no chest pain or pressure, no shortness of breath , no abdominal pain. No focal weakness.Feels better today.    Assessment  & Plan :     1. Sepsis. Presenting with fever or chills, chest x-ray D of right middle lobe infiltrate/pneumonia. Likely due to aspiration, seen by speech currently on dysphagia 3 diet with feeding assistance and aspiration precautions, was treated aggressively with IV fluids, empiric IV antibiotics along with IV steroids for blood pressure support. Clinically improving, tapered down to oral antibiotics on 09/08/2016, still on IV steroids, continue oxygen and nebulizer treatments as needed. Cultures negative so far.  2. History of CVA with right-sided hemiparesis. Continue aspirin and Plavix for secondary prevention, PTOT eval and treatment require SNF. Currently lives in a group home.  3. Dyslipidemia. On statin.  4. Chronic pain. Abortive care with home dose narcotics, and scents and Neurontin.  5. Chronic hep C infection. Outpatient follow-up with PCP ID and  GI as needed.  7. Hematuria. Follow with PCP and outpatient neurology.  8. Anemia. Normocytic. Expect fall with dilution and aggressive hydration, check anemia panel type and screen. He is on PPI for now. No signs of ongoing GI blood loss.  9. Possible relative adrenal insufficiency, random cortisol when he was hypotensive was around 3, stim test pending. Continue IV hydrocortisone for now at a lower dose.    Diet : DIET SOFT Room service appropriate? Yes; Fluid consistency: Thin    Family Communication  :  None  Code Status :  DNR  Disposition Plan  :  TBD  Consults  :  None  Procedures  :  None  DVT Prophylaxis  :  Lovenox   Lab Results  Component Value Date   PLT 167 09/08/2016    Inpatient Medications  Scheduled Meds: . amoxicillin-clavulanate  1 tablet Oral Q12H  . aspirin EC  81 mg Oral Daily  . atorvastatin  10 mg Oral Once per day on Mon Wed Fri  . azithromycin  500 mg Oral Daily  . diazepam  2 mg Oral BID  . enoxaparin (LOVENOX) injection  40 mg Subcutaneous QHS  . feeding supplement (ENSURE ENLIVE)  237 mL Oral BID BM  . furosemide  20 mg Intravenous Once  . gabapentin  100 mg Oral BID  . hydrocortisone sod succinate (SOLU-CORTEF) inj  50 mg Intravenous Daily  . lidocaine  1 patch Transdermal Daily  . pantoprazole  40 mg Oral BID  . polyethylene glycol  17 g Oral Daily  . polyvinyl alcohol  1 drop Both Eyes BID  . prazosin  1 mg Oral QHS  . QUEtiapine  12.5 mg Oral QHS  . saccharomyces boulardii  250 mg Oral Daily   Continuous Infusions: . sodium chloride     PRN Meds:.acetaminophen, ibuprofen, oxyCODONE, sodium chloride  Antibiotics  :    Anti-infectives    Start     Dose/Rate Route Frequency Ordered Stop   09/08/16 1200  amoxicillin-clavulanate (AUGMENTIN) 875-125 MG per tablet 1 tablet     1 tablet Oral Every 12 hours 09/08/16 1150     09/08/16 1200  azithromycin (ZITHROMAX) tablet 500 mg     500 mg Oral Daily 09/08/16 1150     09/06/16 0800   vancomycin (VANCOCIN) IVPB 1000 mg/200 mL premix  Status:  Discontinued     1,000 mg 200 mL/hr over 60 Minutes Intravenous Every 12 hours 08/18/2016 1916 09/08/16 1150   09/06/16 0200  piperacillin-tazobactam (ZOSYN) IVPB 3.375 g  Status:  Discontinued     3.375 g 12.5 mL/hr over 240 Minutes Intravenous Every 8 hours 09/03/2016 1916 09/08/16 1150   08/16/2016 1730  piperacillin-tazobactam (ZOSYN) IVPB 3.375 g     3.375 g 100 mL/hr over 30 Minutes Intravenous  Once 08/26/2016 1727 09/04/2016 1844   08/17/2016 1730  vancomycin (VANCOCIN) IVPB 1000 mg/200 mL premix     1,000 mg 200 mL/hr over 60 Minutes Intravenous  Once 08/31/2016 1727 09/03/2016 2103         Objective:   Vitals:   09/07/16 1530 09/07/16 1554 09/07/16 2150 09/08/16 0610  BP:  132/69 132/76 124/71  Pulse:  88 86 80  Resp:  18 20 20   Temp: 97.6 F (36.4 C) 97.7 F (36.5 C) 98.4 F (36.9 C) 98.3 F (36.8 C)  TempSrc: Axillary Oral Oral Oral  SpO2:  97% 93% 96%  Weight:      Height:        Wt Readings from Last 3 Encounters:  09/06/16 75.3 kg (166 lb 0.1 oz)  09/08/2016 70.3 kg (155 lb)  08/28/16 70.3 kg (155 lb)     Intake/Output Summary (Last 24 hours) at 09/08/16 1152 Last data filed at 09/08/16 0600  Gross per 24 hour  Intake             1980 ml  Output             1575 ml  Net              405  ml     Physical Exam  Awake , No new F.N deficits, Chronic right-sided hemiparesis, Normal affect [place].AT,PERRAL Supple Neck,No JVD, No cervical lymphadenopathy appriciated.  Symmetrical Chest wall movement, Good air movement bilaterally, few crackles RRR,No Gallops,Rubs or new Murmurs, No Parasternal Heave +ve B.Sounds, Abd Soft, No tenderness, No organomegaly appriciated, No rebound - guarding or rigidity. No Cyanosis, Clubbing or edema, No new Rash or bruise    Data Review:    CBC  Recent Labs Lab Sep 27, 2016 1805 09/06/16 0332 09/07/16 0321 09/08/16 0951  WBC 10.1 18.3* 18.9* 23.7*  HGB 10.6* 8.3* 9.1* 9.6*   HCT 33.3* 25.5* 27.8* 29.9*  PLT 215 133* 110* 167  MCV 76.4* 75.2* 74.9* 76.5*  MCH 24.3* 24.5* 24.5* 24.6*  MCHC 31.8 32.5 32.7 32.1  RDW 17.0* 17.2* 17.1* 17.7*  LYMPHSABS 1.9  --   --   --   MONOABS 0.4  --   --   --   EOSABS 0.2  --   --   --   BASOSABS 0.0  --   --   --     Chemistries   Recent Labs Lab 2016-09-27 1805 09/06/16 0332 09/07/16 0321 09/08/16 0951  NA 138 138 138 140  K 4.1 3.6 4.3 4.1  CL 108 114* 118* 117*  CO2 22 19* 16* 21*  GLUCOSE 87 104* 144* 128*  BUN 9 13 11 13   CREATININE 0.80 0.91 0.75 0.75  CALCIUM 8.3* 7.0* 7.0* 7.4*  AST 120* 86*  --   --   ALT 52 38  --   --   ALKPHOS 114 71  --   --   BILITOT 1.6* 1.6*  --   --    ------------------------------------------------------------------------------------------------------------------ No results for input(s): CHOL, HDL, LDLCALC, TRIG, CHOLHDL, LDLDIRECT in the last 72 hours.  Lab Results  Component Value Date   HGBA1C 4.9 05/31/2016   ------------------------------------------------------------------------------------------------------------------  Recent Labs  09/06/16 0800  TSH 2.264   ------------------------------------------------------------------------------------------------------------------  Recent Labs  09/06/16 0800  VITAMINB12 1,063*  FOLATE 23.9  FERRITIN 35  TIBC 293  IRON 88  RETICCTPCT 1.7    Coagulation profile No results for input(s): INR, PROTIME in the last 168 hours.  No results for input(s): DDIMER in the last 72 hours.  Cardiac Enzymes No results for input(s): CKMB, TROPONINI, MYOGLOBIN in the last 168 hours.  Invalid input(s): CK ------------------------------------------------------------------------------------------------------------------ No results found for: BNP  Micro Results Recent Results (from the past 240 hour(s))  MRSA PCR Screening     Status: None   Collection Time: 2016-09-27 11:55 PM  Result Value Ref Range Status   MRSA by  PCR NEGATIVE NEGATIVE Final    Comment:        The GeneXpert MRSA Assay (FDA approved for NASAL specimens only), is one component of a comprehensive MRSA colonization surveillance program. It is not intended to diagnose MRSA infection nor to guide or monitor treatment for MRSA infections.   Culture, Urine     Status: None   Collection Time: 09/06/16 12:25 AM  Result Value Ref Range Status   Specimen Description URINE, CLEAN CATCH  Final   Special Requests NONE  Final   Culture   Final    NO GROWTH Performed at [facility], [address] [place], [place] [address]    Report Status 09/07/2016 FINAL  Final    Radiology Reports Dg Chest 2 View  Result Date: 09/08/2016 CLINICAL DATA:  SOB and cough today; pt right arm  was paralyzed, unable to raise up for better lateral view EXAM: CHEST  2 VIEW COMPARISON:  09/07/2016 FINDINGS: Mild improvement in the irregular interstitial and intervening hazy airspace opacity noted in the right upper lobe inferiorly, bordering the minor fissure, and both lung bases, greater on the right. No new lung abnormalities. There are small bilateral pleural effusions.  No pneumothorax. Cardiac silhouette is normal in size. No mediastinal or hilar masses or convincing adenopathy. IMPRESSION: 1. Mild improvement in lung aeration from the previous day's exam. Some of this apparent change may be technical only, findings to suggest mildly improved multifocal pneumonia. 2. Small bilateral pleural effusions. No convincing pulmonary edema. Electronically Signed   By: Amie Portland M.D.   On: 09/08/2016 09:00   Dg Chest Port 1 View  Result Date: 09/07/2016 CLINICAL DATA:  Shortness of breath. EXAM: PORTABLE CHEST 1 VIEW COMPARISON:  09/04/2016. FINDINGS: Heart size is stable. Right mid and lower lung progressive infiltrate. Mild infiltrate left lung base noted on today's exam. No prominent pleural effusion. No pneumothorax. IMPRESSION: 1. Right mid and lower lung  progressive infiltrate consistent with pneumonia. 2. Mild infiltrate noted in the left lung base on today's exam. Electronically Signed   By: Maisie Fus  Register   On: 09/07/2016 06:20   Dg Chest Portable 1 View  Result Date: 09/02/2016 CLINICAL DATA:  Altered mental status. Fever or shortness of breath. EXAM: PORTABLE CHEST 1 VIEW COMPARISON:  03/22/2016 ; 07/12/2008; chest CT -07/14/2008 FINDINGS: Grossly unchanged cardiac silhouette and mediastinal contours given decreased lung volumes and patient rotation. Thickening of the right paratracheal stripe is favored to be secondary to prominent vasculature. Potential developing airspace opacity within the right mid lung. No pleural effusion. No evidence of edema. Skin fold overlies the right lung apex. No pneumothorax. No acute osseus abnormalities. IMPRESSION: Findings worrisome for developing right mid lung pneumonia on this supine portable examination. Further evaluation with a PA and lateral chest radiograph may be obtained as clinically indicated. Electronically Signed   By: Simonne Come M.D.   On: 08/18/2016 17:58    Time Spent in minutes  30   Susa Raring M.D on 09/08/2016 at 11:52 AM  Between 7am to 7pm - Pager - [phone] ( page via [email or web address], text pages only, please mention full 10 digit call back number). After 7pm go to [email or web address] - password [facility]

## 2016-09-08 NOTE — Progress Notes (Signed)
PT Cancellation Note  Patient Details Name: Neil Lambert MRN: [id number] DOB: 1945/10/24   Cancelled Treatment:    Reason Eval/Treat Not Completed: Patient declined, patient returned from xray. Declined to participate in therapy even  with much encouragement. Patient is from [facility]. Plans to return there. Will check back another time.   Neil Lambert, Neil Lambert 09/08/2016, 9:15 AM Neil Lambert PT [phone]

## 2016-09-09 ENCOUNTER — Inpatient Hospital Stay (HOSPITAL_COMMUNITY): Payer: Medicare Other

## 2016-09-09 LAB — BASIC METABOLIC PANEL
Anion gap: 6 (ref 5–15)
BUN: 18 mg/dL (ref 6–20)
CO2: 23 mmol/L (ref 22–32)
Calcium: 7.4 mg/dL — ABNORMAL LOW (ref 8.9–10.3)
Chloride: 115 mmol/L — ABNORMAL HIGH (ref 101–111)
Creatinine, Ser: 0.93 mg/dL (ref 0.61–1.24)
Glucose, Bld: 124 mg/dL — ABNORMAL HIGH (ref 65–99)
Potassium: 5.2 mmol/L — ABNORMAL HIGH (ref 3.5–5.1)
SODIUM: 144 mmol/L (ref 135–145)

## 2016-09-09 LAB — BRAIN NATRIURETIC PEPTIDE: B NATRIURETIC PEPTIDE 5: 190.3 pg/mL — AB (ref 0.0–100.0)

## 2016-09-09 LAB — CBC
HCT: 29.3 % — ABNORMAL LOW (ref 39.0–52.0)
Hemoglobin: 9.3 g/dL — ABNORMAL LOW (ref 13.0–17.0)
MCH: 24 pg — AB (ref 26.0–34.0)
MCHC: 31.7 g/dL (ref 30.0–36.0)
MCV: 75.7 fL — ABNORMAL LOW (ref 78.0–100.0)
PLATELETS: 128 10*3/uL — AB (ref 150–400)
RBC: 3.87 MIL/uL — AB (ref 4.22–5.81)
RDW: 17.7 % — ABNORMAL HIGH (ref 11.5–15.5)
WBC: 20.3 10*3/uL — AB (ref 4.0–10.5)

## 2016-09-09 MED ORDER — MORPHINE SULFATE (PF) 2 MG/ML IV SOLN
1.0000 mg | INTRAVENOUS | Status: DC | PRN
  Administered 2016-09-09 – 2016-09-10 (×3): 1 mg via INTRAVENOUS
  Filled 2016-09-09 (×3): qty 1

## 2016-09-09 MED ORDER — HALOPERIDOL LACTATE 5 MG/ML IJ SOLN
2.0000 mg | Freq: Four times a day (QID) | INTRAMUSCULAR | Status: DC | PRN
  Administered 2016-09-09: 2 mg via INTRAVENOUS
  Filled 2016-09-09: qty 1

## 2016-09-09 MED ORDER — DIAZEPAM 2 MG PO TABS
2.0000 mg | ORAL_TABLET | Freq: Every day | ORAL | Status: DC
  Administered 2016-09-09 – 2016-09-11 (×2): 2 mg via ORAL
  Filled 2016-09-09 (×2): qty 1

## 2016-09-09 MED ORDER — HALOPERIDOL LACTATE 5 MG/ML IJ SOLN
5.0000 mg | Freq: Four times a day (QID) | INTRAMUSCULAR | Status: DC | PRN
  Filled 2016-09-09: qty 1

## 2016-09-09 MED ORDER — SODIUM POLYSTYRENE SULFONATE 15 GM/60ML PO SUSP
15.0000 g | Freq: Once | ORAL | Status: AC
Start: 2016-09-09 — End: 2016-09-09
  Administered 2016-09-09: 15 g via ORAL
  Filled 2016-09-09: qty 60

## 2016-09-09 MED ORDER — MORPHINE SULFATE (PF) 2 MG/ML IV SOLN
2.0000 mg | Freq: Once | INTRAVENOUS | Status: AC
  Administered 2016-09-09: 2 mg via INTRAVENOUS
  Filled 2016-09-09: qty 1

## 2016-09-09 MED ORDER — FUROSEMIDE 10 MG/ML IJ SOLN
20.0000 mg | Freq: Once | INTRAMUSCULAR | Status: AC
  Administered 2016-09-09: 20 mg via INTRAVENOUS
  Filled 2016-09-09: qty 2

## 2016-09-09 MED ORDER — FUROSEMIDE 10 MG/ML IJ SOLN
40.0000 mg | Freq: Once | INTRAMUSCULAR | Status: AC
  Administered 2016-09-09: 40 mg via INTRAVENOUS
  Filled 2016-09-09: qty 4

## 2016-09-09 NOTE — Progress Notes (Signed)
RT called to administer PRN neb tx. Patient is anxious and complaining of pain. Patient does not believe that breathing tx helped. Patient placed back on Venti mask per O2 sat of 84% on 6 L [place].

## 2016-09-09 NOTE — Progress Notes (Signed)
Mr  Neil Lambert is anxious and restless at this time. Bedside pulse ox shows saturation of 94% with O2 at 55% VM. Morphine 2mg  IV administered for comfort. Will continue to monitor and assess patient needs and concerns

## 2016-09-09 NOTE — Progress Notes (Signed)
Mr. Neil Lambert has C/O SOB with expiratory wheezing noted. Bedside pulse ox shows 82% with 02 in use at 2 l/m via Neil Lambert. 02 increased to 6 l/m via Neil Lambert with 02 sat up to 90%  Neil Lambert, hospitalist paged for breathing treatment . Notified  Respiratory therapy  and Rapid Response initiated.

## 2016-09-09 NOTE — Progress Notes (Signed)
SLP Cancellation Note  Patient Details Name: Neil Lambert MRN: [id number] DOB: 1945-04-12   Cancelled treatment:       Reason Eval/Treat Not Completed: Other (comment). MD ordered SLP to reevaluate given concern for aspiration pna. Given that pt has been seen by SLP at bedside with subjective findings to indicate aspiration, recommend proceeding with MBS for objective assessment of swallowing. Will f/u tomorrow for scheduling.    Ameliarose Shark, Riley NearingBonnie Caroline 09/09/2016, 3:06 PM

## 2016-09-09 NOTE — Progress Notes (Signed)
Verl BlalockHans Emanuell Morina Rn, Rapid and [employer]ica Neugent  respiratory therapist at bedside. Will continue to monitor and assess patient needs.

## 2016-09-09 NOTE — Progress Notes (Signed)
Patient very agitated and restless and hypoxic most of day, MD made aware and respiratory therapist, patient placed on 6l/[place] humidified oxygen because he is refusing venti mask and non rebreather mask, on 6l/[place] he is only saturating about 86 to 90 , patient complaining of pain mostly on his right hip and right leg,pain med was ordered and administered, finally patient was able to sleep, claims he feels better with just the o2 cannula.

## 2016-09-09 NOTE — Progress Notes (Signed)
OT Cancellation Note  Patient Details Name: Neil Lambert MRN: [id number] DOB: 1945-10-08   Cancelled Treatment:    Reason Eval/Treat Not Completed: Patient declined, reports he has pain, reports he does not wish to participate in OT at this time. Patient reports he is a long term resident of [facility] and requires assistance with all ADLs and a lift to get him to his wheelchair. Recommend OT at SNF evaluate patient once he returns to determine appropriateness of services. OT will sign off at this time.  Jadie Comas A Jermia Rigsby 09/09/2016, 11:31 AM

## 2016-09-09 NOTE — Progress Notes (Addendum)
@IPLOG @        PROGRESS NOTE                                                                                                                                                                                                             Patient Demographics:    Neil Lambert, is a 71 y.o. male, DOB - Sep 20, 1945, ZOX:[id number]  Admit date - 09/04/2016   Admitting Physician Michael Litter, MD  Outpatient Primary MD for the patient is Jarome Matin, MD  LOS - 4  Chief Complaint  Patient presents with  . Fever       Brief Narrative   IAAN OREGEL is a 71 y.o. gentleman with a history of CVA with right sided hemiparesis, chronic pain, chronic hepatitis C infection, HLD, and depression who lives in a group home.  He reports 3-4 days of nonproductive cough and subjective fever, was diagnosed with sepsis due to pneumonia.   Subjective:   Patient in bed, appears comfortable, denies any headache, no fever, no chest pain or pressure, MILD shortness of breath , no abdominal pain. No focal weakness.   Assessment  & Plan :     1. Sepsis. Due to recurrent aspiration pneumonia. This likely is combination of his dysphagia arising from previous stroke and use of high-dose narcotics, speech is following currently on dysphagia 3 diet with feeding assistance and aspiration precautions. Suspect he aspirated again night of 09/08/2016. Have minimize narcotics and benzodiazepines the most I can, speech to reevaluate, continue antibiotics and supportive care with oxygen and nebulizer treatments.   With recurrent aspiration long-term prognosis does not appear good will involve palliative care as well for goals of care. He is currently DO NOT RESUSCITATE.   Addendum - patient around 1pm more agitated and asking for pain Meds, gave Haldol and Morphine, refuse Venti mask, called the listed number for contact at 2.20pm   OTT,CHARLES  [phone]   Phone disconnected, at this point will focus on comfort  measures.    2. History of CVA with right-sided hemiparesis. Continue aspirin and Plavix for secondary prevention, he is refusing PT eval.  3. Dyslipidemia. Continue on statin.  4. Chronic pain. The demise narcotics and benzodiazepines due to #1 above, continue Neurontin.  5. Chronic hep C infection. Outpatient follow-up with PCP ID and GI as needed.  7. Hematuria. Follow with PCP and outpatient neurology.  8. Anemia. Normocytic. Expect fall with dilution and aggressive hydration, check anemia panel type and screen. He is on PPI for now. No  signs of ongoing GI blood loss.  9. Possible relative adrenal insufficiency, random cortisol when he was hypotensive was around 3, surprisingly cosyntropin stim lesion test was stable, hold steroids and monitor blood pressure closely.    Diet : DIET SOFT Room service appropriate? Yes; Fluid consistency: Thin    Family Communication  :  None  Code Status :  DNR  Disposition Plan  :  TBD  Consults  :  None  Procedures  :  None  DVT Prophylaxis  :  Lovenox   Lab Results  Component Value Date   PLT 128 (L) 09/09/2016    Inpatient Medications  Scheduled Meds: . amoxicillin-clavulanate  1 tablet Oral Q12H  . aspirin EC  81 mg Oral Daily  . atorvastatin  10 mg Oral Once per day on Mon Wed Fri  . azithromycin  500 mg Oral Daily  . diazepam  2 mg Oral QHS  . enoxaparin (LOVENOX) injection  40 mg Subcutaneous QHS  . feeding supplement (ENSURE ENLIVE)  237 mL Oral BID BM  . gabapentin  100 mg Oral BID  . lidocaine  1 patch Transdermal Daily  . pantoprazole  40 mg Oral BID  . polyethylene glycol  17 g Oral Daily  . polyvinyl alcohol  1 drop Both Eyes BID  . prazosin  1 mg Oral QHS  . QUEtiapine  12.5 mg Oral QHS  . saccharomyces boulardii  250 mg Oral Daily  . sodium polystyrene  15 g Oral Once   Continuous Infusions: . sodium chloride     PRN Meds:.acetaminophen, albuterol, haloperidol lactate, ibuprofen, ipratropium-albuterol,  sodium chloride  Antibiotics  :    Anti-infectives    Start     Dose/Rate Route Frequency Ordered Stop   09/08/16 2200  amoxicillin-clavulanate (AUGMENTIN) 875-125 MG per tablet 1 tablet     1 tablet Oral Every 12 hours 09/08/16 1150     09/08/16 1400  azithromycin (ZITHROMAX) tablet 500 mg     500 mg Oral Daily 09/08/16 1150     09/06/16 0800  vancomycin (VANCOCIN) IVPB 1000 mg/200 mL premix  Status:  Discontinued     1,000 mg 200 mL/hr over 60 Minutes Intravenous Every 12 hours 07/11/16 1916 09/08/16 1150   09/06/16 0200  piperacillin-tazobactam (ZOSYN) IVPB 3.375 g  Status:  Discontinued     3.375 g 12.5 mL/hr over 240 Minutes Intravenous Every 8 hours 07/11/16 1916 09/08/16 1150   07/11/16 1730  piperacillin-tazobactam (ZOSYN) IVPB 3.375 g     3.375 g 100 mL/hr over 30 Minutes Intravenous  Once 07/11/16 1727 07/11/16 1844   07/11/16 1730  vancomycin (VANCOCIN) IVPB 1000 mg/200 mL premix     1,000 mg 200 mL/hr over 60 Minutes Intravenous  Once 07/11/16 1727 07/11/16 2103         Objective:   Vitals:   09/08/16 2031 09/08/16 2331 09/09/16 0457 09/09/16 1047  BP: 131/71  (!) 146/76   Pulse: 91  (!) 101 94  Resp: 18  18 (!) 32  Temp: 98 F (36.7 C)  99.2 F (37.3 C)   TempSrc: Oral  Oral   SpO2: 97% 94% 92% (!) 84%  Weight:      Height:        Wt Readings from Last 3 Encounters:  09/06/16 75.3 kg (166 lb 0.1 oz)  07/11/16 70.3 kg (155 lb)  08/28/16 70.3 kg (155 lb)     Intake/Output Summary (Last 24 hours) at 09/09/16 1057 Last data filed at 09/09/16  0459  Gross per 24 hour  Intake              600 ml  Output             1250 ml  Net             -650 ml     Physical Exam  Awake, Chronic right-sided hemiparesis, [place].AT,PERRAL Supple Neck,No JVD, No cervical lymphadenopathy appriciated.  Symmetrical Chest wall movement, Good air movement bilaterally, bilateral rales and coarse breath sounds RRR,No Gallops,Rubs or new Murmurs, No Parasternal Heave +ve  B.Sounds, Abd Soft, No tenderness, No organomegaly appriciated, No rebound - guarding or rigidity. No Cyanosis, Clubbing or edema, No new Rash or bruise     Data Review:    CBC  Recent Labs Lab 08/21/2016 1805 09/06/16 0332 09/07/16 0321 09/08/16 0951 09/09/16 0406  WBC 10.1 18.3* 18.9* 23.7* 20.3*  HGB 10.6* 8.3* 9.1* 9.6* 9.3*  HCT 33.3* 25.5* 27.8* 29.9* 29.3*  PLT 215 133* 110* 167 128*  MCV 76.4* 75.2* 74.9* 76.5* 75.7*  MCH 24.3* 24.5* 24.5* 24.6* 24.0*  MCHC 31.8 32.5 32.7 32.1 31.7  RDW 17.0* 17.2* 17.1* 17.7* 17.7*  LYMPHSABS 1.9  --   --   --   --   MONOABS 0.4  --   --   --   --   EOSABS 0.2  --   --   --   --   BASOSABS 0.0  --   --   --   --     Chemistries   Recent Labs Lab 08/17/2016 1805 09/06/16 0332 09/07/16 0321 09/08/16 0951 09/09/16 0406  NA 138 138 138 140 144  K 4.1 3.6 4.3 4.1 5.2*  CL 108 114* 118* 117* 115*  CO2 22 19* 16* 21* 23  GLUCOSE 87 104* 144* 128* 124*  BUN 9 13 11 13 18   CREATININE 0.80 0.91 0.75 0.75 0.93  CALCIUM 8.3* 7.0* 7.0* 7.4* 7.4*  AST 120* 86*  --   --   --   ALT 52 38  --   --   --   ALKPHOS 114 71  --   --   --   BILITOT 1.6* 1.6*  --   --   --    ------------------------------------------------------------------------------------------------------------------ No results for input(s): CHOL, HDL, LDLCALC, TRIG, CHOLHDL, LDLDIRECT in the last 72 hours.  Lab Results  Component Value Date   HGBA1C 4.9 05/31/2016   ------------------------------------------------------------------------------------------------------------------ No results for input(s): TSH, T4TOTAL, T3FREE, THYROIDAB in the last 72 hours.  Invalid input(s): FREET3 ------------------------------------------------------------------------------------------------------------------ No results for input(s): VITAMINB12, FOLATE, FERRITIN, TIBC, IRON, RETICCTPCT in the last 72 hours.  Coagulation profile No results for input(s): INR, PROTIME in the last  168 hours.  No results for input(s): DDIMER in the last 72 hours.  Cardiac Enzymes No results for input(s): CKMB, TROPONINI, MYOGLOBIN in the last 168 hours.  Invalid input(s): CK ------------------------------------------------------------------------------------------------------------------    Component Value Date/Time   BNP 190.3 (H) 09/09/2016 0406    Micro Results Recent Results (from the past 240 hour(s))  Blood Culture (routine x 2)     Status: None (Preliminary result)   Collection Time: 08/25/2016 11:48 PM  Result Value Ref Range Status   Specimen Description BLOOD LEFT FOREARM  Final   Special Requests IN PEDIATRIC BOTTLE Blood Culture adequate volume  Final   Culture   Final    NO GROWTH 2 DAYS Performed at [facility], [address] [place],  [place] [address]    Report Status PENDING  Incomplete  Blood Culture (routine x 2)     Status: None (Preliminary result)   Collection Time: 2016-09-07 11:48 PM  Result Value Ref Range Status   Specimen Description BLOOD RIGHT FOREARM  Final   Special Requests   Final    BOTTLES DRAWN AEROBIC AND ANAEROBIC Blood Culture adequate volume   Culture   Final    NO GROWTH 2 DAYS Performed at [facility], [address], [place], [place] [address]    Report Status PENDING  Incomplete  MRSA PCR Screening     Status: None   Collection Time: Sep 07, 2016 11:55 PM  Result Value Ref Range Status   MRSA by PCR NEGATIVE NEGATIVE Final    Comment:        The GeneXpert MRSA Assay (FDA approved for NASAL specimens only), is one component of a comprehensive MRSA colonization surveillance program. It is not intended to diagnose MRSA infection nor to guide or monitor treatment for MRSA infections.   Culture, Urine     Status: None   Collection Time: 09/06/16 12:25 AM  Result Value Ref Range Status   Specimen Description URINE, CLEAN CATCH  Final   Special Requests NONE  Final   Culture   Final    NO GROWTH Performed at  [facility], [address] [place], [place] [address]    Report Status 09/07/2016 FINAL  Final    Radiology Reports Dg Chest 2 View  Result Date: 09/08/2016 CLINICAL DATA:  SOB and cough today; pt right arm was paralyzed, unable to raise up for better lateral view EXAM: CHEST  2 VIEW COMPARISON:  09/07/2016 FINDINGS: Mild improvement in the irregular interstitial and intervening hazy airspace opacity noted in the right upper lobe inferiorly, bordering the minor fissure, and both lung bases, greater on the right. No new lung abnormalities. There are small bilateral pleural effusions.  No pneumothorax. Cardiac silhouette is normal in size. No mediastinal or hilar masses or convincing adenopathy. IMPRESSION: 1. Mild improvement in lung aeration from the previous day's exam. Some of this apparent change may be technical only, findings to suggest mildly improved multifocal pneumonia. 2. Small bilateral pleural effusions. No convincing pulmonary edema. Electronically Signed   By: Amie Portland M.D.   On: 09/08/2016 09:00   Dg Chest Port 1 View  Result Date: 09/09/2016 CLINICAL DATA:  Shortness of breath. EXAM: PORTABLE CHEST 1 VIEW COMPARISON:  09/08/2016 FINDINGS: The cardiac silhouette, mediastinal and hilar contours are stable. Persistent bilateral infiltrates and underlying emphysema. No pleural effusions or pneumothorax. IMPRESSION: Persistent bilateral infiltrates. Electronically Signed   By: Rudie Meyer M.D.   On: 09/09/2016 10:02   Dg Chest Port 1 View  Result Date: 09/08/2016 CLINICAL DATA:  Respiratory distress EXAM: PORTABLE CHEST 1 VIEW COMPARISON:  09/08/2016 FINDINGS: Patchy airspace disease again noted throughout the right lung and left base. This is most pronounced in the right upper lobe with some improvement in both lung bases. Mild cardiomegaly. No effusions or acute bony abnormality. IMPRESSION: Patchy bilateral multifocal airspace opacities, most confluent in the right upper  lobe with some improvement in lung bases. Findings most compatible with multifocal pneumonia. Electronically Signed   By: Charlett Nose M.D.   On: 09/08/2016 23:33   Dg Chest Port 1 View  Result Date: 09/07/2016 CLINICAL DATA:  Shortness of breath. EXAM: PORTABLE CHEST 1 VIEW COMPARISON:  September 07, 2016. FINDINGS: Heart size is stable. Right mid and lower  lung progressive infiltrate. Mild infiltrate left lung base noted on today's exam. No prominent pleural effusion. No pneumothorax. IMPRESSION: 1. Right mid and lower lung progressive infiltrate consistent with pneumonia. 2. Mild infiltrate noted in the left lung base on today's exam. Electronically Signed   By: Maisie Fus  Register   On: 09/07/2016 06:20   Dg Chest Portable 1 View  Result Date: 17-Sep-2016 CLINICAL DATA:  Altered mental status. Fever or shortness of breath. EXAM: PORTABLE CHEST 1 VIEW COMPARISON:  03/22/2016 ; 07/12/2008; chest CT -07/14/2008 FINDINGS: Grossly unchanged cardiac silhouette and mediastinal contours given decreased lung volumes and patient rotation. Thickening of the right paratracheal stripe is favored to be secondary to prominent vasculature. Potential developing airspace opacity within the right mid lung. No pleural effusion. No evidence of edema. Skin fold overlies the right lung apex. No pneumothorax. No acute osseus abnormalities. IMPRESSION: Findings worrisome for developing right mid lung pneumonia on this supine portable examination. Further evaluation with a PA and lateral chest radiograph may be obtained as clinically indicated. Electronically Signed   By: Simonne Come M.D.   On: 09-17-2016 17:58    Time Spent in minutes  30   Susa Raring M.D on 09/09/2016 at 10:57 AM  Between 7am to 7pm - Pager - [phone] ( page via [email or web address], text pages only, please mention full 10 digit call back number). After 7pm go to [email or web address] - password [email or web address]

## 2016-09-09 NOTE — Progress Notes (Signed)
PT Cancellation Note  Patient Details Name: Ilona Sorrelrthur C Trussell MRN: [id number] DOB: 02-01-1946   Cancelled Treatment:    Reason Eval/Treat Not Completed: Patient declined, no reason specified;Other (comment);PT screened, no needs identified, will sign off;   Patient reports he is a long term resident of [facility] and requires assistance with all ADLs and a lift to get him to his wheelchair. Recommend PT  at SNF evaluate patient once he returns to determine appropriateness of services. PT will sign off at this time.   [facility] LLCWILLIAMS,Venice Liz 09/09/2016, 12:27 PM

## 2016-09-09 DEATH — deceased

## 2016-09-10 ENCOUNTER — Inpatient Hospital Stay (HOSPITAL_COMMUNITY): Payer: Medicare Other

## 2016-09-10 DIAGNOSIS — R0902 Hypoxemia: Secondary | ICD-10-CM

## 2016-09-10 DIAGNOSIS — Z7189 Other specified counseling: Secondary | ICD-10-CM

## 2016-09-10 DIAGNOSIS — Z515 Encounter for palliative care: Secondary | ICD-10-CM

## 2016-09-10 LAB — CBC
HEMATOCRIT: 27.6 % — AB (ref 39.0–52.0)
Hemoglobin: 8.9 g/dL — ABNORMAL LOW (ref 13.0–17.0)
MCH: 24.1 pg — AB (ref 26.0–34.0)
MCHC: 32.2 g/dL (ref 30.0–36.0)
MCV: 74.8 fL — AB (ref 78.0–100.0)
Platelets: 172 10*3/uL (ref 150–400)
RBC: 3.69 MIL/uL — ABNORMAL LOW (ref 4.22–5.81)
RDW: 18 % — AB (ref 11.5–15.5)
WBC: 14.4 10*3/uL — AB (ref 4.0–10.5)

## 2016-09-10 LAB — POTASSIUM: Potassium: 4 mmol/L (ref 3.5–5.1)

## 2016-09-10 MED ORDER — FENTANYL CITRATE (PF) 100 MCG/2ML IJ SOLN
25.0000 ug | INTRAMUSCULAR | Status: DC | PRN
  Administered 2016-09-10 – 2016-09-11 (×2): 25 ug via INTRAVENOUS
  Filled 2016-09-10 (×2): qty 2

## 2016-09-10 MED ORDER — GABAPENTIN 300 MG PO CAPS
300.0000 mg | ORAL_CAPSULE | Freq: Three times a day (TID) | ORAL | Status: DC
  Administered 2016-09-11: 300 mg via ORAL
  Filled 2016-09-10 (×2): qty 1

## 2016-09-10 MED ORDER — DEXTROSE 5 % IV SOLN
INTRAVENOUS | Status: DC
  Administered 2016-09-10: 50 mL via INTRAVENOUS

## 2016-09-10 MED ORDER — SODIUM CHLORIDE 0.9 % IV SOLN
3.0000 g | Freq: Four times a day (QID) | INTRAVENOUS | Status: DC
  Administered 2016-09-10 – 2016-09-11 (×4): 3 g via INTRAVENOUS
  Filled 2016-09-10 (×5): qty 3

## 2016-09-10 NOTE — Progress Notes (Signed)
SLP spoke to patient to confirm plan for MBS today.  Pt sleepy but participative.  He reports he just wants to "check out", admitting he is sad and it's "been 33 years since his stroke".  SLP offered pt emotional support and inquired if he informed MD of his feelings.  Pt denies desire to see chaplain nor informing MD of finding.  RN reports she contacted MD relaying pt's statements after she was present during this conversation.     Pt today admits to issues with swallowing = stating "I don't swallow good" but did not expand on information despite cues. He also admits to problems with constipation.  = RN aware.    Will proceed with MBS at approximately noon today.  Neil Burnetamara Finas Delone, MS [facility] SLP [phone]

## 2016-09-10 NOTE — Progress Notes (Signed)
Strict NPO, no meds per DR until Ba swallow.

## 2016-09-10 NOTE — Care Management Important Message (Signed)
Important Message  Patient Details  Name: Neil Lambert MRN: [id number] Date of Birth: Jul 28, 1945   Medicare Important Message Given:  Yes    Caren MacadamFuller, Dany Walther 09/10/2016, 9:04 AMImportant Message  Patient Details  Name: Neil Lambert MRN: [id number] Date of Birth: Jul 28, 1945   Medicare Important Message Given:  Yes    Caren MacadamFuller, Dequavious Harshberger 09/10/2016, 9:04 AM

## 2016-09-10 NOTE — Progress Notes (Signed)
Pharmacy Antibiotic Note  Neil Lambert is a 71 y.o. male admitted on January 05, 2017 [facility] with suspected sepsis due to recurrent aspiration pneumonia.  Pharmacy has been consulted for Unasyn.  MD also continuing azithromycin.  Plan: Unasyn 3g IV q6h. Recommend discontinuing azithromycin if no longer need atypical coverage.  Height: 5\' 9"  (175.3 cm) Weight: 166 lb 0.1 oz (75.3 kg) IBW/kg (Calculated) : 70.7  Temp (24hrs), Avg:100.2 F (37.9 C), Min:98.2 F (36.8 C), Max:102.1 F (38.9 C)   Recent Labs Lab 2016-11-30 1805 2016-11-30 1806 2016-11-30 2148 09/06/16 0332 09/07/16 0321 09/08/16 0951 09/09/16 0406 09/10/16 0635  WBC 10.1  --   --  18.3* 18.9* 23.7* 20.3* 14.4*  CREATININE 0.80  --   --  0.91 0.75 0.75 0.93  --   LATICACIDVEN  --  4.66* 2.02*  --   --   --   --   --     Estimated Creatinine Clearance: 71.8 mL/min (by C-G formula based on SCr of 0.93 mg/dL).    Allergies  Allergen Reactions  . Codeine Nausea And Vomiting and Other (See Comments)    Sick on my stomach  . Sulfa Antibiotics Nausea And Vomiting and Other (See Comments)    sick  . Lexapro [Escitalopram Oxalate] Other (See Comments)    Unknown Reaction    Antimicrobials this admission: 6/27 Vanc >> 6/30 6/27 Zosyn >> 6/30 6/30 Augmentin >> 7/2 6/30 Azithromycin >> 7/2 Unasyn >>  Dose adjustments this admission:  Microbiology results: 6/27 BCx:  ngtd 6/27 MRSA PCR: neg 6/28 UCx: NGF 6/28 ur strep: neg 6/27 ur legionella: neg 7/2 BCx:   Thank you for allowing pharmacy to be a part of this patient's care.  Clance BollAmanda Fate Caster, PharmD, BCPS Pager: [phone] 09/10/2016 10:15 AM

## 2016-09-10 NOTE — Progress Notes (Signed)
Modified Barium Swallow Progress Note  Patient Details  Name: Neil Lambert C Hitz MRN: [id number] Date of Birth: [birth year]  Today's Date: 09/10/2016  Modified Barium Swallow completed.  Full report located under Chart Review in the Imaging Section.  Brief recommendations include the following:  Clinical Impression  Pt presents with moderate oropharyngeal sensorimotor based dysphagia likely due to his late effects of CVA with exacerbation d/t deconditioning.   Delayed oral transiting and decreased lingual control noted with pt requiring 7 seconds to masticate small moistened cracker bolus.  Poor tongue base retraction and impaired epiglottic deflection allowed post=swallow residuals with no awareness.    Pt was overtly coughing on a few occasions during MBS - suspect barium mixed with oropharyngeal secretions and were aspirated. Laryngeal penetration and probable aspiration of thin observed due to decreased timing of laryngeal closure and poor epiglottic deflection.  Challenging view due to pt moving frequently during testing but coughing was worse with thin liquid and laryngeal penetration, likely aspiration occured.  Observed pt to conduct reflexively double swallow with extended breath hold - causing SLP to suspect some chronic deficits.  Residuals were worse with solids/puree than liquids.  Strategies such as chin tuck, head turn right with and without chin tuck did not help.  When pt able - dry swallow helped to decrease residuals - but these were difficult for pt to conduct on cue (likely due to motor planning deficits).    As pt is weak at this time, would recommend to modify diet to full liquids * nectar thick with strict precautions.  Pt denies significant dysphagia prior to admit, but he is sensory impaired and may not have awareness.     Using live video, educated pt to finding/recommendations.  Note palliative team following pt and plan is for no PEG.  Advised pt to strengthen his "hock"  and swallow to maximize airway protection and secretion clearance, pt attempted but is too weak to propel secretions at this time.  Will follow.     Swallow Evaluation Recommendations       SLP Diet Recommendations: Nectar thick liquid (full liquids)   Liquid Administration via: Straw   Medication Administration: Whole meds with puree (crush if large)   Supervision: Full assist for feeding;Full supervision/cueing for compensatory strategies   Compensations: Slow rate;Small sips/bites;Effortful swallow       Oral Care Recommendations: Oral care before and after PO   Other Recommendations: Order thickener from pharmacy;Clarify dietary restrictions;Have oral suction available (pt may have jello, icecream)    Chales AbrahamsKimball, Dreya Buhrman Ann 09/10/2016,2:04 PM

## 2016-09-10 NOTE — Progress Notes (Addendum)
@IPLOG @        PROGRESS NOTE                                                                                                                                                                                                             Patient Demographics:    Neil Lambert, is a 71 y.o. male, DOB - July 03, 1945, WGN:[id number]  Admit date - 08/10/2016   Admitting Physician Michael Litter, MD  Outpatient Primary MD for the patient is Jarome Matin, MD  LOS - 5  Chief Complaint  Patient presents with  . Fever       Brief Narrative   Neil Lambert is a 71 y.o. gentleman with a history of CVA with right sided hemiparesis, chronic pain, chronic hepatitis C infection, HLD, and depression who lives in a group home.  He reports 3-4 days of nonproductive cough and subjective fever, was diagnosed with sepsis due to pneumonia.   Subjective:   Patient in bed, appears comfortable, denies any headache, no fever, no chest pain or pressure, Says his cough is better and there is improvement in his shortness of breath , no abdominal pain. No focal weakness.    Assessment  & Plan :     1. Sepsis. Due to recurrent aspiration pneumoniaAnd acute on chronic hypoxic respiratory failure. This likely is combination of his dysphagia arising from previous stroke and use of high-dose narcotics, speech is following currently on dysphagia 3 diet with feeding assistance and aspiration precautions. Suspect he aspirated again night of 09/08/2016.   He is having recurrent aspiration, have counseled him strictly that he has to minimize narcotic and benzodiazepine intake to minimize his chances of aspiration, his prognosis now appears poor with this reoccurring cycle of aspiration, we'll continue Unasyn with azithromycin, speech to reevaluate, will involve palliative care as well for long-term goals of care, if declines further I think comfort measures will be appropriate. No family to contact, the only listed number  for Neil Lambert is disconnected. Continue oxygen currently requiring 5-6 L nasal cannula per minute along with nebulizer treatments as needed.  2. History of CVA with right-sided hemiparesis. Continue statin, aspirin and Plavix for secondary prevention, he is refusing PT eval.  3. Dyslipidemia. On statin continue.  4. Chronic pain. Increased Neurontin dose on 09/10/2016, minimize narcotics due to narcotics worsening his mentation and aspiration.  5. Chronic hep C infection. Outpatient follow-up with PCP ID and GI as needed.  7. Hematuria. Follow with PCP and outpatient neurology.  8. Anemia.  Normocytic. Expect fall with dilution and aggressive hydration, check anemia panel type and screen. He is on PPI for now. No signs of ongoing GI blood loss.  9. Possible relative adrenal insufficiency, random cortisol when he was hypotensive was around 3, surprisingly cosyntropin stim lesion test was stable, hold steroids and monitor blood pressure closely.    Diet : Diet NPO time specified Except for: Sips with Meds    Family Communication  :  None  Code Status :  DNR  Disposition Plan  :  TBD  Consults  :  None  Procedures  :  None  DVT Prophylaxis  :  Lovenox   Lab Results  Component Value Date   PLT 172 09/10/2016    Inpatient Medications  Scheduled Meds: . aspirin EC  81 mg Oral Daily  . atorvastatin  10 mg Oral Once per day on Mon Wed Fri  . azithromycin  500 mg Oral Daily  . diazepam  2 mg Oral QHS  . enoxaparin (LOVENOX) injection  40 mg Subcutaneous QHS  . feeding supplement (ENSURE ENLIVE)  237 mL Oral BID BM  . gabapentin  300 mg Oral TID  . lidocaine  1 patch Transdermal Daily  . pantoprazole  40 mg Oral BID  . polyethylene glycol  17 g Oral Daily  . polyvinyl alcohol  1 drop Both Eyes BID  . prazosin  1 mg Oral QHS  . QUEtiapine  12.5 mg Oral QHS  . saccharomyces boulardii  250 mg Oral Daily   Continuous Infusions: . sodium chloride     PRN  Meds:.acetaminophen, albuterol, haloperidol lactate, ibuprofen, ipratropium-albuterol, sodium chloride  Antibiotics  :    Anti-infectives    Start     Dose/Rate Route Frequency Ordered Stop   09/08/16 2200  amoxicillin-clavulanate (AUGMENTIN) 875-125 MG per tablet 1 tablet  Status:  Discontinued     1 tablet Oral Every 12 hours 09/08/16 1150 09/10/16 0938   09/08/16 1400  azithromycin (ZITHROMAX) tablet 500 mg     500 mg Oral Daily 09/08/16 1150     09/06/16 0800  vancomycin (VANCOCIN) IVPB 1000 mg/200 mL premix  Status:  Discontinued     1,000 mg 200 mL/hr over 60 Minutes Intravenous Every 12 hours 2016/07/27 1916 09/08/16 1150   09/06/16 0200  piperacillin-tazobactam (ZOSYN) IVPB 3.375 g  Status:  Discontinued     3.375 g 12.5 mL/hr over 240 Minutes Intravenous Every 8 hours 2016/07/27 1916 09/08/16 1150   2016/07/27 1730  piperacillin-tazobactam (ZOSYN) IVPB 3.375 g     3.375 g 100 mL/hr over 30 Minutes Intravenous  Once 2016/07/27 1727 2016/07/27 1844   2016/07/27 1730  vancomycin (VANCOCIN) IVPB 1000 mg/200 mL premix     1,000 mg 200 mL/hr over 60 Minutes Intravenous  Once 2016/07/27 1727 2016/07/27 2103         Objective:   Vitals:   09/09/16 1846 09/09/16 2247 09/10/16 0452 09/10/16 0749  BP:  (!) 160/78 139/61   Pulse:  (!) 104 99   Resp:  20 19   Temp:  (!) 101 F (38.3 C) (!) 102.1 F (38.9 C) 99.4 F (37.4 C)  TempSrc:  Oral Oral   SpO2: 92% 90% (!) 89%   Weight:      Height:        Wt Readings from Last 3 Encounters:  09/06/16 75.3 kg (166 lb 0.1 oz)  2016/07/27 70.3 kg (155 lb)  08/28/16 70.3 kg (155 lb)     Intake/Output Summary (  Last 24 hours) at 09/10/16 0939 Last data filed at 09/10/16 0438  Gross per 24 hour  Intake              101 ml  Output             3550 ml  Net            -3449 ml     Physical Exam  Awake, Appears fatigued, chronic right-sided hemiparesis [place].AT,PERRAL Supple Neck,No JVD, No cervical lymphadenopathy appriciated.  Symmetrical Chest  wall movement, Good air movement bilaterally, coarse bilateral breath sounds RRR,No Gallops,Rubs or new Murmurs, No Parasternal Heave +ve B.Sounds, Abd Soft, No tenderness, No organomegaly appriciated, No rebound - guarding or rigidity. No Cyanosis, Clubbing or edema, No new Rash or bruise      Data Review:    CBC  Recent Labs Lab 08/28/2016 1805 09/06/16 0332 09/07/16 0321 09/08/16 0951 09/09/16 0406 09/10/16 0635  WBC 10.1 18.3* 18.9* 23.7* 20.3* 14.4*  HGB 10.6* 8.3* 9.1* 9.6* 9.3* 8.9*  HCT 33.3* 25.5* 27.8* 29.9* 29.3* 27.6*  PLT 215 133* 110* 167 128* 172  MCV 76.4* 75.2* 74.9* 76.5* 75.7* 74.8*  MCH 24.3* 24.5* 24.5* 24.6* 24.0* 24.1*  MCHC 31.8 32.5 32.7 32.1 31.7 32.2  RDW 17.0* 17.2* 17.1* 17.7* 17.7* 18.0*  LYMPHSABS 1.9  --   --   --   --   --   MONOABS 0.4  --   --   --   --   --   EOSABS 0.2  --   --   --   --   --   BASOSABS 0.0  --   --   --   --   --     Chemistries   Recent Labs Lab 09/04/2016 1805 09/06/16 0332 09/07/16 0321 09/08/16 0951 09/09/16 0406 09/10/16 0635  NA 138 138 138 140 144  --   K 4.1 3.6 4.3 4.1 5.2* 4.0  CL 108 114* 118* 117* 115*  --   CO2 22 19* 16* 21* 23  --   GLUCOSE 87 104* 144* 128* 124*  --   BUN 9 13 11 13 18   --   CREATININE 0.80 0.91 0.75 0.75 0.93  --   CALCIUM 8.3* 7.0* 7.0* 7.4* 7.4*  --   AST 120* 86*  --   --   --   --   ALT 52 38  --   --   --   --   ALKPHOS 114 71  --   --   --   --   BILITOT 1.6* 1.6*  --   --   --   --    ------------------------------------------------------------------------------------------------------------------ No results for input(s): CHOL, HDL, LDLCALC, TRIG, CHOLHDL, LDLDIRECT in the last 72 hours.  Lab Results  Component Value Date   HGBA1C 4.9 05/31/2016   ------------------------------------------------------------------------------------------------------------------ No results for input(s): TSH, T4TOTAL, T3FREE, THYROIDAB in the last 72 hours.  Invalid input(s):  FREET3 ------------------------------------------------------------------------------------------------------------------ No results for input(s): VITAMINB12, FOLATE, FERRITIN, TIBC, IRON, RETICCTPCT in the last 72 hours.  Coagulation profile No results for input(s): INR, PROTIME in the last 168 hours.  No results for input(s): DDIMER in the last 72 hours.  Cardiac Enzymes No results for input(s): CKMB, TROPONINI, MYOGLOBIN in the last 168 hours.  Invalid input(s): CK ------------------------------------------------------------------------------------------------------------------    Component Value Date/Time   BNP 190.3 (H) 09/09/2016 0406    Micro Results Recent Results (from the past 240 hour(s))  Blood Culture (routine  x 2)     Status: None (Preliminary result)   Collection Time: 09-19-2016 11:48 PM  Result Value Ref Range Status   Specimen Description BLOOD LEFT FOREARM  Final   Special Requests IN PEDIATRIC BOTTLE Blood Culture adequate volume  Final   Culture   Final    NO GROWTH 3 DAYS Performed at [facility], [address], [place], [place] [address]    Report Status PENDING  Incomplete  Blood Culture (routine x 2)     Status: None (Preliminary result)   Collection Time: September 19, 2016 11:48 PM  Result Value Ref Range Status   Specimen Description BLOOD RIGHT FOREARM  Final   Special Requests   Final    BOTTLES DRAWN AEROBIC AND ANAEROBIC Blood Culture adequate volume   Culture   Final    NO GROWTH 3 DAYS Performed at [facility], [address], [place], [place] [address]    Report Status PENDING  Incomplete  MRSA PCR Screening     Status: None   Collection Time: 09-19-16 11:55 PM  Result Value Ref Range Status   MRSA by PCR NEGATIVE NEGATIVE Final    Comment:        The GeneXpert MRSA Assay (FDA approved for NASAL specimens only), is one component of a comprehensive MRSA colonization surveillance program. It is not intended to diagnose  MRSA infection nor to guide or monitor treatment for MRSA infections.   Culture, Urine     Status: None   Collection Time: 09/06/16 12:25 AM  Result Value Ref Range Status   Specimen Description URINE, CLEAN CATCH  Final   Special Requests NONE  Final   Culture   Final    NO GROWTH Performed at [facility], [address] [place], [place] [address]    Report Status 09/07/2016 FINAL  Final    Radiology Reports Dg Chest 2 View  Result Date: 09/08/2016 CLINICAL DATA:  SOB and cough today; pt right arm was paralyzed, unable to raise up for better lateral view EXAM: CHEST  2 VIEW COMPARISON:  09/07/2016 FINDINGS: Mild improvement in the irregular interstitial and intervening hazy airspace opacity noted in the right upper lobe inferiorly, bordering the minor fissure, and both lung bases, greater on the right. No new lung abnormalities. There are small bilateral pleural effusions.  No pneumothorax. Cardiac silhouette is normal in size. No mediastinal or hilar masses or convincing adenopathy. IMPRESSION: 1. Mild improvement in lung aeration from the previous day's exam. Some of this apparent change may be technical only, findings to suggest mildly improved multifocal pneumonia. 2. Small bilateral pleural effusions. No convincing pulmonary edema. Electronically Signed   By: Amie Portland M.D.   On: 09/08/2016 09:00   Dg Chest Port 1 View  Result Date: 09/09/2016 CLINICAL DATA:  Shortness of breath. EXAM: PORTABLE CHEST 1 VIEW COMPARISON:  09/08/2016 FINDINGS: The cardiac silhouette, mediastinal and hilar contours are stable. Persistent bilateral infiltrates and underlying emphysema. No pleural effusions or pneumothorax. IMPRESSION: Persistent bilateral infiltrates. Electronically Signed   By: Rudie Meyer M.D.   On: 09/09/2016 10:02   Dg Chest Port 1 View  Result Date: 09/08/2016 CLINICAL DATA:  Respiratory distress EXAM: PORTABLE CHEST 1 VIEW COMPARISON:  09/08/2016 FINDINGS: Patchy  airspace disease again noted throughout the right lung and left base. This is most pronounced in the right upper lobe with some improvement in both lung bases. Mild cardiomegaly. No effusions or acute bony abnormality. IMPRESSION: Patchy bilateral multifocal  airspace opacities, most confluent in the right upper lobe with some improvement in lung bases. Findings most compatible with multifocal pneumonia. Electronically Signed   By: Charlett Nose M.D.   On: 09/08/2016 23:33   Dg Chest Port 1 View  Result Date: 09/07/2016 CLINICAL DATA:  Shortness of breath. EXAM: PORTABLE CHEST 1 VIEW COMPARISON:  September 11, 2016. FINDINGS: Heart size is stable. Right mid and lower lung progressive infiltrate. Mild infiltrate left lung base noted on today's exam. No prominent pleural effusion. No pneumothorax. IMPRESSION: 1. Right mid and lower lung progressive infiltrate consistent with pneumonia. 2. Mild infiltrate noted in the left lung base on today's exam. Electronically Signed   By: Maisie Fus  Register   On: 09/07/2016 06:20   Dg Chest Portable 1 View  Result Date: 09/11/2016 CLINICAL DATA:  Altered mental status. Fever or shortness of breath. EXAM: PORTABLE CHEST 1 VIEW COMPARISON:  03/22/2016 ; 07/12/2008; chest CT -07/14/2008 FINDINGS: Grossly unchanged cardiac silhouette and mediastinal contours given decreased lung volumes and patient rotation. Thickening of the right paratracheal stripe is favored to be secondary to prominent vasculature. Potential developing airspace opacity within the right mid lung. No pleural effusion. No evidence of edema. Skin fold overlies the right lung apex. No pneumothorax. No acute osseus abnormalities. IMPRESSION: Findings worrisome for developing right mid lung pneumonia on this supine portable examination. Further evaluation with a PA and lateral chest radiograph may be obtained as clinically indicated. Electronically Signed   By: Simonne Come M.D.   On: 2016/09/11 17:58    Time Spent in  minutes  30   Susa Raring M.D on 09/10/2016 at 9:39 AM  Between 7am to 7pm - Pager - [phone] ( page via [email or web address], text pages only, please mention full 10 digit call back number). After 7pm go to [email or web address] - password [email or web address]

## 2016-09-10 NOTE — Consult Note (Signed)
Consultation Note Date: 09/10/2016   Patient Name: Neil Lambert  DOB: February 03, 1946  MRN: [id number]  Age / Sex: 71 y.o., male  PCP: Neil Matin, MD Referring Physician: Leroy Sea, MD  Reason for Consultation: Establishing goals of care  HPI/Patient Profile: 71 y.o. male  with past medical history of right sided hemiparesis, chronic pain, chronic hepatitis C infection, HLD, and depression, admitted with non productive cough and subjective fever, was diagnosed with sepsis due to pneumonia.  admitted on 08/18/2016     Clinical Assessment and Goals of Care:  Neil Lambert is in the hospital for sepsis, recurrent aspiration pneumonia, ongoing dysphagia, has remote history of stroke, reportedly also has high use of opioids. He was recommended to have dysphagia 3 diet with feeding assistance and aspiration precautions. He remains on broad spectrum antibiotics and is to undergo repeat MBS evaluation this morning. He is on supplemental O2 5-6 L [place].   A palliative consult has been placed for goals of care discussions.   Neil Lambert is resting in bed,he is awake, he complains of pain everywhere, he complains of headache. He is asking why he is not eating or drinking. His mouth is dry. I introduced myself and palliative care as follows: Palliative medicine is specialized medical care for people living with serious illness. It focuses on providing relief from the symptoms and stress of a serious illness. The goal is to improve quality of life for both the patient and the family.  Neil [place] is awake, reasonably alert. He has no immediate family in the area. He has a brother who lives in [place], he has a daughter, whom he is estranged from, who lives in [place]. He does not have contact information for either of them.   We talked about goals and values, after discussing the serious nature of his illness. Neil  Lambert wants the hospital doctors to make medically appropriate decisions for him. He does not want tubes and machines at end of life, he wishes to be kept as comfortable as possible if he is not going to get much better.   Call placed and discussed with contact listed as [place]. Neil Lambert is the patient's friend and [place], he has known the patient for 14 years. [place] says that when the patient came out of [place] facility, the staff at [facility] was in the process of contacting the state so that the patient could become a ward of the state. [place] wants to know how the patient is doing, but does not want to be made responsible for making medical decisions on his behalf.   Neil [place] was able to give me the patient's brother [place] contact [phone]: call placed and discussed with brother [place]. He states that there is no immediate family in the area, Neil [place] is the next of kin, he wants to know how the patient is doing. We talked about the patient's overall condition and that he will require comfort measures and hospice type care  if he has repeated aspiration episodes. Neil CanalesSteve is aware of the patient's stroke in the past and that overall he is not doing well.Neil CanalesSteve will try to get in touch with estranged daughter Neil Burtonmily to let her know of the patient's condition and situation.   OTHER Neil CanalesSteve brother is next of kin: [phone]. His contact info will be added to his demographics.  There is no health care power of attorney, no medical decision maker.  Patient wants the hospital doctors to use their best medical judgement for his care.     SUMMARY OF RECOMMENDATIONS    DNR DNI no PEG, discussed and confirmed with patient. Also discussed and agreed upon with brother Neil CanalesSteve, next of kin, who resides in [place].   Monitor PO intake, MBS results.   Recommend comfort feeds and hospice consult on d/c.  Either SNF with hospice if the patient is able to have some PO intake or  residential hospice if patient has minimal PO intake and ongoing pain and other symptoms.   Request case manager to see if [place] facility has started guardianship paperwork or not.   We will follow along Thank you for the consult.  Code Status/Advance Care Planning:  DNR    Symptom Management:    As above  Palliative Prophylaxis:   Bowel Regimen and Delirium Protocol   Psycho-social/Spiritual:   Desire for further Chaplaincy support:no  Additional Recommendations: Education on Hospice  Prognosis:   Guarded, likely few weeks, could be much shorter should the patient have a serious sepsis/aspiration episode.   Discharge Planning: To Be Determined      Primary Diagnoses: Present on Admission: . Sepsis (HCC) . Hepatitis C, chronic (HCC) . Chronic pain syndrome   I have reviewed the medical record, interviewed the patient and family, and examined the patient. The following aspects are pertinent.  Past Medical History:  Diagnosis Date  . Depression   . HCV (hepatitis C virus)   . Headache   . Hyperlipidemia   . Stroke [facility](HCC)    Social History   Social History  . Marital status: Single    Spouse name: N/A  . Number of children: N/A  . Years of education: N/A   Social History Main Topics  . Smoking status: Former Smoker    Types: Cigarettes  . Smokeless tobacco: Never Used  . Alcohol use 1.2 - 1.8 oz/week    2 - 3 Cans of beer per week     Comment: * packs a week  . Drug use: No  . Sexual activity: Yes   Other Topics Concern  . None   Social History Narrative  . None   No family history on file. Scheduled Meds: . aspirin EC  81 mg Oral Daily  . atorvastatin  10 mg Oral Once per day on Mon Wed Fri  . azithromycin  500 mg Oral Daily  . diazepam  2 mg Oral QHS  . enoxaparin (LOVENOX) injection  40 mg Subcutaneous QHS  . feeding supplement (ENSURE ENLIVE)  237 mL Oral BID BM  . gabapentin  300 mg Oral TID  . lidocaine  1 patch Transdermal  Daily  . pantoprazole  40 mg Oral BID  . polyethylene glycol  17 g Oral Daily  . polyvinyl alcohol  1 drop Both Eyes BID  . prazosin  1 mg Oral QHS  . QUEtiapine  12.5 mg Oral QHS  . saccharomyces boulardii  250 mg Oral Daily   Continuous Infusions: . ampicillin-sulbactam (UNASYN)  IV    . dextrose    . sodium chloride     PRN Meds:.acetaminophen, albuterol, haloperidol lactate, ibuprofen, ipratropium-albuterol, sodium chloride Medications Prior to Admission:  Prior to Admission medications   Medication Sig Start Date End Date Taking? Authorizing Provider  alendronate (FOSAMAX) 70 MG tablet Take 70 mg by mouth once a week. Take with a full glass of water on an empty stomach.   Yes [provider]  aspirin EC 81 MG tablet Take 81 mg by mouth daily.   Yes [provider]  atorvastatin (LIPITOR) 10 MG tablet Take 10 mg by mouth as directed. At bedtime on MWF.   Yes [provider]  diazepam (VALIUM) 5 MG tablet Take 5 mg by mouth 2 (two) times daily.    Yes [provider]  gabapentin (NEURONTIN) 100 MG capsule Take 100 mg by mouth 2 (two) times daily.    Yes [provider]  Lidocaine 4 % PTCH Apply topically. Apply to both hips in th morning   Yes [provider]  oxyCODONE (OXY IR/ROXICODONE) 5 MG immediate release tablet Take 5 mg by mouth every 6 (six) hours as needed for severe pain.   Yes [provider]  polyethylene glycol (MIRALAX / GLYCOLAX) packet Take 17 g by mouth daily.   Yes [provider]  prazosin (MINIPRESS) 1 MG capsule Take 1 mg by mouth at bedtime.   Yes [provider]  Propylene Glycol (SYSTANE BALANCE OP) Instill 1 drop in both eyes 2 times a day for dry eyes   Yes [provider]  QUEtiapine (SEROQUEL) 25 MG tablet Take 12.5 mg by mouth at bedtime.   Yes [provider]  saccharomyces boulardii (FLORASTOR) 250 MG capsule Take 250 mg by mouth daily.   Yes [provider]  ondansetron (ZOFRAN) 4 MG tablet Take 4 mg by mouth every 8 (eight) hours as needed for nausea or vomiting.    [provider]  sodium phosphate (FLEET) 7-19 GM/118ML ENEM Place 1 enema rectally daily as needed for severe constipation.    [provider]   Allergies  Allergen Reactions  . Codeine Nausea And Vomiting and Other (See Comments)    Sick on my stomach  . Sulfa Antibiotics Nausea And Vomiting and Other (See Comments)    sick  . Lexapro [Escitalopram Oxalate] Other (See Comments)    Unknown Reaction   Review of Systems +generalized pain + headache  Physical Exam Awake, Appears fatigued, chronic right-sided hemiparesis  coarse bilateral breath sounds Regular.   +ve B.Sounds, Abd Soft, No tenderness,   No Cyanosis, Clubbing or edema, No [place] or bruise Awake alert Answers questions appropriately  Vital Signs: BP 139/61 (BP Location: Right Arm)   Pulse 99   Temp 99.4 F (37.4 C)   Resp 19   Ht 5\' 9"  (1.753 m)   Wt 75.3 kg (166 lb 0.1 oz)   SpO2 (!) 89%   BMI 24.51 kg/m  Pain Assessment: 0-10 POSS *See Group Information*: 1-Acceptable,Awake and alert Pain Score: Asleep   SpO2: SpO2: (!) 89 % O2 Device:SpO2: (!) 89 % O2 Flow Rate: .O2 Flow Rate (L/min): 6 L/min  IO: Intake/output summary:  Intake/Output Summary (Last 24 hours) at 09/10/16 1015 Last data filed at 09/10/16 0438  Gross per 24 hour  Intake              101 ml  Output  3550 ml  Net            -3449 ml    LBM: Last BM Date: 09/04/16 Baseline Weight: Weight: 70.3 kg (155 lb) Most recent weight: Weight: 75.3 kg (166 lb 0.1 oz)     Palliative Assessment/Data:   Flowsheet Rows     Most Recent Value  Intake Tab  Referral Department  Hospitalist  Unit at Time of Referral  Med/Surg Unit  Palliative Care Primary Diagnosis  Pulmonary  Palliative Care Type  [facility]  Reason for referral  Clarify Goals of Care  Date first seen by  Palliative Care  09/10/16  Clinical Assessment  Palliative Performance Scale Score  30%  Pain Max last 24 hours  5  Pain Min Last 24 hours  4  Dyspnea Max Last 24 Hours  5  Dyspnea Min Last 24 hours  4  Psychosocial & Spiritual Assessment  Palliative Care Outcomes  Patient/Family meeting held?  Yes  Who was at the meeting?  patient   Palliative Care Outcomes  Clarified goals of care      Time In:  9.30 Time Out:  10.40 Time Total:  70 min  Greater than 50%  of this time was spent counseling and coordinating care related to the above assessment and plan.  Signed by: Rosalin Hawking, MD  [phone]  Please contact Palliative Medicine Team phone at [phone] for questions and concerns.  For individual provider: See Loretha Stapler

## 2016-09-11 LAB — CBC
HCT: 29.1 % — ABNORMAL LOW (ref 39.0–52.0)
Hemoglobin: 9.2 g/dL — ABNORMAL LOW (ref 13.0–17.0)
MCH: 23.7 pg — AB (ref 26.0–34.0)
MCHC: 31.6 g/dL (ref 30.0–36.0)
MCV: 74.8 fL — ABNORMAL LOW (ref 78.0–100.0)
PLATELETS: 170 10*3/uL (ref 150–400)
RBC: 3.89 MIL/uL — AB (ref 4.22–5.81)
RDW: 18.4 % — ABNORMAL HIGH (ref 11.5–15.5)
WBC: 13.8 10*3/uL — AB (ref 4.0–10.5)

## 2016-09-11 LAB — BLOOD CULTURE ID PANEL (REFLEXED)
Acinetobacter baumannii: NOT DETECTED
CANDIDA ALBICANS: NOT DETECTED
CANDIDA TROPICALIS: NOT DETECTED
Candida glabrata: NOT DETECTED
Candida krusei: NOT DETECTED
Candida parapsilosis: NOT DETECTED
ENTEROBACTERIACEAE SPECIES: NOT DETECTED
Enterobacter cloacae complex: NOT DETECTED
Enterococcus species: NOT DETECTED
Escherichia coli: NOT DETECTED
HAEMOPHILUS INFLUENZAE: NOT DETECTED
KLEBSIELLA PNEUMONIAE: NOT DETECTED
Klebsiella oxytoca: NOT DETECTED
Listeria monocytogenes: NOT DETECTED
METHICILLIN RESISTANCE: NOT DETECTED
NEISSERIA MENINGITIDIS: NOT DETECTED
PROTEUS SPECIES: NOT DETECTED
Pseudomonas aeruginosa: NOT DETECTED
STAPHYLOCOCCUS SPECIES: DETECTED — AB
STREPTOCOCCUS SPECIES: NOT DETECTED
Serratia marcescens: NOT DETECTED
Staphylococcus aureus (BCID): NOT DETECTED
Streptococcus agalactiae: NOT DETECTED
Streptococcus pneumoniae: NOT DETECTED
Streptococcus pyogenes: NOT DETECTED

## 2016-09-11 LAB — CULTURE, BLOOD (ROUTINE X 2)
CULTURE: NO GROWTH
CULTURE: NO GROWTH
SPECIAL REQUESTS: ADEQUATE
Special Requests: ADEQUATE

## 2016-09-11 LAB — MAGNESIUM: MAGNESIUM: 2.1 mg/dL (ref 1.7–2.4)

## 2016-09-11 LAB — BASIC METABOLIC PANEL
Anion gap: 8 (ref 5–15)
BUN: 20 mg/dL (ref 6–20)
CO2: 24 mmol/L (ref 22–32)
Calcium: 7.6 mg/dL — ABNORMAL LOW (ref 8.9–10.3)
Chloride: 110 mmol/L (ref 101–111)
Creatinine, Ser: 0.85 mg/dL (ref 0.61–1.24)
Glucose, Bld: 126 mg/dL — ABNORMAL HIGH (ref 65–99)
POTASSIUM: 3.7 mmol/L (ref 3.5–5.1)
SODIUM: 142 mmol/L (ref 135–145)

## 2016-09-11 MED ORDER — GLYCOPYRROLATE 0.2 MG/ML IJ SOLN
0.2000 mg | INTRAMUSCULAR | Status: DC | PRN
  Filled 2016-09-11: qty 1

## 2016-09-11 MED ORDER — GLYCOPYRROLATE 0.2 MG/ML IJ SOLN
0.2000 mg | INTRAMUSCULAR | Status: DC | PRN
  Administered 2016-09-12: 0.2 mg via INTRAVENOUS
  Filled 2016-09-11 (×3): qty 1

## 2016-09-11 MED ORDER — LORAZEPAM 1 MG PO TABS: 1.0000 mg | ORAL_TABLET | ORAL | Status: DC | PRN

## 2016-09-11 MED ORDER — PAROXETINE HCL 20 MG PO TABS
10.0000 mg | ORAL_TABLET | Freq: Every day | ORAL | Status: DC
  Filled 2016-09-11: qty 1

## 2016-09-11 MED ORDER — SODIUM CHLORIDE 0.9 % IV SOLN
2.0000 mg/h | INTRAVENOUS | Status: DC
  Administered 2016-09-11 (×3): 2 mg/h via INTRAVENOUS
  Filled 2016-09-11: qty 10

## 2016-09-11 MED ORDER — MORPHINE BOLUS VIA INFUSION
2.0000 mg | INTRAVENOUS | Status: DC | PRN
  Administered 2016-09-11 – 2016-09-12 (×2): 2 mg via INTRAVENOUS
  Filled 2016-09-11: qty 2

## 2016-09-11 MED ORDER — GLYCOPYRROLATE 1 MG PO TABS: 1.0000 mg | ORAL_TABLET | ORAL | Status: DC | PRN

## 2016-09-11 MED ORDER — IPRATROPIUM-ALBUTEROL 0.5-2.5 (3) MG/3ML IN SOLN
3.0000 mL | RESPIRATORY_TRACT | Status: DC | PRN
  Administered 2016-09-11: 3 mL via RESPIRATORY_TRACT
  Filled 2016-09-11: qty 3

## 2016-09-11 MED ORDER — LORAZEPAM 2 MG/ML PO CONC: 1.0000 mg | ORAL | Status: DC | PRN

## 2016-09-11 MED ORDER — LORAZEPAM 2 MG/ML IJ SOLN: 1.0000 mg | INTRAMUSCULAR | Status: DC | PRN

## 2016-09-11 NOTE — Progress Notes (Signed)
Order for discontinue SLP received, MD please reorder if desire.  Thanks Donavan Burnetamara Jasline Buskirk, MS [facility] [phone]

## 2016-09-11 NOTE — Progress Notes (Signed)
Pt began to have increased audible wheezing; stuggling, NRB, then admin breathing [place] Duoneb. PT could not maintain sats on [place] or venti; back on NRB. Dr aware. Admin Fentanyl for air hunger.

## 2016-09-11 NOTE — Progress Notes (Addendum)
LCSW following for disposition of needs:  From [facility]  Chart reviewed and discussed case this morning in progression. Palliative team now on board and appears to be comfort feeds, however disposition remains unclear as if patient will require hospice facility or can return to SNF with hospice.  1. LCSW contacted facility with regards to decision maker on file. Brother: Neil Lambert  [phone] Facility is reviewing chart and will follow up. Completed, see above.  2.Also asked facility to see how [facility] vs private pay vs medicaid.  No medicaid on file, however it may be active and just not documented.  This is necessary if patient returns to SNF with hospice. Patient is currently private pay at facility for room and board, thus can pay privately if wanting to go back to SNF with Hospice. Facility aware of comfort feeds and agreeable.  Liaison called is out of the building however she will find out and follow up with information.  LCSW will continue to assist with disposition and questions listed above provide answers.  Neil EmoryHannah Jonovan Boedecker LCSW, MSW Clinical Social Work: Optician, dispensingystem [employer] Coverage for :  [phone]

## 2016-09-11 NOTE — Progress Notes (Signed)
Palliative Care Encounter  I met today with Neil Lambert.  He has been noted to be intermittently awake but was very awake at time of out encounter.  He was able to participate in conversation with me.  We discussed clinical course as well as wishes moving forward.  He told me repeatedly that his is ready to "check out."  I asked him to clarify what he means by this and he stated clearly that he is ready to die and only wants to not hurt anymore.  We discussed difference between a aggressive medical intervention path and a palliative, comfort focused care path.  He clearly stated that he wants only to be comfortable as he reaches the end of his life.     He asked me to call and also discuss with his brother, Richardson Landry.  I called and was able to reach Richardson Landry and let him know of conversation and plan for transition to full comfort care.  He reports agreeing that his brother has been suffering for a long time and he wants him to be comfortable and have dignity at the end of his life.  I informed him that I felt prognosis with switch to full comfort is likely hours to days.    Questions and concerns addressed.   PMT will continue to support holistically.  Orders updated using end of life order set.    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  Will continue to titrate as needed to ensure his comfort.  Discussed with Dr. Candiss Norse and his bedside RN.  Total time: 50 minutes Greater than 50%  of this time was spent counseling and coordinating care related to the above assessment and plan.  Micheline Rough, MD [place] [facility] [phone]

## 2016-09-11 NOTE — Progress Notes (Signed)
PHARMACY - PHYSICIAN COMMUNICATION CRITICAL VALUE ALERT - BLOOD CULTURE IDENTIFICATION (BCID)  Results for orders placed or performed during the hospital encounter of 09/06/2016  Blood Culture ID Panel (Reflexed) (Collected: 09/10/2016  6:35 AM)  Result Value Ref Range   Enterococcus species NOT DETECTED NOT DETECTED   Listeria monocytogenes NOT DETECTED NOT DETECTED   Staphylococcus species DETECTED (A) NOT DETECTED   Staphylococcus aureus NOT DETECTED NOT DETECTED   Methicillin resistance NOT DETECTED NOT DETECTED   Streptococcus species NOT DETECTED NOT DETECTED   Streptococcus agalactiae NOT DETECTED NOT DETECTED   Streptococcus pneumoniae NOT DETECTED NOT DETECTED   Streptococcus pyogenes NOT DETECTED NOT DETECTED   Acinetobacter baumannii NOT DETECTED NOT DETECTED   Enterobacteriaceae species NOT DETECTED NOT DETECTED   Enterobacter cloacae complex NOT DETECTED NOT DETECTED   Escherichia coli NOT DETECTED NOT DETECTED   Klebsiella oxytoca NOT DETECTED NOT DETECTED   Klebsiella pneumoniae NOT DETECTED NOT DETECTED   Proteus species NOT DETECTED NOT DETECTED   Serratia marcescens NOT DETECTED NOT DETECTED   Haemophilus influenzae NOT DETECTED NOT DETECTED   Neisseria meningitidis NOT DETECTED NOT DETECTED   Pseudomonas aeruginosa NOT DETECTED NOT DETECTED   Candida albicans NOT DETECTED NOT DETECTED   Candida glabrata NOT DETECTED NOT DETECTED   Candida krusei NOT DETECTED NOT DETECTED   Candida parapsilosis NOT DETECTED NOT DETECTED   Candida tropicalis NOT DETECTED NOT DETECTED    Name of physician (or Provider) Contacted: Dr. Thedore MinsSingh  Changes to prescribed antibiotics required: no changes  Neil Lambert 09/11/2016  10:00 AM

## 2016-09-11 NOTE — Progress Notes (Signed)
@IPLOG @        PROGRESS NOTE                                                                                                                                                                                                             Patient Demographics:    Neil Lambert, is a 71 y.o. male, DOB - [birth year], ZOX:[id number]  Admit date - September 27, 2016   Admitting Physician Michael Litter, MD  Outpatient Primary MD for the patient is Jarome Matin, MD  LOS - 6  Chief Complaint  Patient presents with  . Fever       Brief Narrative   Neil Lambert is a 71 y.o. gentleman with a history of CVA with right sided hemiparesis, chronic pain, chronic hepatitis C infection, HLD, and depression who lives in a group home.  He reports 3-4 days of nonproductive cough and subjective fever, was diagnosed with sepsis due to Recurrent aspiration pneumonia. He is DO NOT RESUSCITATE and prognosis is poor, if he continues to decline we will focus on comfort care, for now continue medical treatment, palliative care on board.    Subjective:   Patient in bed, appears comfortable, denies any headache, no fever, no chest pain or pressure, Positive cough with shortness of breath , no abdominal pain. No focal weakness.   Assessment  & Plan :     1. Sepsis. Due to recurrent aspiration pneumonia And acute on chronic hypoxic respiratory failure. This likely is combination of his dysphagia arising from previous stroke and use of high-dose narcotics, speech is following currently on dysphagia 3 diet with feeding assistance and aspiration precautions. Suspect he aspirated again night of 09/08/2016 and now again on 09/11/2016.   He is having recurrent aspiration He has aspiration office oral secretions and has aspirated at least 3 times while hospitalized here despite all precautions, had counseled him strictly that he has to minimize narcotic and benzodiazepine intake to minimize his chances of aspiration, his  prognosis now appears poor with this reoccurring cycle of aspiration, we'll continue Unasyn with azithromycin, speech and palliative care following.  He aspirated again night of 09/08/2016 and then once more night of 09-2016, he gets profound hypoxemic respiratory failure requiring 6-8 L nasal cannula oxygen, he refuses Ventimask on nonrebreather mask, he is currently on full liquid diet and still aspirating, seen by palliative care and does not wish to get feeding tube etc., he is overall DO NOT RESUSCITATE.  I had detailed discussions with him multiple times and have explained to  him that his prognosis unfortunately is extremely poor, if he continues to decline he wishes not to be put on life support, will continue antibiotic therapy follow-up, continue supportive care, despite medical treatment if he continues to decline we will focus on comfort.   2. History of CVA with right-sided hemiparesis. Continue statin, aspirin and Plavix for secondary prevention, he is refusing PT eval.  3. Dyslipidemia. On statin continue.  4. Chronic pain. Increased Neurontin dose on 09/10/2016, minimize narcotics due to narcotics worsening his mentation and aspiration.  5. Chronic hep C infection. Outpatient follow-up with PCP ID and GI as needed.  7. Hematuria. Follow with PCP and outpatient neurology.  8. Anemia. Normocytic. Expect fall with dilution and aggressive hydration, check anemia panel type and screen. He is on PPI for now. No signs of ongoing GI blood loss.  9. Possible relative adrenal insufficiency, random cortisol when he was hypotensive was around 3, surprisingly cosyntropin stim test was stable, he is now off steroids and blood pressure are stable.   Diet : Diet full liquid Room service appropriate? Yes; Fluid consistency: Nectar Thick    Family Communication  :  None  Code Status :  DNR  Disposition Plan  :  SNF, may require SNF with palliative care.  Consults  :  Palliative  care  Procedures  :  None  DVT Prophylaxis  :  Lovenox   Lab Results  Component Value Date   PLT 170 09/11/2016    Inpatient Medications  Scheduled Meds: . aspirin EC  81 mg Oral Daily  . atorvastatin  10 mg Oral Once per day on Mon Wed Fri  . azithromycin  500 mg Oral Daily  . enoxaparin (LOVENOX) injection  40 mg Subcutaneous QHS  . feeding supplement (ENSURE ENLIVE)  237 mL Oral BID BM  . gabapentin  300 mg Oral TID  . lidocaine  1 patch Transdermal Daily  . pantoprazole  40 mg Oral BID  . PARoxetine  10 mg Oral Daily  . polyethylene glycol  17 g Oral Daily  . polyvinyl alcohol  1 drop Both Eyes BID  . prazosin  1 mg Oral QHS  . QUEtiapine  12.5 mg Oral QHS  . saccharomyces boulardii  250 mg Oral Daily   Continuous Infusions: . ampicillin-sulbactam (UNASYN) IV Stopped (09/11/16 0726)  . dextrose 50 mL (09/10/16 0930)  . sodium chloride     PRN Meds:.acetaminophen, albuterol, fentaNYL (SUBLIMAZE) injection, haloperidol lactate, ibuprofen, ipratropium-albuterol, sodium chloride  Antibiotics  :    Anti-infectives    Start     Dose/Rate Route Frequency Ordered Stop   09/10/16 1200  Ampicillin-Sulbactam (UNASYN) 3 g in sodium chloride 0.9 % 100 mL IVPB     3 g 200 mL/hr over 30 Minutes Intravenous Every 6 hours 09/10/16 1013     09/08/16 2200  amoxicillin-clavulanate (AUGMENTIN) 875-125 MG per tablet 1 tablet  Status:  Discontinued     1 tablet Oral Every 12 hours 09/08/16 1150 09/10/16 0938   09/08/16 1400  azithromycin (ZITHROMAX) tablet 500 mg     500 mg Oral Daily 09/08/16 1150     09/06/16 0800  vancomycin (VANCOCIN) IVPB 1000 mg/200 mL premix  Status:  Discontinued     1,000 mg 200 mL/hr over 60 Minutes Intravenous Every 12 hours 2016-09-28 1916 09/08/16 1150   09/06/16 0200  piperacillin-tazobactam (ZOSYN) IVPB 3.375 g  Status:  Discontinued     3.375 g 12.5 mL/hr over 240 Minutes Intravenous Every 8 hours  08/25/2016 1916 09/08/16 1150   08/13/2016 1730   piperacillin-tazobactam (ZOSYN) IVPB 3.375 g     3.375 g 100 mL/hr over 30 Minutes Intravenous  Once 08/12/2016 1727 08/19/2016 1844   09/04/2016 1730  vancomycin (VANCOCIN) IVPB 1000 mg/200 mL premix     1,000 mg 200 mL/hr over 60 Minutes Intravenous  Once 08/16/2016 1727 08/25/2016 2103         Objective:   Vitals:   09/10/16 0749 09/10/16 1543 09/10/16 2103 09/11/16 0521  BP:  (!) 148/80 (!) 151/81 (!) 145/83  Pulse:  63 96 87  Resp:  15 16 16   Temp: 99.4 F (37.4 C) 98.2 F (36.8 C) 98.1 F (36.7 C) 99.9 F (37.7 C)  TempSrc:  Oral Oral Oral  SpO2:  (!) 80% 90% 91%  Weight:      Height:        Wt Readings from Last 3 Encounters:  09/06/16 75.3 kg (166 lb 0.1 oz)  08/20/2016 70.3 kg (155 lb)  08/28/16 70.3 kg (155 lb)     Intake/Output Summary (Last 24 hours) at 09/11/16 1000 Last data filed at 09/11/16 0700  Gross per 24 hour  Intake              400 ml  Output              550 ml  Net             -150 ml     Physical Exam  Awake But appears tired, chronic right-sided hemiparesis [place].AT,PERRAL Supple Neck,No JVD, No cervical lymphadenopathy appriciated.  Symmetrical Chest wall movement, Good air movement bilaterally, coarse bilateral breath sounds RRR,No Gallops,Rubs or new Murmurs, No Parasternal Heave +ve B.Sounds, Abd Soft, No tenderness, No organomegaly appriciated, No rebound - guarding or rigidity. No Cyanosis, Clubbing or edema, No new Rash or bruise    Data Review:    CBC  Recent Labs Lab 08/16/2016 1805  09/07/16 0321 09/08/16 0951 09/09/16 0406 09/10/16 0635 09/11/16 0346  WBC 10.1  < > 18.9* 23.7* 20.3* 14.4* 13.8*  HGB 10.6*  < > 9.1* 9.6* 9.3* 8.9* 9.2*  HCT 33.3*  < > 27.8* 29.9* 29.3* 27.6* 29.1*  PLT 215  < > 110* 167 128* 172 170  MCV 76.4*  < > 74.9* 76.5* 75.7* 74.8* 74.8*  MCH 24.3*  < > 24.5* 24.6* 24.0* 24.1* 23.7*  MCHC 31.8  < > 32.7 32.1 31.7 32.2 31.6  RDW 17.0*  < > 17.1* 17.7* 17.7* 18.0* 18.4*  LYMPHSABS 1.9  --   --   --    --   --   --   MONOABS 0.4  --   --   --   --   --   --   EOSABS 0.2  --   --   --   --   --   --   BASOSABS 0.0  --   --   --   --   --   --   < > = values in this interval not displayed.  Chemistries   Recent Labs Lab 09/08/2016 1805 09/06/16 0332 09/07/16 0321 09/08/16 0951 09/09/16 0406 09/10/16 0635 09/11/16 0346  NA 138 138 138 140 144  --  142  K 4.1 3.6 4.3 4.1 5.2* 4.0 3.7  CL 108 114* 118* 117* 115*  --  110  CO2 22 19* 16* 21* 23  --  24  GLUCOSE 87 104* 144* 128* 124*  --  126*  BUN 9 13 11 13 18   --  20  CREATININE 0.80 0.91 0.75 0.75 0.93  --  0.85  CALCIUM 8.3* 7.0* 7.0* 7.4* 7.4*  --  7.6*  MG  --   --   --   --   --   --  2.1  AST 120* 86*  --   --   --   --   --   ALT 52 38  --   --   --   --   --   ALKPHOS 114 71  --   --   --   --   --   BILITOT 1.6* 1.6*  --   --   --   --   --    ------------------------------------------------------------------------------------------------------------------ No results for input(s): CHOL, HDL, LDLCALC, TRIG, CHOLHDL, LDLDIRECT in the last 72 hours.  Lab Results  Component Value Date   HGBA1C 4.9 05/31/2016   ------------------------------------------------------------------------------------------------------------------ No results for input(s): TSH, T4TOTAL, T3FREE, THYROIDAB in the last 72 hours.  Invalid input(s): FREET3 ------------------------------------------------------------------------------------------------------------------ No results for input(s): VITAMINB12, FOLATE, FERRITIN, TIBC, IRON, RETICCTPCT in the last 72 hours.  Coagulation profile No results for input(s): INR, PROTIME in the last 168 hours.  No results for input(s): DDIMER in the last 72 hours.  Cardiac Enzymes No results for input(s): CKMB, TROPONINI, MYOGLOBIN in the last 168 hours.  Invalid input(s): CK ------------------------------------------------------------------------------------------------------------------    Component  Value Date/Time   BNP 190.3 (H) 09/09/2016 0406    Micro Results Recent Results (from the past 240 hour(s))  Blood Culture (routine x 2)     Status: None (Preliminary result)   Collection Time: 2016-09-07 11:48 PM  Result Value Ref Range Status   Specimen Description BLOOD LEFT FOREARM  Final   Special Requests IN PEDIATRIC BOTTLE Blood Culture adequate volume  Final   Culture   Final    NO GROWTH 4 DAYS Performed at [facility], [address] [place], [place] [address]    Report Status PENDING  Incomplete  Blood Culture (routine x 2)     Status: None (Preliminary result)   Collection Time: September 07, 2016 11:48 PM  Result Value Ref Range Status   Specimen Description BLOOD RIGHT FOREARM  Final   Special Requests   Final    BOTTLES DRAWN AEROBIC AND ANAEROBIC Blood Culture adequate volume   Culture   Final    NO GROWTH 4 DAYS Performed at [facility], [address], [place], [place] [address]    Report Status PENDING  Incomplete  MRSA PCR Screening     Status: None   Collection Time: 2016/09/07 11:55 PM  Result Value Ref Range Status   MRSA by PCR NEGATIVE NEGATIVE Final    Comment:        The GeneXpert MRSA Assay (FDA approved for NASAL specimens only), is one component of a comprehensive MRSA colonization surveillance program. It is not intended to diagnose MRSA infection nor to guide or monitor treatment for MRSA infections.   Culture, Urine     Status: None   Collection Time: 09/06/16 12:25 AM  Result Value Ref Range Status   Specimen Description URINE, CLEAN CATCH  Final   Special Requests NONE  Final   Culture   Final    NO GROWTH Performed at [facility], [address] [place], [place] [address]    Report Status 09/07/2016 FINAL  Final  Culture, blood (routine x 2)  Status: None (Preliminary result)   Collection Time: 09/10/16  6:35 AM  Result Value Ref Range Status   Specimen Description BLOOD LEFT HAND  Final   Special Requests    Final    BOTTLES DRAWN AEROBIC ONLY Blood Culture adequate volume   Culture  Setup Time   Final    GRAM POSITIVE COCCI IN CLUSTERS AEROBIC BOTTLE ONLY CRITICAL RESULT CALLED TO, READ BACK BY AND VERIFIED WITH: T. GREEN, RPHARMD (WL) AT 0905 ON 09/11/16 BY C. JESSUP, MLT. Performed at [facility], [address] [place], [place] [address]    Culture GRAM POSITIVE COCCI  Final   Report Status PENDING  Incomplete  Blood Culture ID Panel (Reflexed)     Status: Abnormal   Collection Time: 09/10/16  6:35 AM  Result Value Ref Range Status   Enterococcus species NOT DETECTED NOT DETECTED Final   Listeria monocytogenes NOT DETECTED NOT DETECTED Final   Staphylococcus species DETECTED (A) NOT DETECTED Final    Comment: Methicillin (oxacillin) susceptible coagulase negative staphylococcus. Possible blood culture contaminant (unless isolated from more than one blood culture draw or clinical case suggests pathogenicity). No antibiotic treatment is indicated for blood  culture contaminants. CRITICAL RESULT CALLED TO, READ BACK BY AND VERIFIED WITH: T. GREEN, RPHARMD ([facility]) AT 0905 ON 09/11/16 BY C. JESSUP, MLT.    Staphylococcus aureus NOT DETECTED NOT DETECTED Final   Methicillin resistance NOT DETECTED NOT DETECTED Final   Streptococcus species NOT DETECTED NOT DETECTED Final   Streptococcus agalactiae NOT DETECTED NOT DETECTED Final   Streptococcus pneumoniae NOT DETECTED NOT DETECTED Final   Streptococcus pyogenes NOT DETECTED NOT DETECTED Final   Acinetobacter baumannii NOT DETECTED NOT DETECTED Final   Enterobacteriaceae species NOT DETECTED NOT DETECTED Final   Enterobacter cloacae complex NOT DETECTED NOT DETECTED Final   Escherichia coli NOT DETECTED NOT DETECTED Final   Klebsiella oxytoca NOT DETECTED NOT DETECTED Final   Klebsiella pneumoniae NOT DETECTED NOT DETECTED Final   Proteus species NOT DETECTED NOT DETECTED Final   Serratia marcescens NOT DETECTED NOT DETECTED Final    Haemophilus influenzae NOT DETECTED NOT DETECTED Final   Neisseria meningitidis NOT DETECTED NOT DETECTED Final   Pseudomonas aeruginosa NOT DETECTED NOT DETECTED Final   Candida albicans NOT DETECTED NOT DETECTED Final   Candida glabrata NOT DETECTED NOT DETECTED Final   Candida krusei NOT DETECTED NOT DETECTED Final   Candida parapsilosis NOT DETECTED NOT DETECTED Final   Candida tropicalis NOT DETECTED NOT DETECTED Final    Comment: Performed at [facility], [address], [place], [place] [address]    Radiology Reports Dg Chest 2 View  Result Date: 09/08/2016 CLINICAL DATA:  SOB and cough today; pt right arm was paralyzed, unable to raise up for better lateral view EXAM: CHEST  2 VIEW COMPARISON:  09/07/2016 FINDINGS: Mild improvement in the irregular interstitial and intervening hazy airspace opacity noted in the right upper lobe inferiorly, bordering the minor fissure, and both lung bases, greater on the right. No new lung abnormalities. There are small bilateral pleural effusions.  No pneumothorax. Cardiac silhouette is normal in size. No mediastinal or hilar masses or convincing adenopathy. IMPRESSION: 1. Mild improvement in lung aeration from the previous day's exam. Some of this apparent change may be technical only, findings to suggest mildly improved multifocal pneumonia. 2. Small bilateral pleural effusions. No convincing pulmonary edema. Electronically Signed   By: Amie Portland M.D.   On: 09/08/2016 09:00  Dg Chest Port 1 View  Result Date: 09/09/2016 CLINICAL DATA:  Shortness of breath. EXAM: PORTABLE CHEST 1 VIEW COMPARISON:  09/08/2016 FINDINGS: The cardiac silhouette, mediastinal and hilar contours are stable. Persistent bilateral infiltrates and underlying emphysema. No pleural effusions or pneumothorax. IMPRESSION: Persistent bilateral infiltrates. Electronically Signed   By: Rudie Meyer M.D.   On: 09/09/2016 10:02   Dg Chest Port 1 View  Result Date:  09/08/2016 CLINICAL DATA:  Respiratory distress EXAM: PORTABLE CHEST 1 VIEW COMPARISON:  09/08/2016 FINDINGS: Patchy airspace disease again noted throughout the right lung and left base. This is most pronounced in the right upper lobe with some improvement in both lung bases. Mild cardiomegaly. No effusions or acute bony abnormality. IMPRESSION: Patchy bilateral multifocal airspace opacities, most confluent in the right upper lobe with some improvement in lung bases. Findings most compatible with multifocal pneumonia. Electronically Signed   By: Charlett Nose M.D.   On: 09/08/2016 23:33   Dg Chest Port 1 View  Result Date: 09/07/2016 CLINICAL DATA:  Shortness of breath. EXAM: PORTABLE CHEST 1 VIEW COMPARISON:  09-25-2016. FINDINGS: Heart size is stable. Right mid and lower lung progressive infiltrate. Mild infiltrate left lung base noted on today's exam. No prominent pleural effusion. No pneumothorax. IMPRESSION: 1. Right mid and lower lung progressive infiltrate consistent with pneumonia. 2. Mild infiltrate noted in the left lung base on today's exam. Electronically Signed   By: Maisie Fus  Register   On: 09/07/2016 06:20   Dg Chest Portable 1 View  Result Date: 2016-09-25 CLINICAL DATA:  Altered mental status. Fever or shortness of breath. EXAM: PORTABLE CHEST 1 VIEW COMPARISON:  03/22/2016 ; 07/12/2008; chest CT -07/14/2008 FINDINGS: Grossly unchanged cardiac silhouette and mediastinal contours given decreased lung volumes and patient rotation. Thickening of the right paratracheal stripe is favored to be secondary to prominent vasculature. Potential developing airspace opacity within the right mid lung. No pleural effusion. No evidence of edema. Skin fold overlies the right lung apex. No pneumothorax. No acute osseus abnormalities. IMPRESSION: Findings worrisome for developing right mid lung pneumonia on this supine portable examination. Further evaluation with a PA and lateral chest radiograph may be obtained  as clinically indicated. Electronically Signed   By: Simonne Come M.D.   On: 2016/09/25 17:58   Dg Swallowing Func-speech Pathology  Result Date: 09/10/2016 Objective Swallowing Evaluation: Type of Study: MBS-Modified Barium Swallow Study Patient Details Name: JAYMIEN LANDIN MRN: [id number] Date of Birth: Dec 17, 1945 Today's Date: 09/10/2016 Time: SLP Start Time (ACUTE ONLY): 1259-SLP Stop Time (ACUTE ONLY): 1328 SLP Time Calculation (min) (ACUTE ONLY): 29 min Past Medical History: Past Medical History: Diagnosis Date . Depression  . HCV (hepatitis C virus)  . Headache  . Hyperlipidemia  . Stroke [facility]  Past Surgical History: Past Surgical History: Procedure Laterality Date . CHOLECYSTECTOMY   . FOOT SURGERY    with metal brace in but  not any more   worn  brace on rt foot for 6 yrs HPI: 71 yo male [facility] with fever, AMS, Pt with ? right upper middle lobe infiltrates.  CXR 09/09/16 showed persistent bilateral infiltrates.  Pt also with PMH + for prior CVA with right HP, chronic pain, Hep C, hematuria, pain medication usage ETOH and prior smoker.  Swallow evaluation ordered.  Pt denies significant problems with swallowing, states he is near baseline.  MBS ordered due to concern for recurrent aspiration.  Per RN, concern is present for pt to recurrently aspirate when taking pain medication and  becoming lethargic.    Subjective: pt awake in chair Assessment / Plan / Recommendation CHL IP CLINICAL IMPRESSIONS 09/10/2016 Clinical Impression Pt presents with moderate oropharyngeal sensorimotor based dysphagia likely due to his late effects of CVA with exacerbation d/t deconditioning.   Delayed oral transiting and decreased lingual control noted with pt requiring 7 seconds to masticate small moistened cracker bolus.  Poor tongue base retraction and impaired epiglottic deflection allowed post=swallow residuals with no awareness.  Pt was overtly coughing on a few occasions during MBS - suspect barium mixed with oropharyngeal  secretions and were aspirated. Laryngeal penetration and probable aspiration of thin observed due to decreased timing of laryngeal closure and poor epiglottic deflection.  Challenging view due to pt moving frequently during testing but coughing was worse with thin liquid and laryngeal penetration, likely aspiration occured.  Observed pt to conduct reflexively double swallow with extended breath hold - causing SLP to suspect some chronic deficits.  Residuals were worse with solids/puree than liquids.  Strategies such as chin tuck, head turn right with and without chin tuck did not help.  When pt able - dry swallow helped to decrease residuals - but these were difficult for pt to conduct on cue (likely due to motor planning deficits).  As pt is weak at this time, would recommend to modify diet to full liquids * nectar thick with strict precautions.  Pt denies significant dysphagia prior to admit, but he is sensory impaired and may not have awareness.  Using live video, educated pt to finding/recommendations.  Note palliative team following pt and plan is for no PEG.  Advised pt to strengthen his "hock" and swallow to maximize airway protection and secretion clearance, pt attempted but is too weak to propel secretions at this time.  Will follow.   SLP Visit Diagnosis Dysphagia, oropharyngeal phase (R13.12) Attention and concentration deficit following -- Frontal lobe and executive function deficit following -- Impact on safety and function Moderate aspiration risk;Risk for inadequate nutrition/hydration   CHL IP TREATMENT RECOMMENDATION 09/10/2016 Treatment Recommendations Therapy as outlined in treatment plan below   Prognosis 09/10/2016 Prognosis for Safe Diet Advancement Guarded Barriers to Reach Goals Time post onset;Other (Comment) Barriers/Prognosis Comment -- CH[facility] IP DIET RECOMMENDATION 09/10/2016 SLP Diet Recommendations Nectar thick liquid Liquid Administration via Straw Medication Administration Whole meds with  puree Compensations Slow rate;Small sips/bites;Effortful swallow Postural Changes --   CH[facility] IP OTHER RECOMMENDATIONS 09/10/2016 Recommended Consults -- Oral Care Recommendations Oral care before and after PO Other Recommendations Order thickener from pharmacy;Clarify dietary restrictions;Have oral suction available   [facility] IP FOLLOW UP RECOMMENDATIONS 12/16/2015 Follow up Recommendations Skilled Nursing facility   [facility] IP FREQUENCY AND DURATION 09/10/2016 Speech Therapy Frequency (ACUTE ONLY) min 2x/week Treatment Duration 2 weeks      CHL IP ORAL PHASE 09/10/2016 Oral Phase Impaired Oral - Pudding Teaspoon -- Oral - Pudding Cup -- Oral - Honey Teaspoon -- Oral - Honey Cup -- Oral - Nectar Teaspoon Weak lingual manipulation;Decreased bolus cohesion;Reduced posterior propulsion Oral - Nectar Cup -- Oral - Nectar Straw Weak lingual manipulation;Decreased bolus cohesion;Reduced posterior propulsion Oral - Thin Teaspoon Weak lingual manipulation;Decreased bolus cohesion;Reduced posterior propulsion Oral - Thin Cup Weak lingual manipulation;Decreased bolus cohesion;Reduced posterior propulsion Oral - Thin Straw Weak lingual manipulation;Decreased bolus cohesion;Reduced posterior propulsion Oral - Puree Weak lingual manipulation;Reduced posterior propulsion Oral - Mech Soft Weak lingual manipulation;Premature spillage;Reduced posterior propulsion;Impaired mastication Oral - Regular -- Oral - Multi-Consistency -- Oral - Pill -- Oral Phase - Comment --  CH[facility] IP PHARYNGEAL PHASE 09/10/2016 Pharyngeal Phase Impaired Pharyngeal- Pudding Teaspoon -- Pharyngeal -- Pharyngeal- Pudding Cup -- Pharyngeal -- Pharyngeal- Honey Teaspoon -- Pharyngeal -- Pharyngeal- Honey Cup -- Pharyngeal -- Pharyngeal- Nectar Teaspoon Reduced pharyngeal peristalsis;Reduced epiglottic inversion;Reduced anterior laryngeal mobility;Reduced laryngeal elevation;Reduced airway/laryngeal closure;Pharyngeal residue - valleculae;Pharyngeal residue - pyriform Pharyngeal --  Pharyngeal- Nectar Cup -- Pharyngeal -- Pharyngeal- Nectar Straw Delayed swallow initiation-pyriform sinuses;Delayed swallow initiation-vallecula;Reduced laryngeal elevation;Reduced anterior laryngeal mobility;Reduced epiglottic inversion;Reduced pharyngeal peristalsis;Reduced tongue base retraction;Reduced airway/laryngeal closure;Pharyngeal residue - valleculae;Pharyngeal residue - pyriform Pharyngeal -- Pharyngeal- Thin Teaspoon Delayed swallow initiation-vallecula;Reduced pharyngeal peristalsis;Reduced epiglottic inversion;Reduced anterior laryngeal mobility;Reduced laryngeal elevation;Reduced airway/laryngeal closure;Reduced tongue base retraction;Pharyngeal residue - valleculae;Pharyngeal residue - pyriform Pharyngeal -- Pharyngeal- Thin Cup Penetration/Aspiration during swallow;Reduced pharyngeal peristalsis;Reduced epiglottic inversion;Reduced anterior laryngeal mobility;Reduced laryngeal elevation;Reduced airway/laryngeal closure;Reduced tongue base retraction;Pharyngeal residue - valleculae;Pharyngeal residue - pyriform Pharyngeal Material enters airway, CONTACTS cords and not ejected out Pharyngeal- Thin Straw Reduced pharyngeal peristalsis;Reduced epiglottic inversion;Reduced anterior laryngeal mobility;Reduced laryngeal elevation;Reduced airway/laryngeal closure;Reduced tongue base retraction;Pharyngeal residue - valleculae;Pharyngeal residue - pyriform;Penetration/Aspiration during swallow Pharyngeal Material enters airway, CONTACTS cords and not ejected out Pharyngeal- Puree Reduced pharyngeal peristalsis;Reduced epiglottic inversion;Reduced anterior laryngeal mobility;Reduced laryngeal elevation;Reduced airway/laryngeal closure;Reduced tongue base retraction;Pharyngeal residue - valleculae Pharyngeal -- Pharyngeal- Mechanical Soft Reduced pharyngeal peristalsis;Reduced epiglottic inversion;Reduced anterior laryngeal mobility;Reduced laryngeal elevation;Reduced airway/laryngeal closure;Reduced tongue  base retraction;Pharyngeal residue - valleculae Pharyngeal -- Pharyngeal- Regular -- Pharyngeal -- Pharyngeal- Multi-consistency -- Pharyngeal -- Pharyngeal- Pill -- Pharyngeal -- Pharyngeal Comment --  CHL IP CERVICAL ESOPHAGEAL PHASE 09/10/2016 Cervical Esophageal Phase WFL- clear upon esophageal sweep x1 Pudding Teaspoon -- Pudding Cup -- Honey Teaspoon -- Honey Cup -- Nectar Teaspoon -- Nectar Cup -- Nectar Straw -- Thin Teaspoon -- Thin Cup -- Thin Straw -- Puree -- Mechanical Soft -- Regular -- Multi-consistency -- Pill -- Cervical Esophageal Comment -- No flowsheet data found. Donavan Burnetamara Kimball, MS [facility] [phone]               Time Spent in minutes  30   Susa RaringPrashant Singh M.D on 09/11/2016 at 10:00 AM  Between 7am to 7pm - Pager - [phone] ( page via [email or web address], text pages only, please mention full 10 digit call back number). After 7pm go to [email or web address] - password [email or web address]

## 2016-09-12 DIAGNOSIS — R0603 Acute respiratory distress: Secondary | ICD-10-CM

## 2016-09-13 LAB — CULTURE, BLOOD (ROUTINE X 2): Special Requests: ADEQUATE

## 2016-09-15 LAB — CULTURE, BLOOD (ROUTINE X 2)
Culture: NO GROWTH
Special Requests: ADEQUATE

## 2016-10-10 NOTE — Progress Notes (Signed)
Chaplain received referral from unit.  [place]  Wishes [place].    Spoke with brother via phone [phone]).  Provided brother with resources.  Brother had made some calls prior to chaplain contact and is choosing [facility] for cremation services.     Brother wishes to speak with palliative care, as he has some questions around Neil Lambert's death and circumstances that proceeded. He attempted to contact palliative and reports that the number went to a general hospital reception.  Will inform palliative care that pt's brother wishes to speak with them.       [facility] Chaplain Burnis KingfisherMatthew Taylee Gunn[place], [place]  Office [phone]  24h Page [phone]

## 2016-10-10 NOTE — Death Summary Note (Signed)
Death Summary  Neil Lambert ZOX:[id number] DOB: 1945-06-12 DOA: 2016/04/08  PCP: Neil MatinPaterson, Daniel, MD  Admit date: 2016/04/08 Date of Death: 09/19/2016 Time of Death:  Notification: Neil MatinPaterson, Daniel, MD notified of death of 10/01/2016   History of present illness:  Neil Lambert is a 71 y.o. male with a history of CVA with right-sided hemiparesis, chronic hepatitis C, dyslipidemia and depression Neil Lambert presented with complaint of fever and chills.  Neil Lambert did not improve after conservative medical supportive care. Patient was placed on comfort measures.  71 year old male, presents with fevers and chills. Patient is known to have CVA with right-sided hemiparesis, chronic hepatitis C, dyslipidemia and depression. Presents with nonproductive cough and subjective fevers for last 3-4 days prior to hospitalization. On initial physical examination, he was found hypoxic with oxygen saturation 87% on room air, febrile with a temperature 103.3, heart rate 101, respiratory rate 31. His mucous membranes were moist, his lungs had diminished breath sounds bilaterally, no accessory muscle use, heart S1-S2 present and rhythmic, his abdomen was soft nontender, lower extremities with no edema. Positive right-sided hemiparesis. Lactic acid was 4.6, white cell count 10, chest x-ray showed right midlung infiltrate. Patient was admitted with community acquired pneumonia, complicated with sepsis. Pneumonia suspected to be related to aspiration, speech evaluation recommended dysphagia 3 diet, suspected recurrent aspiration, with significant hypoxemia, palliative care team consulted and patient decided to be comfortable as he reaches the end of his life, he was placed on comfort measures. IV morphine, comfort and respiratory distress management, patient was allowed to have natural death per his request   Final Diagnoses:  1.   Acute hypoxic respiratory failure 2.   Aspiration pneumonia 3.    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   Sepsis due to aspiration pneumonia 5.    Hepatitis C infection/ chronic 6.    Anemia, multifactorial   The results of significant diagnostics from this hospitalization (including imaging, microbiology, ancillary and laboratory) are listed below for reference.    Significant Diagnostic Studies: Dg Chest 2 View  Result Date: 09/08/2016 CLINICAL DATA:  SOB and cough today; pt right arm was paralyzed, unable to raise up for better lateral view EXAM: CHEST  2 VIEW COMPARISON:  09/07/2016 FINDINGS: Mild improvement in the irregular interstitial and intervening hazy airspace opacity noted in the right upper lobe inferiorly, bordering the minor fissure, and both lung bases, greater on the right. No new lung abnormalities. There are small bilateral pleural effusions.  No pneumothorax. Cardiac silhouette is normal in size. No mediastinal or hilar masses or convincing adenopathy. IMPRESSION: 1. Mild improvement in lung aeration from the previous day's exam. Some of this apparent change may be technical only, findings to suggest mildly improved multifocal pneumonia. 2. Small bilateral pleural effusions. No convincing pulmonary edema. Electronically Signed   By: Amie Portlandavid  Ormond M.D.   On: 09/08/2016 09:00   Dg Chest Port 1 View  Result Date: 09/09/2016 CLINICAL DATA:  Shortness of breath. EXAM: PORTABLE CHEST 1 VIEW COMPARISON:  09/08/2016 FINDINGS: The cardiac silhouette, mediastinal and hilar contours are stable. Persistent bilateral infiltrates and underlying emphysema. No pleural effusions or pneumothorax. IMPRESSION: Persistent bilateral infiltrates. Electronically Signed   By: Rudie MeyerP.  Gallerani M.D.   On: 09/09/2016 10:02   Dg Chest Port 1 View  Result Date: 09/08/2016 CLINICAL DATA:  Respiratory distress EXAM: PORTABLE CHEST 1 VIEW COMPARISON:  09/08/2016 FINDINGS: Patchy airspace disease again noted throughout the right lung and left base. This  is most pronounced in the right upper lobe with some improvement in both lung bases. Mild cardiomegaly. No effusions or acute bony abnormality. IMPRESSION: Patchy bilateral multifocal airspace opacities, most confluent in the right upper lobe with some improvement in lung bases. Findings most compatible with multifocal pneumonia. Electronically Signed   By: Charlett Nose M.D.   On: 09/08/2016 23:33   Dg Chest Port 1 View  Result Date: 09/07/2016 CLINICAL DATA:  Shortness of breath. EXAM: PORTABLE CHEST 1 VIEW COMPARISON:  08/24/2016. FINDINGS: Heart size is stable. Right mid and lower lung progressive infiltrate. Mild infiltrate left lung base noted on today's exam. No prominent pleural effusion. No pneumothorax. IMPRESSION: 1. Right mid and lower lung progressive infiltrate consistent with pneumonia. 2. Mild infiltrate noted in the left lung base on today's exam. Electronically Signed   By: Maisie Fus  Register   On: 09/07/2016 06:20   Dg Chest Portable 1 View  Result Date: 08/19/2016 CLINICAL DATA:  Altered mental status. Fever or shortness of breath. EXAM: PORTABLE CHEST 1 VIEW COMPARISON:  03/22/2016 ; 07/12/2008; chest CT -07/14/2008 FINDINGS: Grossly unchanged cardiac silhouette and mediastinal contours given decreased lung volumes and patient rotation. Thickening of the right paratracheal stripe is favored to be secondary to prominent vasculature. Potential developing airspace opacity within the right mid lung. No pleural effusion. No evidence of edema. Skin fold overlies the right lung apex. No pneumothorax. No acute osseus abnormalities. IMPRESSION: Findings worrisome for developing right mid lung pneumonia on this supine portable examination. Further evaluation with a PA and lateral chest radiograph may be obtained as clinically indicated. Electronically Signed   By: Simonne Come M.D.   On: 08/27/2016 17:58   Dg Swallowing Func-speech Pathology  Result Date: 09/10/2016 Objective Swallowing Evaluation:  Type of Study: MBS-Modified Barium Swallow Study Patient Details Name: Neil Lambert MRN: [id number] Date of Birth: 01/18/1946 Today's Date: 09/10/2016 Time: SLP Start Time (ACUTE ONLY): 1259-SLP Stop Time (ACUTE ONLY): 1328 SLP Time Calculation (min) (ACUTE ONLY): 29 min Past Medical History: Past Medical History: Diagnosis Date . Depression  . HCV (hepatitis C virus)  . Headache  . Hyperlipidemia  . Stroke [facility])  Past Surgical History: Past Surgical History: Procedure Laterality Date . CHOLECYSTECTOMY   . FOOT SURGERY    with metal brace in but  not any more   worn  brace on rt foot for 6 yrs HPI: 71 yo male adm to [facility] with fever, AMS, Pt with ? right upper middle lobe infiltrates.  CXR 09/09/16 showed persistent bilateral infiltrates.  Pt also with PMH + for prior CVA with right HP, chronic pain, Hep C, hematuria, pain medication usage ETOH and prior smoker.  Swallow evaluation ordered.  Pt denies significant problems with swallowing, states he is near baseline.  MBS ordered due to concern for recurrent aspiration.  Per RN, concern is present for pt to recurrently aspirate when taking pain medication and becoming lethargic.    Subjective: pt awake in chair Assessment / Plan / Recommendation CHL IP CLINICAL IMPRESSIONS 09/10/2016 Clinical Impression Pt presents with moderate oropharyngeal sensorimotor based dysphagia likely due to his late effects of CVA with exacerbation d/t deconditioning.   Delayed oral transiting and decreased lingual control noted with pt requiring 7 seconds to masticate small moistened cracker bolus.  Poor tongue base retraction and impaired epiglottic deflection allowed post=swallow residuals with no awareness.  Pt was overtly coughing on a few occasions during MBS - suspect barium mixed with oropharyngeal secretions and were aspirated. Laryngeal penetration  and probable aspiration of thin observed due to decreased timing of laryngeal closure and poor epiglottic deflection.  Challenging view due  to pt moving frequently during testing but coughing was worse with thin liquid and laryngeal penetration, likely aspiration occured.  Observed pt to conduct reflexively double swallow with extended breath hold - causing SLP to suspect some chronic deficits.  Residuals were worse with solids/puree than liquids.  Strategies such as chin tuck, head turn right with and without chin tuck did not help.  When pt able - dry swallow helped to decrease residuals - but these were difficult for pt to conduct on cue (likely due to motor planning deficits).  As pt is weak at this time, would recommend to modify diet to full liquids * nectar thick with strict precautions.  Pt denies significant dysphagia prior to admit, but he is sensory impaired and may not have awareness.  Using live video, educated pt to finding/recommendations.  Note palliative team following pt and plan is for no PEG.  Advised pt to strengthen his "hock" and swallow to maximize airway protection and secretion clearance, pt attempted but is too weak to propel secretions at this time.  Will follow.   SLP Visit Diagnosis Dysphagia, oropharyngeal phase (R13.12) Attention and concentration deficit following -- Frontal lobe and executive function deficit following -- Impact on safety and function Moderate aspiration risk;Risk for inadequate nutrition/hydration   CHL IP TREATMENT RECOMMENDATION 09/10/2016 Treatment Recommendations Therapy as outlined in treatment plan below   Prognosis 09/10/2016 Prognosis for Safe Diet Advancement Guarded Barriers to Reach Goals Time post onset;Other (Comment) Barriers/Prognosis Comment -- CHL IP DIET RECOMMENDATION 09/10/2016 SLP Diet Recommendations Nectar thick liquid Liquid Administration via Straw Medication Administration Whole meds with puree Compensations Slow rate;Small sips/bites;Effortful swallow Postural Changes --   CHL IP OTHER RECOMMENDATIONS 09/10/2016 Recommended Consults -- Oral Care Recommendations Oral care before and  after PO Other Recommendations Order thickener from pharmacy;Clarify dietary restrictions;Have oral suction available   CHL IP FOLLOW UP RECOMMENDATIONS 12/16/2015 Follow up Recommendations Skilled Nursing facility   [facility] IP FREQUENCY AND DURATION 09/10/2016 Speech Therapy Frequency (ACUTE ONLY) min 2x/week Treatment Duration 2 weeks      CHL IP ORAL PHASE 09/10/2016 Oral Phase Impaired Oral - Pudding Teaspoon -- Oral - Pudding Cup -- Oral - Honey Teaspoon -- Oral - Honey Cup -- Oral - Nectar Teaspoon Weak lingual manipulation;Decreased bolus cohesion;Reduced posterior propulsion Oral - Nectar Cup -- Oral - Nectar Straw Weak lingual manipulation;Decreased bolus cohesion;Reduced posterior propulsion Oral - Thin Teaspoon Weak lingual manipulation;Decreased bolus cohesion;Reduced posterior propulsion Oral - Thin Cup Weak lingual manipulation;Decreased bolus cohesion;Reduced posterior propulsion Oral - Thin Straw Weak lingual manipulation;Decreased bolus cohesion;Reduced posterior propulsion Oral - Puree Weak lingual manipulation;Reduced posterior propulsion Oral - Mech Soft Weak lingual manipulation;Premature spillage;Reduced posterior propulsion;Impaired mastication Oral - Regular -- Oral - Multi-Consistency -- Oral - Pill -- Oral Phase - Comment --  CHL IP PHARYNGEAL PHASE 09/10/2016 Pharyngeal Phase Impaired Pharyngeal- Pudding Teaspoon -- Pharyngeal -- Pharyngeal- Pudding Cup -- Pharyngeal -- Pharyngeal- Honey Teaspoon -- Pharyngeal -- Pharyngeal- Honey Cup -- Pharyngeal -- Pharyngeal- Nectar Teaspoon Reduced pharyngeal peristalsis;Reduced epiglottic inversion;Reduced anterior laryngeal mobility;Reduced laryngeal elevation;Reduced airway/laryngeal closure;Pharyngeal residue - valleculae;Pharyngeal residue - pyriform Pharyngeal -- Pharyngeal- Nectar Cup -- Pharyngeal -- Pharyngeal- Nectar Straw Delayed swallow initiation-pyriform sinuses;Delayed swallow initiation-vallecula;Reduced laryngeal elevation;Reduced anterior  laryngeal mobility;Reduced epiglottic inversion;Reduced pharyngeal peristalsis;Reduced tongue base retraction;Reduced airway/laryngeal closure;Pharyngeal residue - valleculae;Pharyngeal residue - pyriform Pharyngeal -- Pharyngeal- Thin Teaspoon Delayed swallow initiation-vallecula;Reduced pharyngeal peristalsis;Reduced  epiglottic inversion;Reduced anterior laryngeal mobility;Reduced laryngeal elevation;Reduced airway/laryngeal closure;Reduced tongue base retraction;Pharyngeal residue - valleculae;Pharyngeal residue - pyriform Pharyngeal -- Pharyngeal- Thin Cup Penetration/Aspiration during swallow;Reduced pharyngeal peristalsis;Reduced epiglottic inversion;Reduced anterior laryngeal mobility;Reduced laryngeal elevation;Reduced airway/laryngeal closure;Reduced tongue base retraction;Pharyngeal residue - valleculae;Pharyngeal residue - pyriform Pharyngeal Material enters airway, CONTACTS cords and not ejected out Pharyngeal- Thin Straw Reduced pharyngeal peristalsis;Reduced epiglottic inversion;Reduced anterior laryngeal mobility;Reduced laryngeal elevation;Reduced airway/laryngeal closure;Reduced tongue base retraction;Pharyngeal residue - valleculae;Pharyngeal residue - pyriform;Penetration/Aspiration during swallow Pharyngeal Material enters airway, CONTACTS cords and not ejected out Pharyngeal- Puree Reduced pharyngeal peristalsis;Reduced epiglottic inversion;Reduced anterior laryngeal mobility;Reduced laryngeal elevation;Reduced airway/laryngeal closure;Reduced tongue base retraction;Pharyngeal residue - valleculae Pharyngeal -- Pharyngeal- Mechanical Soft Reduced pharyngeal peristalsis;Reduced epiglottic inversion;Reduced anterior laryngeal mobility;Reduced laryngeal elevation;Reduced airway/laryngeal closure;Reduced tongue base retraction;Pharyngeal residue - valleculae Pharyngeal -- Pharyngeal- Regular -- Pharyngeal -- Pharyngeal- Multi-consistency -- Pharyngeal -- Pharyngeal- Pill -- Pharyngeal -- Pharyngeal  Comment --  CHL IP CERVICAL ESOPHAGEAL PHASE 09/10/2016 Cervical Esophageal Phase WFL- clear upon esophageal sweep x1 Pudding Teaspoon -- Pudding Cup -- Honey Teaspoon -- Honey Cup -- Nectar Teaspoon -- Nectar Cup -- Nectar Straw -- Thin Teaspoon -- Thin Cup -- Thin Straw -- Puree -- Mechanical Soft -- Regular -- Multi-consistency -- Pill -- Cervical Esophageal Comment -- No flowsheet data found. Donavan Burnet, MS [facility] SLP [phone]               Microbiology: Recent Results (from the past 240 hour(s))  Blood Culture (routine x 2)     Status: None   Collection Time: 08/27/2016 11:48 PM  Result Value Ref Range Status   Specimen Description BLOOD LEFT FOREARM  Final   Special Requests IN PEDIATRIC BOTTLE Blood Culture adequate volume  Final   Culture   Final    NO GROWTH 5 DAYS Performed at [facility], [address] [place], [place] [address]    Report Status 09/11/2016 FINAL  Final  Blood Culture (routine x 2)     Status: None   Collection Time: 08/20/2016 11:48 PM  Result Value Ref Range Status   Specimen Description BLOOD RIGHT FOREARM  Final   Special Requests   Final    BOTTLES DRAWN AEROBIC AND ANAEROBIC Blood Culture adequate volume   Culture   Final    NO GROWTH 5 DAYS Performed at [facility], [address] [place], [place] [address]    Report Status 09/11/2016 FINAL  Final  MRSA PCR Screening     Status: None   Collection Time: 08/16/2016 11:55 PM  Result Value Ref Range Status   MRSA by PCR NEGATIVE NEGATIVE Final    Comment:        The GeneXpert MRSA Assay (FDA approved for NASAL specimens only), is one component of a comprehensive MRSA colonization surveillance program. It is not intended to diagnose MRSA infection nor to guide or monitor treatment for MRSA infections.   Culture, Urine     Status: None   Collection Time: 09/06/16 12:25 AM  Result Value Ref Range Status   Specimen Description URINE, CLEAN CATCH  Final   Special Requests NONE  Final    Culture   Final    NO GROWTH Performed at [facility], [address] [place], [place] [address]    Report Status 09/07/2016 FINAL  Final  Culture, blood (routine x 2)     Status: None (Preliminary result)   Collection Time: 09/10/16  6:35 AM  Result Value Ref Range Status  Specimen Description BLOOD LEFT HAND  Final   Special Requests   Final    BOTTLES DRAWN AEROBIC ONLY Blood Culture adequate volume   Culture   Final    NO GROWTH 2 DAYS Performed at [facility], [address], [place], [place] [address]    Report Status PENDING  Incomplete  Culture, blood (routine x 2)     Status: Abnormal (Preliminary result)   Collection Time: 09/10/16  6:35 AM  Result Value Ref Range Status   Specimen Description BLOOD LEFT HAND  Final   Special Requests   Final    BOTTLES DRAWN AEROBIC ONLY Blood Culture adequate volume   Culture  Setup Time   Final    GRAM POSITIVE COCCI IN CLUSTERS AEROBIC BOTTLE ONLY CRITICAL RESULT CALLED TO, READ BACK BY AND VERIFIED WITH: T. GREEN, RPHARMD ([facility]) AT 0905 ON 09/11/16 BY C. JESSUP, MLT. Performed at [facility], [address], [place], [place] [address]    Culture STAPHYLOCOCCUS SPECIES (COAGULASE NEGATIVE) (A)  Final   Report Status PENDING  Incomplete  Blood Culture ID Panel (Reflexed)     Status: Abnormal   Collection Time: 09/10/16  6:35 AM  Result Value Ref Range Status   Enterococcus species NOT DETECTED NOT DETECTED Final   Listeria monocytogenes NOT DETECTED NOT DETECTED Final   Staphylococcus species DETECTED (A) NOT DETECTED Final    Comment: Methicillin (oxacillin) susceptible coagulase negative staphylococcus. Possible blood culture contaminant (unless isolated from more than one blood culture draw or clinical case suggests pathogenicity). No antibiotic treatment is indicated for blood  culture contaminants. CRITICAL RESULT CALLED TO, READ BACK BY AND VERIFIED WITH: T. GREEN, RPHARMD ([facility]) AT 0905 ON 09/11/16 BY C.  JESSUP, MLT.    Staphylococcus aureus NOT DETECTED NOT DETECTED Final   Methicillin resistance NOT DETECTED NOT DETECTED Final   Streptococcus species NOT DETECTED NOT DETECTED Final   Streptococcus agalactiae NOT DETECTED NOT DETECTED Final   Streptococcus pneumoniae NOT DETECTED NOT DETECTED Final   Streptococcus pyogenes NOT DETECTED NOT DETECTED Final   Acinetobacter baumannii NOT DETECTED NOT DETECTED Final   Enterobacteriaceae species NOT DETECTED NOT DETECTED Final   Enterobacter cloacae complex NOT DETECTED NOT DETECTED Final   Escherichia coli NOT DETECTED NOT DETECTED Final   Klebsiella oxytoca NOT DETECTED NOT DETECTED Final   Klebsiella pneumoniae NOT DETECTED NOT DETECTED Final   Proteus species NOT DETECTED NOT DETECTED Final   Serratia marcescens NOT DETECTED NOT DETECTED Final   Haemophilus influenzae NOT DETECTED NOT DETECTED Final   Neisseria meningitidis NOT DETECTED NOT DETECTED Final   Pseudomonas aeruginosa NOT DETECTED NOT DETECTED Final   Candida albicans NOT DETECTED NOT DETECTED Final   Candida glabrata NOT DETECTED NOT DETECTED Final   Candida krusei NOT DETECTED NOT DETECTED Final   Candida parapsilosis NOT DETECTED NOT DETECTED Final   Candida tropicalis NOT DETECTED NOT DETECTED Final    Comment: Performed at [facility], [address], [place], [place] [address]     Labs: Basic Metabolic Panel:  Recent Labs Lab 09/06/16 [phone] 09/07/16 0321 09/08/16 0951 09/09/16 0406 09/10/16 0635 09/11/16 0346  NA 138 138 140 144  --  142  K 3.6 4.3 4.1 5.2* 4.0 3.7  CL 114* 118* 117* 115*  --  110  CO2 19* 16* 21* 23  --  24  GLUCOSE 104* 144* 128* 124*  --  126*  BUN 13 11 13 18   --  20  CREATININE 0.91 0.75 0.75 0.93  --  0.85  CALCIUM 7.0* 7.0* 7.4* 7.4*  --  7.6*  MG  --   --   --   --   --  2.1   Liver Function Tests:  Recent Labs Lab Sep 26, 2016 1805 09/06/16 0332  AST 120* 86*  ALT 52 38  ALKPHOS 114 71  BILITOT 1.6* 1.6*  PROT 6.5  4.7*  ALBUMIN 2.8* 1.9*   No results for input(s): LIPASE, AMYLASE in the last 168 hours.  Recent Labs Lab 09/26/16 1805  AMMONIA 16   CBC:  Recent Labs Lab 09/26/2016 1805  09/07/16 0321 09/08/16 0951 09/09/16 0406 09/10/16 0635 09/11/16 0346  WBC 10.1  < > 18.9* 23.7* 20.3* 14.4* 13.8*  NEUTROABS 7.6  --   --   --   --   --   --   HGB 10.6*  < > 9.1* 9.6* 9.3* 8.9* 9.2*  HCT 33.3*  < > 27.8* 29.9* 29.3* 27.6* 29.1*  MCV 76.4*  < > 74.9* 76.5* 75.7* 74.8* 74.8*  PLT 215  < > 110* 167 128* 172 170  < > = values in this interval not displayed. Cardiac Enzymes: No results for input(s): CKTOTAL, CKMB, CKMBINDEX, TROPONINI in the last 168 hours. D-Dimer No results for input(s): DDIMER in the last 72 hours. BNP: Invalid input(s): POCBNP CBG:  Recent Labs Lab 09/06/16 0036  GLUCAP 94   Anemia work up No results for input(s): VITAMINB12, FOLATE, FERRITIN, TIBC, IRON, RETICCTPCT in the last 72 hours. Urinalysis    Component Value Date/Time   COLORURINE YELLOW 09/06/2016 0025   APPEARANCEUR CLEAR 09/06/2016 0025   LABSPEC 1.026 09/06/2016 0025   PHURINE 5.0 09/06/2016 0025   GLUCOSEU NEGATIVE 09/06/2016 0025   HGBUR SMALL (A) 09/06/2016 0025   BILIRUBINUR NEGATIVE 09/06/2016 0025   KETONESUR 5 (A) 09/06/2016 0025   PROTEINUR NEGATIVE 09/06/2016 0025   NITRITE NEGATIVE 09/06/2016 0025   LEUKOCYTESUR SMALL (A) 09/06/2016 0025   Sepsis Labs Invalid input(s): PROCALCITONIN,  WBC,  LACTICIDVEN     SIGNED:  Coralie Keens, MD  [facility] 09/21/2016, 5:04 PM Pager   If 7PM-7AM, please contact night-coverage [email or web address] Password [email or web address]

## 2016-10-10 NOTE — Progress Notes (Signed)
Patient has absent heart and breath sounds. Absent peripheral pulses. Pupils are fixed and dilated. Patient pronounced dead at 10:05. Second verification by nurse Marijo SanesKara Brooks RN. Dr. Ella JubileeArrien and Dr. Neale BurlyFreeman notified. Charge Nurse, AC, and Bed placement notified. Left brother Reginia NaasSteve Beaudoin voicemail with contact number. No answer at this time.

## 2016-10-10 NOTE — Progress Notes (Signed)
Daily Progress Note   Patient Name: Neil Lambert       Date: 10/09/2016 DOB: April 13, 1945  Age: 71 y.o. MRN#: [id number] Attending Physician: Coralie KeensArrien, Mauricio Daniel,* Primary Care Physician: Jarome MatinPaterson, Daniel, MD Admit Date: 08/15/2016  Reason for Consultation/Follow-up: Establishing goals of care  Subjective: Mr. Neil Lambert is actively dying.  He appears comfortable on exam this AM.  Length of Stay: 7  Current Medications: Scheduled Meds:  . gabapentin  300 mg Oral TID  . lidocaine  1 patch Transdermal Daily  . polyethylene glycol  17 g Oral Daily  . polyvinyl alcohol  1 drop Both Eyes BID  . QUEtiapine  12.5 mg Oral QHS    Continuous Infusions: . morphine 2 mg/hr (09/11/16 1851)    PRN Meds: acetaminophen, albuterol, glycopyrrolate **OR** glycopyrrolate **OR** glycopyrrolate, haloperidol lactate, ibuprofen, ipratropium-albuterol, LORazepam **OR** LORazepam **OR** LORazepam, morphine  Physical Exam     General: Somnolent. In no acute distress.  HEENT: No bruits, no goiter, no JVD Heart: Tachycardic. No murmur appreciated. Lungs: Fair air movement, regular.  Scattered coarse Abdomen: Soft, nontender, nondistended, positive bowel sounds.  Ext: No significant edema Skin: Warm and dry  Vital Signs: BP 140/83 (BP Location: Right Arm)   Pulse (!) 102   Temp 98.5 F (36.9 C) (Axillary)   Resp (!) 22   Ht 5\' 9"  (1.753 m)   Wt 75.3 kg (166 lb 0.1 oz)   SpO2 (!) 89%   BMI 24.51 kg/m  SpO2: SpO2: (!) 89 % O2 Device: O2 Device: Nasal Cannula O2 Flow Rate: O2 Flow Rate (L/min): 6 L/min  Intake/output summary:  Intake/Output Summary (Last 24 hours) at 01-01-17 [id number] Last data filed at 01-01-17 0453  Gross per 24 hour  Intake             6.93 ml  Output              900 ml    Net          -893.07 ml   LBM: Last BM Date: 09/11/16 Baseline Weight: Weight: 70.3 kg (155 lb) Most recent weight: Weight: 75.3 kg (166 lb 0.1 oz)       Palliative Assessment/Data:    Flowsheet Rows     Most Recent Value  Intake Tab  Referral Department  Hospitalist  Unit at  Time of Referral  Med/Surg Unit  Palliative Care Primary Diagnosis  Pulmonary  Date Notified  09/09/16  Palliative Care Type  New Palliative care  Reason for referral  Clarify Goals of Care  Date of Admission  08/29/2016  Date first seen by Palliative Care  09/10/16  # of days Palliative referral response time  1 Day(s)  # of days IP prior to Palliative referral  4  Clinical Assessment  Palliative Performance Scale Score  30%  Pain Max last 24 hours  5  Pain Min Last 24 hours  4  Dyspnea Max Last 24 Hours  5  Dyspnea Min Last 24 hours  4  Psychosocial & Spiritual Assessment  Palliative Care Outcomes  Patient/Family meeting held?  Yes  Who was at the meeting?  patient   Palliative Care Outcomes  Clarified goals of care      Patient Active Problem List   Diagnosis Date Noted  . Encounter for palliative care   . Goals of care, counseling/discussion   . Sepsis (HCC) 08/17/2016  . CAP (community acquired pneumonia) 09/06/2016  . Dyslipidemia 07/14/2016  . MDD (major depressive disorder), recurrent, severe, with psychosis (HCC) 05/03/2016  . Hypertension, benign 04/24/2016  . Chronic pain syndrome 03/27/2016  . Cervical spondylosis 03/27/2016  . Adjustment reaction with anxiety and depression 03/27/2016  . Avascular necrosis of hip, left (HCC) 01/29/2016  . Weakness of extremity   . Gait instability   . Depression 12/14/2015  . History of CVA (cerebrovascular accident) 12/14/2015  . Right hemiplegia (HCC)   . Right hip pain   . Generalized weakness 12/13/2015  . Hepatitis C, chronic (HCC) 12/06/2010    Palliative Care Assessment & Plan  71 year old male with prior CVA with sepsis, now full  comfort care  Recommendations/Plan:  Pain/ SOB: Neil Lambert appears comfortable on exam this AM.  He does not arouse to verbal or tactile stimulation.  Currently on 2mg /hr infusion and will plan to continue same basal rate with continued PRN as needed.  Currently on 5L [place].  Will work to titrate this down while increasing comfort meds as needed.  Continue current care.  Goals of Care and Additional Recommendations:  Limitations on Scope of Treatment: Full Comfort Care  Code Status:    Code Status Orders        Start     Ordered   09/11/16 1102  Do not attempt resuscitation (DNR)  Continuous    Question Answer Comment  In the event of cardiac or respiratory ARREST Do not call a "code blue"   In the event of cardiac or respiratory ARREST Do not perform Intubation, CPR, defibrillation or ACLS   In the event of cardiac or respiratory ARREST Use medication by any route, position, wound care, and other measures to relive pain and suffering. May use oxygen, suction and manual treatment of airway obstruction as needed for comfort.      09/11/16 1103    Code Status History    Date Active Date Inactive Code Status Order ID Comments User Context   08/20/2016 11:13 PM 09/11/2016 11:03 AM DNR [id number]  Michael Litter, MD ED   12/13/2015 11:18 PM 12/16/2015  9:48 PM DNR [id number]  Freddrick March, MD Inpatient    Advance Directive Documentation     Most Recent Value  Type of Advance Directive  Out of facility DNR (pink MOST or yellow form)  Pre-existing out of facility DNR order (yellow form or pink MOST form)  Yellow  form placed in chart (order not valid for inpatient use)  "MOST" Form in Place?  -       Prognosis:   Hours - Days  Discharge Planning:  Anticipated Hospital Death.   Care plan was discussed with bedside RN, Dr. Ella Jubilee  Thank you for allowing the Palliative Medicine Team to assist in the care of this patient.   Total time: 20 minutes Greater than 50%  of this time  was spent counseling and coordinating care related to the above assessment and plan.  Romie Minus, MD  Please contact Palliative Medicine Team phone at [phone] for questions and concerns.

## 2016-10-10 DEATH — deceased

## 2017-12-23 IMAGING — MR MR CERVICAL SPINE W/O CM
4 of 5 series · 19 of 48 positions shown · non-contrast
Comparison: None.

CLINICAL DATA: Frequent falls and marked weakness, particularly in
the right lower extremity.

EXAM:
MRI CERVICAL SPINE WITHOUT CONTRAST
TECHNIQUE: Multiplanar, multisequence MR imaging of the cervical spine was
performed. No intravenous contrast was administered.

[Series 3: T2 · sagittal · 3.0mm · 0.43mm/px · 6 of 14 slices shown (1 of 2)]
[im 1/14]
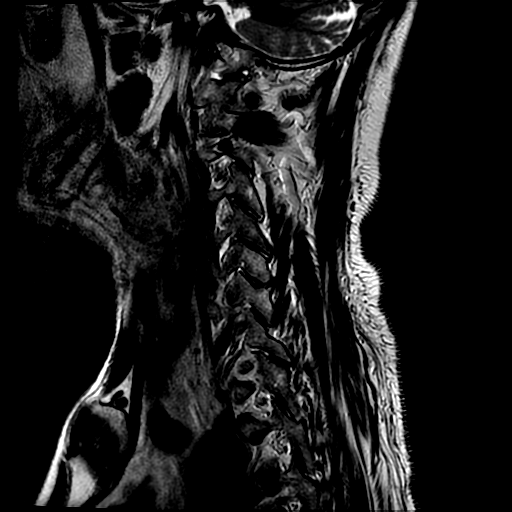
[im 3/14]
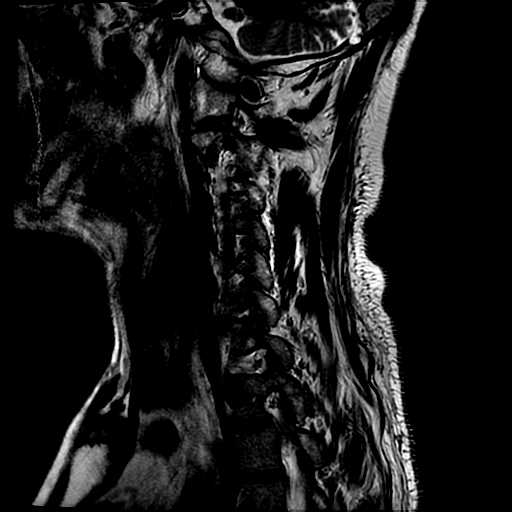
[im 6/14]
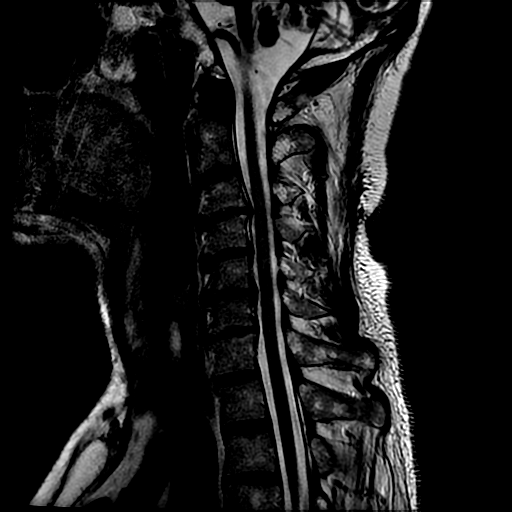
[im 8/14]
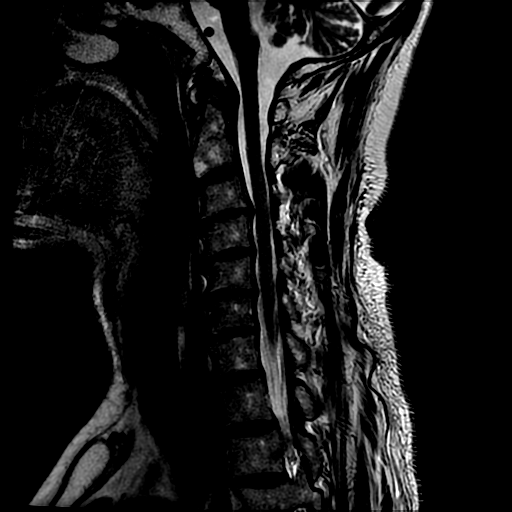
[im 11/14]
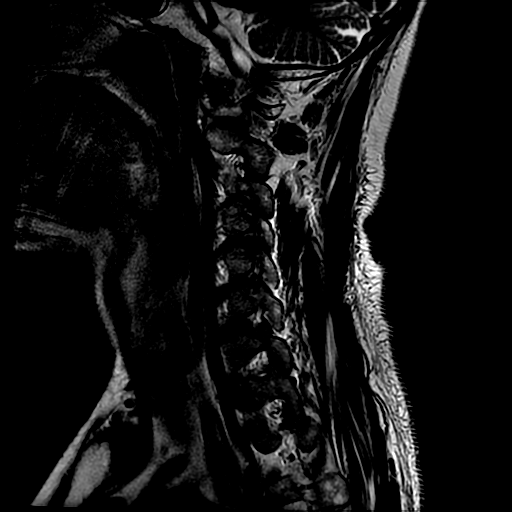
[im 14/14]
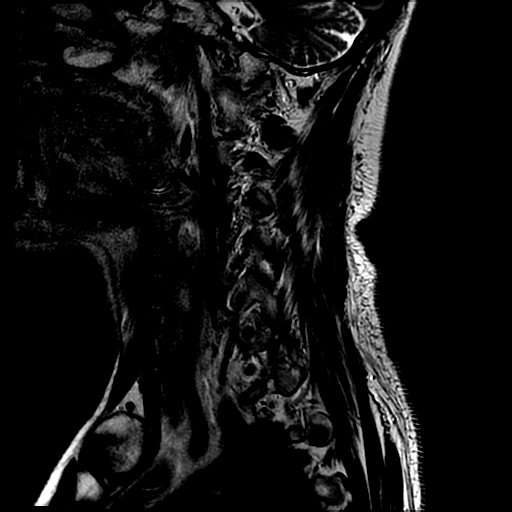

[Series 4: T1 · sagittal · 3.0mm · 0.43mm/px · 3 of 14 slices shown]
[im 3/14]
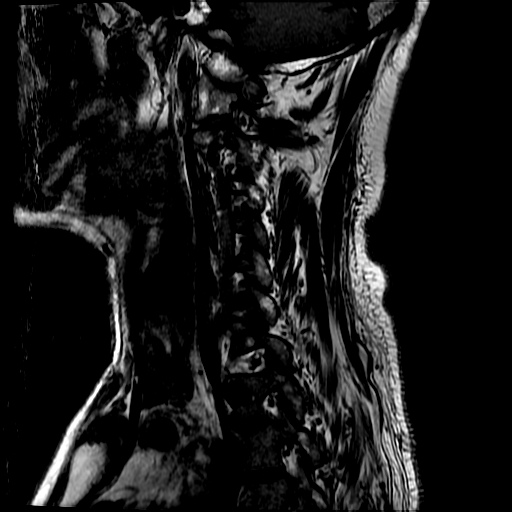
[im 8/14]
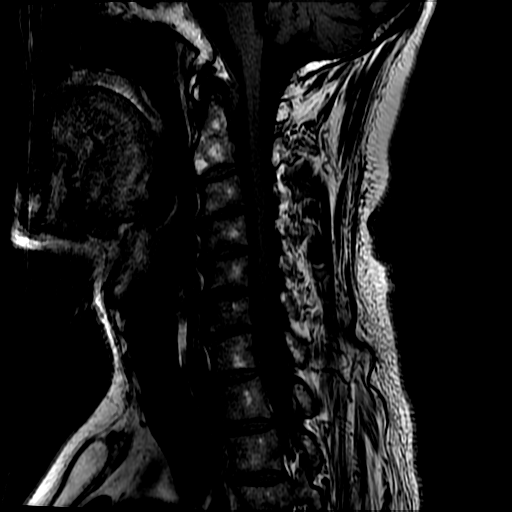
[im 14/14]
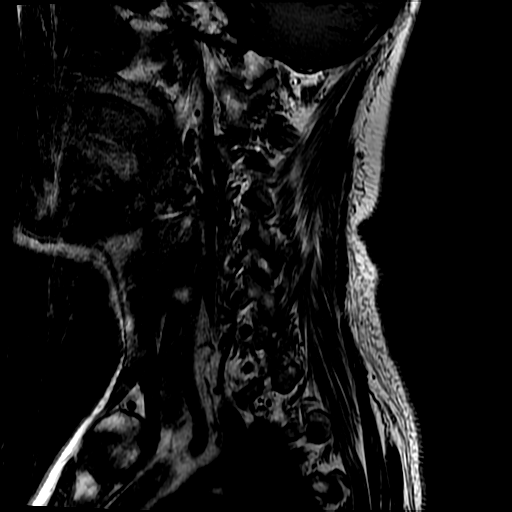

[Series 5: sag ir · sagittal · 3.0mm · 0.43mm/px · 3 of 14 slices shown]
[im 3/14]
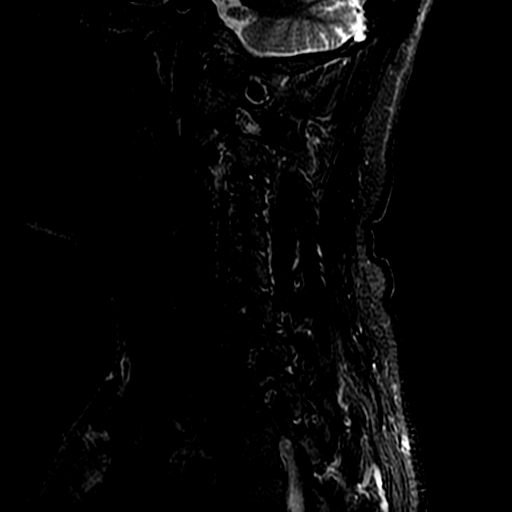
[im 8/14]
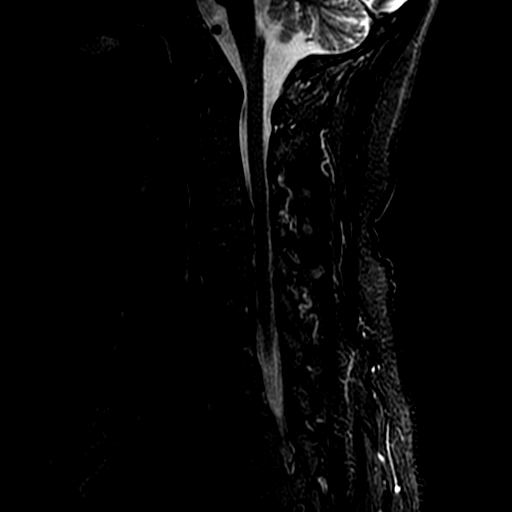
[im 14/14]
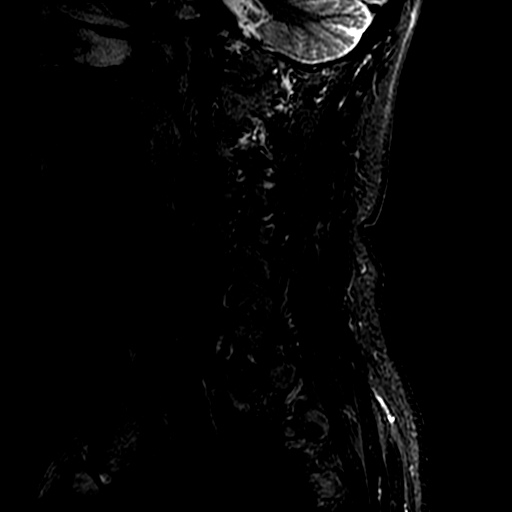

[Series 7: T2 · axial · 3.0mm · 0.39mm/px · z∈[-13,+83]mm · 7 of 36 slices shown (2 of 2)]
[im 1/36]
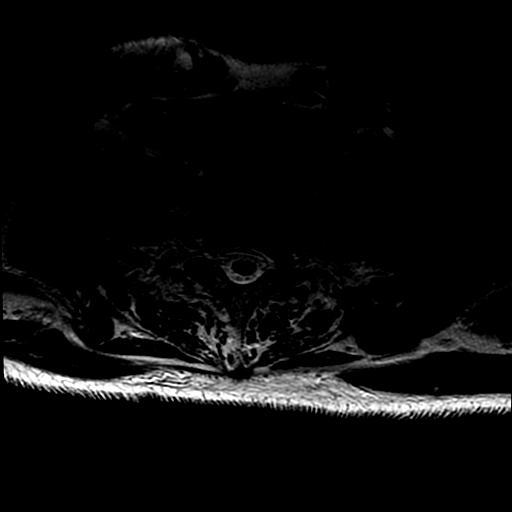
[im 6/36]
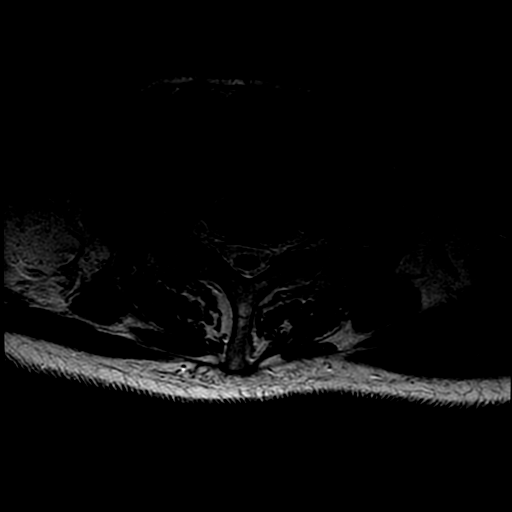
[im 11/36]
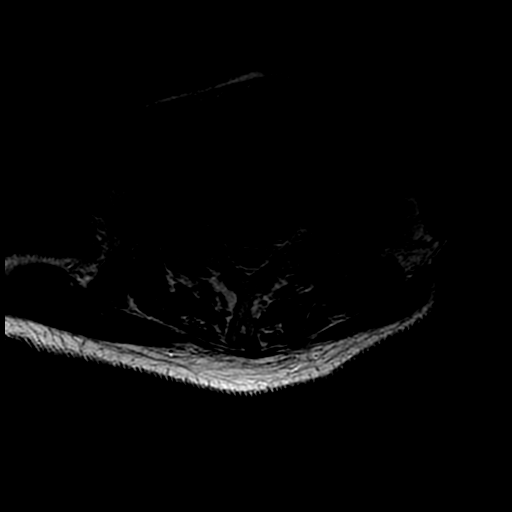
[im 16/36]
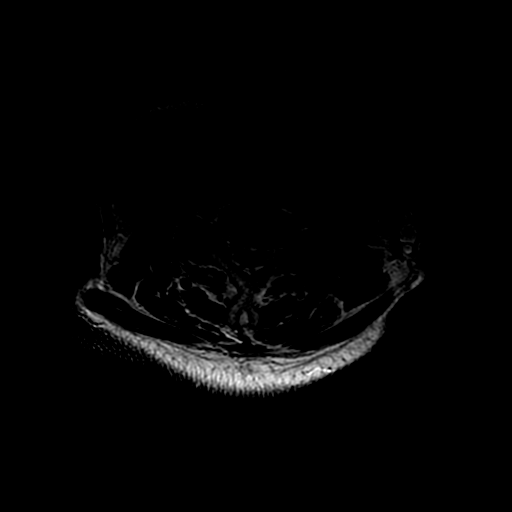
[im 18/36]
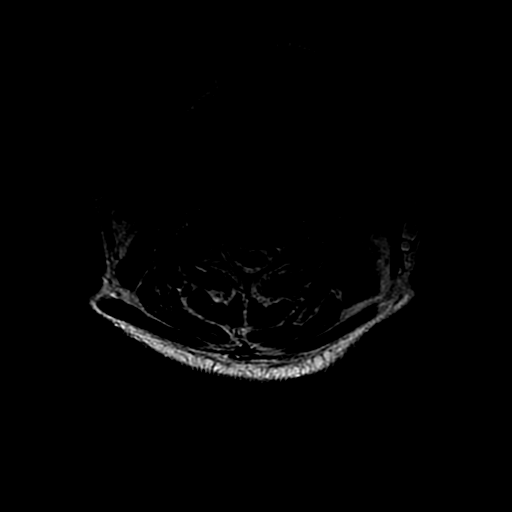
[im 21/36]
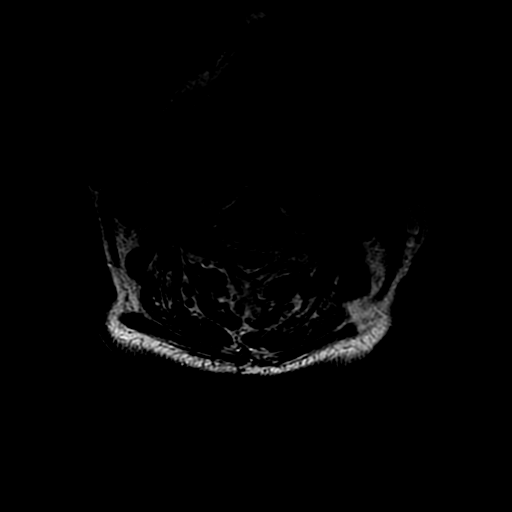
[im 31/36]
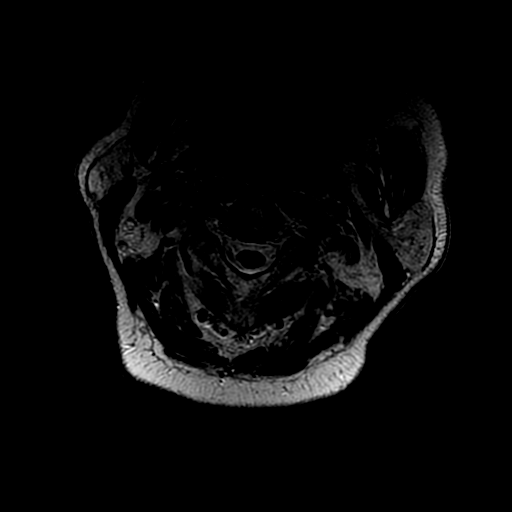

[19 of 48 positions shown; findings below may reference images not displayed]

FINDINGS: Alignment: There is mild reversal lordosis.

Vertebrae: Height is maintained. Scattered hemangiomas are seen. No
worrisome marrow lesion.

Cord: Normal signal throughout.

Posterior Fossa, vertebral arteries, paraspinal tissues:
Unremarkable.

Disc levels:

C2-3:  Negative.

C3-4: Disc osteophyte complex and bilateral uncovertebral disease
are seen. There is flattening of the ventral cord and severe
bilateral foraminal narrowing.

C4-5: Shallow disc osteophyte complex and bilateral uncovertebral
disease. There is mild flattening of the ventral cord. Moderately
severe to severe foraminal narrowing appears worse on the left.

C5-6: Shallow disc bulge with endplate spurring and uncovertebral
disease. The ventral thecal sac is nearly effaced. Moderately severe
to severe foraminal narrowing is worse on the left.

C6-7: Shallow disc bulge and uncovertebral disease are seen. The
ventral thecal sac is narrowed but not effaced. Moderately severe to
severe foraminal narrowing is worse on the right.

C7-T1:  Negative.
IMPRESSION: Normal appearing cervical cord. No finding to explain the patient's
symptoms.

Multilevel spondylosis predominantly results in foraminal narrowing
as detailed above. Central canal stenosis appears worst at C3-4.
Please see above for descriptions of individual levels.

## 2018-09-14 IMAGING — DX DG CHEST 1V PORT
1 series · 1 of 1 positions shown · non-contrast
Comparison: 03/22/2016 ; 07/12/2008; chest CT -07/14/2008

CLINICAL DATA: Altered mental status. Fever or shortness of breath.

EXAM:
PORTABLE CHEST 1 VIEW

[chest ap]
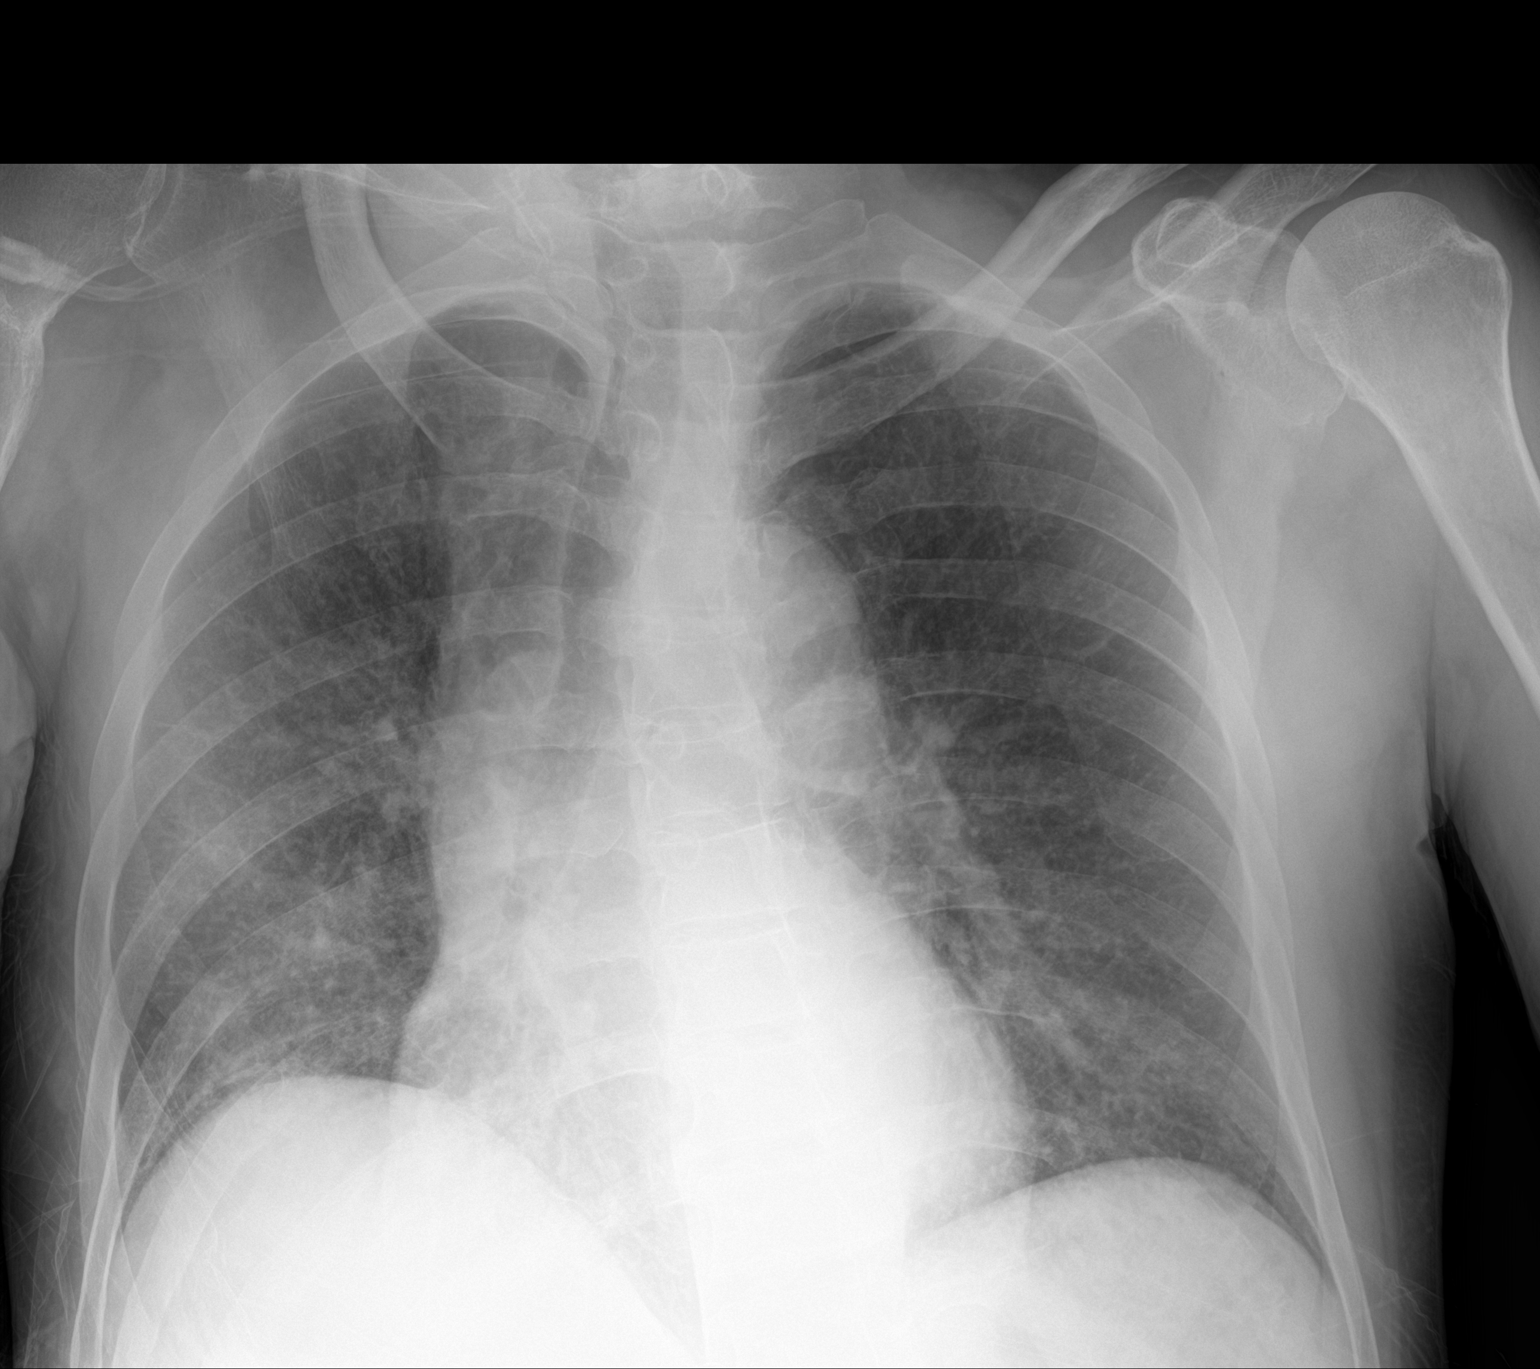

[1 of 1 positions shown; findings below may reference images not displayed]

FINDINGS: Grossly unchanged cardiac silhouette and mediastinal contours given
decreased lung volumes and patient rotation. Thickening of the right
paratracheal stripe is favored to be secondary to prominent
vasculature. Potential developing airspace opacity within the right
mid lung. No pleural effusion. No evidence of edema. Skin fold
overlies the right lung apex. No pneumothorax. No acute osseus
abnormalities.
IMPRESSION: Findings worrisome for developing right mid lung pneumonia on this
supine portable examination. Further evaluation with a PA and
lateral chest radiograph may be obtained as clinically indicated.
